# Patient Record
Sex: Male | Born: 1935 | Race: White | Hispanic: No | Smoking: Former smoker
Health system: Southern US, Community
[De-identification: ages and names within clinical notes are randomized; demographics above are authoritative.]

## PROBLEM LIST (undated history)

## (undated) DIAGNOSIS — K5790 Diverticulosis of intestine, part unspecified, without perforation or abscess without bleeding: Secondary | ICD-10-CM

## (undated) DIAGNOSIS — E039 Hypothyroidism, unspecified: Secondary | ICD-10-CM

## (undated) DIAGNOSIS — K635 Polyp of colon: Secondary | ICD-10-CM

## (undated) DIAGNOSIS — I10 Essential (primary) hypertension: Secondary | ICD-10-CM

## (undated) DIAGNOSIS — F0390 Unspecified dementia without behavioral disturbance: Secondary | ICD-10-CM

## (undated) DIAGNOSIS — E785 Hyperlipidemia, unspecified: Secondary | ICD-10-CM

## (undated) DIAGNOSIS — N400 Enlarged prostate without lower urinary tract symptoms: Secondary | ICD-10-CM

## (undated) HISTORY — DX: Hyperlipidemia, unspecified: E78.5

## (undated) HISTORY — PX: TONSILLECTOMY: SUR1361

## (undated) HISTORY — DX: Essential (primary) hypertension: I10

## (undated) HISTORY — PX: CATARACT EXTRACTION: SUR2

## (undated) HISTORY — PX: COLONOSCOPY: SHX174

## (undated) HISTORY — DX: Hypothyroidism, unspecified: E03.9

## (undated) HISTORY — DX: Diverticulosis of intestine, part unspecified, without perforation or abscess without bleeding: K57.90

## (undated) HISTORY — DX: Unspecified dementia, unspecified severity, without behavioral disturbance, psychotic disturbance, mood disturbance, and anxiety: F03.90

## (undated) HISTORY — DX: Benign prostatic hyperplasia without lower urinary tract symptoms: N40.0

## (undated) HISTORY — DX: Polyp of colon: K63.5

---

## 2000-02-04 ENCOUNTER — Ambulatory Visit (HOSPITAL_COMMUNITY): Admission: RE | Admit: 2000-02-04 | Discharge: 2000-02-04 | Payer: Self-pay | Admitting: Gastroenterology

## 2000-02-04 ENCOUNTER — Encounter (INDEPENDENT_AMBULATORY_CARE_PROVIDER_SITE_OTHER): Payer: Self-pay

## 2003-03-01 ENCOUNTER — Ambulatory Visit (HOSPITAL_COMMUNITY): Admission: RE | Admit: 2003-03-01 | Discharge: 2003-03-01 | Payer: Self-pay | Admitting: Gastroenterology

## 2006-03-06 ENCOUNTER — Ambulatory Visit: Payer: Self-pay | Admitting: Family Medicine

## 2006-07-03 ENCOUNTER — Ambulatory Visit: Payer: Self-pay | Admitting: Family Medicine

## 2006-07-03 LAB — CONVERTED CEMR LAB
ALT: 19 units/L (ref 0–40)
AST: 21 units/L (ref 0–37)
Chol/HDL Ratio, serum: 2.7
Cholesterol: 143 mg/dL (ref 0–200)
Glomerular Filtration Rate, Af Am: 93 mL/min/{1.73_m2}
Glucose, Bld: 101 mg/dL — ABNORMAL HIGH (ref 70–99)
HDL: 53.5 mg/dL (ref >39.0)

## 2006-08-18 ENCOUNTER — Ambulatory Visit: Payer: Self-pay | Admitting: Family Medicine

## 2006-08-21 ENCOUNTER — Ambulatory Visit (HOSPITAL_COMMUNITY): Admission: RE | Admit: 2006-08-21 | Discharge: 2006-08-21 | Payer: Self-pay | Admitting: Family Medicine

## 2006-08-21 ENCOUNTER — Ambulatory Visit: Payer: Self-pay | Admitting: Family Medicine

## 2006-08-22 ENCOUNTER — Encounter (INDEPENDENT_AMBULATORY_CARE_PROVIDER_SITE_OTHER): Payer: Self-pay | Admitting: *Deleted

## 2006-09-06 HISTORY — PX: PROSTATE SURGERY: SHX751

## 2006-09-09 ENCOUNTER — Encounter (INDEPENDENT_AMBULATORY_CARE_PROVIDER_SITE_OTHER): Payer: Self-pay | Admitting: Specialist

## 2006-09-09 ENCOUNTER — Encounter (INDEPENDENT_AMBULATORY_CARE_PROVIDER_SITE_OTHER): Payer: Self-pay | Admitting: *Deleted

## 2006-09-09 LAB — CONVERTED CEMR LAB: PSA: ABNORMAL ng/mL

## 2006-09-10 ENCOUNTER — Inpatient Hospital Stay (HOSPITAL_COMMUNITY): Admission: RE | Admit: 2006-09-10 | Discharge: 2006-09-12 | Payer: Self-pay | Admitting: Urology

## 2006-09-10 ENCOUNTER — Ambulatory Visit: Payer: Self-pay | Admitting: Pulmonary Disease

## 2006-10-30 ENCOUNTER — Ambulatory Visit: Payer: Self-pay | Admitting: Family Medicine

## 2006-10-30 LAB — CONVERTED CEMR LAB
ALT: 17 units/L (ref 0–40)
AST: 19 units/L (ref 0–37)
BUN: 21 mg/dL (ref 6–23)
CO2: 31 meq/L (ref 19–32)
Chloride: 101 meq/L (ref 96–112)
Cholesterol: 145 mg/dL (ref 0–200)
Creatinine, Ser: 1 mg/dL (ref 0.4–1.5)
GFR calc Af Amer: 93 mL/min
Glucose, Bld: 102 mg/dL — ABNORMAL HIGH (ref 70–99)
HDL: 55.2 mg/dL (ref >39.0)
Potassium: 4.1 meq/L (ref 3.5–5.1)
Sodium: 138 meq/L (ref 135–145)

## 2007-02-18 ENCOUNTER — Encounter (INDEPENDENT_AMBULATORY_CARE_PROVIDER_SITE_OTHER): Payer: Self-pay | Admitting: *Deleted

## 2007-02-26 ENCOUNTER — Ambulatory Visit: Payer: Self-pay | Admitting: Family Medicine

## 2007-02-26 DIAGNOSIS — I10 Essential (primary) hypertension: Secondary | ICD-10-CM

## 2007-02-26 DIAGNOSIS — E785 Hyperlipidemia, unspecified: Secondary | ICD-10-CM

## 2007-02-26 DIAGNOSIS — K644 Residual hemorrhoidal skin tags: Secondary | ICD-10-CM | POA: Insufficient documentation

## 2007-02-27 ENCOUNTER — Telehealth (INDEPENDENT_AMBULATORY_CARE_PROVIDER_SITE_OTHER): Payer: Self-pay | Admitting: *Deleted

## 2007-02-27 LAB — CONVERTED CEMR LAB
CO2: 33 meq/L — ABNORMAL HIGH (ref 19–32)
Calcium: 9.2 mg/dL (ref 8.4–10.5)
Creatinine, Ser: 1 mg/dL (ref 0.4–1.5)
GFR calc non Af Amer: 77 mL/min
Glucose, Bld: 73 mg/dL (ref 70–99)
Potassium: 4.3 meq/L (ref 3.5–5.1)
Sodium: 138 meq/L (ref 135–145)

## 2007-04-27 ENCOUNTER — Encounter (INDEPENDENT_AMBULATORY_CARE_PROVIDER_SITE_OTHER): Payer: Self-pay | Admitting: Family Medicine

## 2007-05-06 ENCOUNTER — Ambulatory Visit: Payer: Self-pay | Admitting: Family Medicine

## 2007-06-30 ENCOUNTER — Ambulatory Visit: Payer: Self-pay | Admitting: Family Medicine

## 2007-06-30 LAB — CONVERTED CEMR LAB
ALT: 20 units/L (ref 0–53)
Calcium: 9.3 mg/dL (ref 8.4–10.5)
Chloride: 99 meq/L (ref 96–112)
Creatinine, Ser: 1 mg/dL (ref 0.4–1.5)
Glucose, Bld: 99 mg/dL (ref 70–99)
LDL Cholesterol: 88 mg/dL (ref 0–99)
Potassium: 4.1 meq/L (ref 3.5–5.1)
Total CHOL/HDL Ratio: 2.8

## 2007-07-01 ENCOUNTER — Encounter (INDEPENDENT_AMBULATORY_CARE_PROVIDER_SITE_OTHER): Payer: Self-pay | Admitting: *Deleted

## 2007-09-14 ENCOUNTER — Ambulatory Visit: Payer: Self-pay | Admitting: Family Medicine

## 2007-10-26 ENCOUNTER — Encounter: Payer: Self-pay | Admitting: Family Medicine

## 2008-01-19 ENCOUNTER — Ambulatory Visit: Payer: Self-pay | Admitting: Family Medicine

## 2008-01-28 ENCOUNTER — Ambulatory Visit: Payer: Self-pay | Admitting: Family Medicine

## 2008-02-03 ENCOUNTER — Telehealth (INDEPENDENT_AMBULATORY_CARE_PROVIDER_SITE_OTHER): Payer: Self-pay | Admitting: *Deleted

## 2008-02-04 LAB — CONVERTED CEMR LAB
AST: 22 units/L (ref 0–37)
Albumin: 3.9 g/dL (ref 3.5–5.2)
Alkaline Phosphatase: 68 units/L (ref 39–117)
BUN: 23 mg/dL (ref 6–23)
GFR calc non Af Amer: 69 mL/min
Glucose, Bld: 98 mg/dL (ref 70–99)
LDL Cholesterol: 89 mg/dL (ref 0–99)
Potassium: 3.9 meq/L (ref 3.5–5.1)
Sodium: 136 meq/L (ref 135–145)
Triglycerides: 85 mg/dL (ref 0–149)

## 2008-03-11 ENCOUNTER — Ambulatory Visit: Payer: Self-pay | Admitting: Family Medicine

## 2008-04-14 ENCOUNTER — Encounter: Payer: Self-pay | Admitting: Family Medicine

## 2008-05-04 ENCOUNTER — Ambulatory Visit: Payer: Self-pay | Admitting: Family Medicine

## 2008-05-23 ENCOUNTER — Ambulatory Visit: Payer: Self-pay | Admitting: Family Medicine

## 2008-05-23 DIAGNOSIS — R972 Elevated prostate specific antigen [PSA]: Secondary | ICD-10-CM

## 2008-05-24 ENCOUNTER — Encounter (INDEPENDENT_AMBULATORY_CARE_PROVIDER_SITE_OTHER): Payer: Self-pay | Admitting: *Deleted

## 2008-05-24 LAB — CONVERTED CEMR LAB
ALT: 19 units/L (ref 0–53)
AST: 20 units/L (ref 0–37)
Alkaline Phosphatase: 57 units/L (ref 39–117)
BUN: 22 mg/dL (ref 6–23)
CO2: 30 meq/L (ref 19–32)
CRP, High Sensitivity: 2 (ref 0.00–5.00)
Calcium: 8.9 mg/dL (ref 8.4–10.5)
Creatinine, Ser: 1.1 mg/dL (ref 0.4–1.5)
GFR calc non Af Amer: 68 mL/min
Glucose, Bld: 93 mg/dL (ref 70–99)
PSA: 3.04 ng/mL (ref 0.10–4.00)
Potassium: 4.3 meq/L (ref 3.5–5.1)
Sodium: 138 meq/L (ref 135–145)
Total Bilirubin: 1.2 mg/dL (ref 0.3–1.2)
Triglycerides: 58 mg/dL (ref 0–149)
VLDL: 12 mg/dL (ref 0–40)

## 2008-07-13 ENCOUNTER — Telehealth: Payer: Self-pay | Admitting: Family Medicine

## 2008-07-18 HISTORY — PX: CATARACT EXTRACTION: SUR2

## 2008-10-17 ENCOUNTER — Ambulatory Visit: Payer: Self-pay | Admitting: Family Medicine

## 2008-10-17 DIAGNOSIS — J309 Allergic rhinitis, unspecified: Secondary | ICD-10-CM | POA: Insufficient documentation

## 2008-11-28 ENCOUNTER — Ambulatory Visit: Payer: Self-pay | Admitting: Family Medicine

## 2008-11-29 ENCOUNTER — Encounter (INDEPENDENT_AMBULATORY_CARE_PROVIDER_SITE_OTHER): Payer: Self-pay | Admitting: *Deleted

## 2008-11-29 LAB — CONVERTED CEMR LAB
Bilirubin, Direct: 0 mg/dL (ref 0.0–0.3)
Chloride: 103 meq/L (ref 96–112)
Cholesterol: 162 mg/dL (ref 0–200)
GFR calc non Af Amer: 68.34 mL/min (ref >60)
HDL: 51.2 mg/dL (ref >39.00)
LDL Cholesterol: 95 mg/dL (ref 0–99)
Potassium: 3.9 meq/L (ref 3.5–5.1)
Total Bilirubin: 1.3 mg/dL — ABNORMAL HIGH (ref 0.3–1.2)
Total Protein: 7 g/dL (ref 6.0–8.3)
Triglycerides: 80 mg/dL (ref 0.0–149.0)

## 2008-12-29 ENCOUNTER — Encounter: Payer: Self-pay | Admitting: Family Medicine

## 2008-12-29 LAB — HM COLONOSCOPY

## 2009-01-05 ENCOUNTER — Encounter: Payer: Self-pay | Admitting: Family Medicine

## 2009-04-13 ENCOUNTER — Encounter: Payer: Self-pay | Admitting: Family Medicine

## 2009-04-24 ENCOUNTER — Ambulatory Visit: Payer: Self-pay | Admitting: Family Medicine

## 2009-05-29 ENCOUNTER — Telehealth: Payer: Self-pay | Admitting: Family Medicine

## 2009-06-07 ENCOUNTER — Telehealth (INDEPENDENT_AMBULATORY_CARE_PROVIDER_SITE_OTHER): Payer: Self-pay | Admitting: *Deleted

## 2009-06-13 ENCOUNTER — Telehealth: Payer: Self-pay | Admitting: Family Medicine

## 2009-06-27 ENCOUNTER — Ambulatory Visit: Payer: Self-pay | Admitting: Family Medicine

## 2009-06-27 LAB — CONVERTED CEMR LAB
ALT: 21 U/L
AST: 24 U/L
Albumin: 3.8 g/dL
Alkaline Phosphatase: 63 U/L
BUN: 17 mg/dL
Bilirubin Urine: NEGATIVE
Bilirubin, Direct: 0.1 mg/dL
Blood in Urine, dipstick: NEGATIVE
CO2: 31 meq/L
Calcium: 9 mg/dL
Chloride: 99 meq/L
Cholesterol: 151 mg/dL
Creatinine, Ser: 1.1 mg/dL
GFR calc non Af Amer: 68.24 mL/min
Glucose, Bld: 98 mg/dL
Glucose, Urine, Semiquant: NEGATIVE
HDL: 55.2 mg/dL
Ketones, urine, test strip: NEGATIVE
LDL Cholesterol: 83 mg/dL
Nitrite: NEGATIVE
Potassium: 4 meq/L
Protein, U semiquant: NEGATIVE
Sodium: 139 meq/L
Specific Gravity, Urine: 1.015
Total Bilirubin: 1.1 mg/dL
Total CHOL/HDL Ratio: 3
Total Protein: 6.9 g/dL
Triglycerides: 63 mg/dL
Urobilinogen, UA: 0.2
VLDL: 12.6 mg/dL
WBC Urine, dipstick: NEGATIVE
pH: 6.5

## 2009-12-26 ENCOUNTER — Ambulatory Visit: Payer: Self-pay | Admitting: Family Medicine

## 2009-12-27 ENCOUNTER — Encounter: Payer: Self-pay | Admitting: Family Medicine

## 2009-12-27 LAB — CONVERTED CEMR LAB
Albumin: 4 g/dL (ref 3.5–5.2)
Basophils Absolute: 0 10*3/uL (ref 0.0–0.1)
Basophils Relative: 0.3 % (ref 0.0–3.0)
Bilirubin, Direct: 0.2 mg/dL (ref 0.0–0.3)
CO2: 31 meq/L (ref 19–32)
Calcium: 9.2 mg/dL (ref 8.4–10.5)
Chloride: 101 meq/L (ref 96–112)
Eosinophils Relative: 2.6 % (ref 0.0–5.0)
GFR calc non Af Amer: 75.21 mL/min (ref >60)
Hemoglobin: 16.2 g/dL (ref 13.0–17.0)
Lymphocytes Relative: 17.8 % (ref 12.0–46.0)
MCHC: 33.8 g/dL (ref 30.0–36.0)
MCV: 88.5 fL (ref 78.0–100.0)
Platelets: 185 10*3/uL (ref 150.0–400.0)
Sodium: 139 meq/L (ref 135–145)
Total Protein: 6.9 g/dL (ref 6.0–8.3)
Triglycerides: 64 mg/dL (ref 0.0–149.0)

## 2010-01-19 ENCOUNTER — Encounter: Payer: Self-pay | Admitting: Family Medicine

## 2010-05-22 ENCOUNTER — Encounter: Payer: Self-pay | Admitting: Family Medicine

## 2010-05-22 ENCOUNTER — Ambulatory Visit: Payer: Self-pay | Admitting: Family Medicine

## 2010-05-22 DIAGNOSIS — D239 Other benign neoplasm of skin, unspecified: Secondary | ICD-10-CM | POA: Insufficient documentation

## 2010-05-22 DIAGNOSIS — F329 Major depressive disorder, single episode, unspecified: Secondary | ICD-10-CM

## 2010-05-23 ENCOUNTER — Telehealth (INDEPENDENT_AMBULATORY_CARE_PROVIDER_SITE_OTHER): Payer: Self-pay | Admitting: *Deleted

## 2010-05-27 LAB — CONVERTED CEMR LAB
ALT: 19 units/L (ref 0–53)
AST: 21 units/L (ref 0–37)
Albumin: 4.2 g/dL (ref 3.5–5.2)
BUN: 20 mg/dL (ref 6–23)
Basophils Absolute: 0 10*3/uL (ref 0.0–0.1)
CO2: 28 meq/L (ref 19–32)
Eosinophils Absolute: 0.2 10*3/uL (ref 0.0–0.7)
Eosinophils Relative: 2.1 % (ref 0.0–5.0)
HCT: 47.8 % (ref 39.0–52.0)
HDL: 55.8 mg/dL (ref >39.00)
LDL Cholesterol: 112 mg/dL — ABNORMAL HIGH (ref 0–99)
Lymphocytes Relative: 20.1 % (ref 12.0–46.0)
MCV: 88.1 fL (ref 78.0–100.0)
Monocytes Absolute: 0.7 10*3/uL (ref 0.1–1.0)
Monocytes Relative: 7.7 % (ref 3.0–12.0)
Potassium: 4.2 meq/L (ref 3.5–5.1)
RBC: 5.43 M/uL (ref 4.22–5.81)
Total CHOL/HDL Ratio: 3
Triglycerides: 66 mg/dL (ref 0.0–149.0)
VLDL: 13.2 mg/dL (ref 0.0–40.0)

## 2010-05-29 ENCOUNTER — Encounter: Payer: Self-pay | Admitting: Family Medicine

## 2010-07-04 ENCOUNTER — Encounter: Payer: Self-pay | Admitting: Family Medicine

## 2010-07-10 ENCOUNTER — Ambulatory Visit
Admission: RE | Admit: 2010-07-10 | Discharge: 2010-07-10 | Payer: Self-pay | Source: Home / Self Care | Attending: Family Medicine | Admitting: Family Medicine

## 2010-07-23 ENCOUNTER — Encounter: Payer: Self-pay | Admitting: Family Medicine

## 2010-07-29 ENCOUNTER — Encounter: Payer: Self-pay | Admitting: Family Medicine

## 2010-08-07 NOTE — Miscellaneous (Signed)
Summary: BLUE MEDICARE  BLUE MEDICARE   Imported By: Freddy Jaksch 12/26/2009 10:09:33  _____________________________________________________________________  External Attachment:    Type:   Image     Comment:   External Document

## 2010-08-07 NOTE — Assessment & Plan Note (Signed)
Summary: yearly check/cbs   Vital Signs:  Patient profile:   77 year old male Height:      65.5 inches Weight:      209 pounds Temp:     98.0 degrees F oral Pulse rate:   76 / minute Resp:     18 per minute BP sitting:   150 / 86  (left arm)  Vitals Entered By: Jeremy Johann CMA (May 22, 2010 1:04 PM) CC: yearly, NPO since 7:30, flu shot  Does patient need assistance? Functional Status Self care, Cook/clean, Shopping, Social activities Ambulation Normal Comments No BP med today Pt is able to do all ADLS and can read and write.  Vision Screening:      Vision Comments: optho q1y 40db HL: Left  Right  Audiometry Comment: grossly normal    CC:  yearly, NPO since 7:30, and flu shot.  History of Present Illness: Pt here with his son for CPE.  No complaints.   Pt son is present.  Family feels that he is depressed.   He is withdrawing some.   optho--Dr Hazle [facility] urology--[facility]   Preventive Screening-Counseling & Management  Alcohol-Tobacco     Alcohol drinks/day: 1     Alcohol type: spirits     Smoking Status: quit > 6 months     Smoke Cessation Stage: quit     Packs/Day: 1.0     Year Started: 1942     Year Quit: 1964  Caffeine-Diet-Exercise     Caffeine use/day: 2.5     Does Patient Exercise: yes     Type of exercise: tennis, walk     Times/week: 2  Hep-HIV-STD-Contraception     Dental Visit-last 6 months yes     Dental Care Counseling: not indicated; dental care within six months  Safety-Violence-Falls     Firearms in the Home: no firearms in the home     Smoke Detectors: yes     Violence in the Home: no risk noted     Sexual Abuse: no     Fall Risk: no      Sexual History:  widow.    Current Medications (verified): 1)  Lipitor 10 Mg  Tabs (Atorvastatin Calcium) .Marland Kitchen.. 1 By Mouth Pm 2)  Hydrochlorothiazide 25 Mg  Tabs (Hydrochlorothiazide) .Marland Kitchen.. 1 Daily 3)  Adult Aspirin Ec Low Strength 81 Mg  Tbec (Aspirin) 4)  Zoloft 50 Mg Tabs (Sertraline  Hcl) .Marland Kitchen.. 1 By Mouth Once Daily  Allergies (verified): No Known Drug Allergies  Past History:  Past Medical History: Last updated: 05/23/2008 Hyperlipidemia Hypertension Current Problems:  HEMORRHOIDS, EXTERNAL (ICD-455.3) HYPERTENSION (ICD-401.9) HYPERLIPIDEMIA (ICD-272.4)  Past Surgical History: Last updated: 10/17/2008 Prostatectomy (3/08) Cataract extraction (07/18/2008)--- Left  Social History: Last updated: 12/26/2009  Widow/Widower  Risk Factors: Alcohol Use: 1 (05/22/2010) Caffeine Use: 2.5 (05/22/2010) Exercise: yes (05/22/2010)  Risk Factors: Smoking Status: quit > 6 months (05/22/2010) Packs/Day: 1.0 (05/22/2010)  Social History: Caffeine use/day:  2.5 Dental Care w/in 6 mos.:  yes Fall Risk:  no  Review of Systems      See HPI General:  Denies chills, fatigue, fever, loss of appetite, malaise, sleep disorder, sweats, weakness, and weight loss. Eyes:  Denies blurring, discharge, double vision, eye irritation, eye pain, halos, itching, light sensitivity, red eye, vision loss-1 eye, and vision loss-both eyes. ENT:  Denies decreased hearing, difficulty swallowing, ear discharge, earache, hoarseness, nasal congestion, nosebleeds, postnasal drainage, ringing in ears, sinus pressure, and sore throat. CV:  Denies bluish discoloration of lips or  nails, chest pain or discomfort, difficulty breathing at night, difficulty breathing while lying down, fainting, fatigue, leg cramps with exertion, lightheadness, near fainting, palpitations, shortness of breath with exertion, swelling of feet, swelling of hands, and weight gain. Resp:  Denies chest discomfort, chest pain with inspiration, cough, coughing up blood, excessive snoring, hypersomnolence, morning headaches, pleuritic, shortness of breath, sputum productive, and wheezing. GI:  Denies abdominal pain, bloody stools, change in bowel habits, constipation, dark tarry stools, diarrhea, excessive appetite, gas,  hemorrhoids, indigestion, loss of appetite, nausea, vomiting, vomiting blood, and yellowish skin color. GU:  Denies decreased libido, discharge, dysuria, erectile dysfunction, genital sores, hematuria, incontinence, nocturia, urinary frequency, and urinary hesitancy. MS:  Denies joint pain, joint redness, joint swelling, loss of strength, low back pain, mid back pain, muscle aches, muscle , cramps, muscle weakness, stiffness, and thoracic pain. Derm:  Denies changes in color of skin, changes in nail beds, dryness, excessive perspiration, flushing, hair loss, insect bite(s), itching, lesion(s), poor wound healing, and rash. Neuro:  Denies brief paralysis, difficulty with concentration, disturbances in coordination, falling down, headaches, inability to speak, memory loss, numbness, poor balance, seizures, sensation of room spinning, tingling, tremors, visual disturbances, and weakness. Psych:  Complains of depression; denies alternate hallucination ( auditory/visual), anxiety, easily angered, easily tearful, irritability, mental problems, panic attacks, sense of great danger, suicidal thoughts/plans, thoughts of violence, unusual visions or sounds, and thoughts /plans of harming others. Endo:  Denies cold intolerance, excessive hunger, excessive thirst, excessive urination, heat intolerance, polyuria, and weight change. Heme:  Denies abnormal bruising, bleeding, enlarge lymph nodes, fevers, pallor, and skin discoloration. Allergy:  Denies hives or rash, itching eyes, persistent infections, seasonal allergies, and sneezing.  Physical Exam  General:  Well-developed,well-nourished,in no acute distress; alert,appropriate and cooperative throughout examination Head:  Normocephalic and atraumatic without obvious abnormalities. No apparent alopecia or balding. Eyes:  vision grossly intact, pupils equal, pupils round, pupils reactive to light, and no injection.   Ears:  ? keratosis R ear  Nose:  External  nasal examination shows no deformity or inflammation. Nasal mucosa are pink and moist without lesions or exudates. Mouth:  Oral mucosa and oropharynx without lesions or exudates.  Teeth in good repair. Neck:  No deformities, masses, or tenderness noted. Chest Wall:  No deformities, masses, tenderness or gynecomastia noted. Lungs:  Normal respiratory effort, chest expands symmetrically. Lungs are clear to auscultation, no crackles or wheezes. Heart:  normal rate and no murmur.   Abdomen:  small ventral hernia soft.   Rectal:  urology Genitalia:  urology Prostate:  urology Msk:  normal ROM, no joint tenderness, no joint swelling, no joint warmth, no redness over joints, no joint deformities, no joint instability, and no crepitation.   Pulses:  R and L carotid,radial,femoral,dorsalis pedis and posterior tibial pulses are full and equal bilaterally Extremities:  No clubbing, cyanosis, edema, or deformity noted with normal full range of motion of all joints.   Neurologic:  No cranial nerve deficits noted. Station and gait are normal. Plantar reflexes are down-going bilaterally. DTRs are symmetrical throughout. Sensory, motor and coordinative functions appear intact. Skin:  Intact without suspicious lesions or rashes Cervical Nodes:  No lymphadenopathy noted Axillary Nodes:  No palpable lymphadenopathy Psych:  Cognition and judgment appear intact. Alert and cooperative with normal attention span and concentration. No apparent delusions, illusions, hallucinations   Impression & Recommendations:  Problem # 1:  PREVENTIVE HEALTH CARE (ICD-V70.0)  Orders: Venipuncture (78295) TLB-Lipid Panel (80061-LIPID) TLB-BMP (Basic Metabolic Panel-BMET) (80048-METABOL) TLB-CBC Platelet -  w/Differential (85025-CBCD) TLB-Hepatic/Liver Function Pnl (80076-HEPATIC) TLB-PSA (Prostate Specific Antigen) (84153-PSA) Specimen Handling (57846) Medicare -1st Annual Wellness Visit [phone]) EKG w/ Interpretation  (93000)  Reviewed preventive care protocols, scheduled due services, and updated immunizations.  Problem # 2:  MOLE (ICD-216.9)  Orders: Dermatology Referral (Derma)  Problem # 3:  HYPERTENSION (ICD-401.9)  His updated medication list for this problem includes:    Hydrochlorothiazide 25 Mg Tabs (Hydrochlorothiazide) .Marland Kitchen... 1 daily  Orders: Venipuncture (28413) TLB-Lipid Panel (80061-LIPID) TLB-BMP (Basic Metabolic Panel-BMET) (80048-METABOL) TLB-CBC Platelet - w/Differential (85025-CBCD) TLB-Hepatic/Liver Function Pnl (80076-HEPATIC) TLB-PSA (Prostate Specific Antigen) (84153-PSA) Specimen Handling (24401)  BP today: 150/86 Prior BP: 124/80 (12/26/2009)  Labs Reviewed: K+: 4.5 (12/26/2009) Creat: : 1.0 (12/26/2009)   Chol: 157 (12/26/2009)   HDL: 55.50 (12/26/2009)   LDL: 89 (12/26/2009)   TG: 64.0 (12/26/2009)  Problem # 4:  HYPERLIPIDEMIA (ICD-272.4)  His updated medication list for this problem includes:    Lipitor 10 Mg Tabs (Atorvastatin calcium) .Marland Kitchen... 1 by mouth pm  Orders: Venipuncture (02725) TLB-Lipid Panel (80061-LIPID) TLB-BMP (Basic Metabolic Panel-BMET) (80048-METABOL) TLB-CBC Platelet - w/Differential (85025-CBCD) TLB-Hepatic/Liver Function Pnl (80076-HEPATIC) TLB-PSA (Prostate Specific Antigen) (84153-PSA) Specimen Handling (36644)  Labs Reviewed: SGOT: 20 (12/26/2009)   SGPT: 21 (12/26/2009)   HDL:55.50 (12/26/2009), 55.20 (06/27/2009)  LDL:89 (12/26/2009), 83 (06/27/2009)  Chol:157 (12/26/2009), 151 (06/27/2009)  Trig:64.0 (12/26/2009), 63.0 (06/27/2009)  Problem # 5:  PSA, INCREASED (ICD-790.93) per urology Orders: Venipuncture (03474) TLB-Lipid Panel (80061-LIPID) TLB-BMP (Basic Metabolic Panel-BMET) (80048-METABOL) TLB-CBC Platelet - w/Differential (85025-CBCD) TLB-Hepatic/Liver Function Pnl (80076-HEPATIC) TLB-PSA (Prostate Specific Antigen) (84153-PSA) Specimen Handling (25956)  Problem # 6:  DEPRESSIVE DISORDER (ICD-311)  His updated  medication list for this problem includes:    Zoloft 50 Mg Tabs (Sertraline hcl) .Marland Kitchen... 1 by mouth once daily  Complete Medication List: 1)  Lipitor 10 Mg Tabs (Atorvastatin calcium) .Marland Kitchen.. 1 by mouth pm 2)  Hydrochlorothiazide 25 Mg Tabs (Hydrochlorothiazide) .Marland Kitchen.. 1 daily 3)  Adult Aspirin Ec Low Strength 81 Mg Tbec (Aspirin) 4)  Zoloft 50 Mg Tabs (Sertraline hcl) .Marland Kitchen.. 1 by mouth once daily  Other Orders: Flu Vaccine 86yrs + MEDICARE PATIENTS (L8756) Administration Flu vaccine - MCR (E3329)  Patient Instructions: 1)  take 1/2 zoloft daily for 8 days then go up to 1 tab daily 2)  f/u here 4-6 weeks  Prescriptions: ZOLOFT 50 MG TABS (SERTRALINE HCL) 1 by mouth once daily  #30 x 2   Entered and Authorized by:   Loreen Freud DO   Signed by:   Loreen Freud DO on 05/22/2010   Method used:   Electronically to        [facility] [address] (retail)       [address]       [place], [place]  [address]       Ph: [phone] or [phone]       Fax: [phone]   RxID:   [id number]    Orders Added: 1)  Flu Vaccine 48yrs + MEDICARE PATIENTS [Q2039] 2)  Administration Flu vaccine - MCR [G0008] 3)  Venipuncture [36415] 4)  TLB-Lipid Panel [80061-LIPID] 5)  TLB-BMP (Basic Metabolic Panel-BMET) [80048-METABOL] 6)  TLB-CBC Platelet - w/Differential [85025-CBCD] 7)  TLB-Hepatic/Liver Function Pnl [80076-HEPATIC] 8)  TLB-PSA (Prostate Specific Antigen) [37106-YIR] 9)  Dermatology Referral [Derma] 10)  Specimen Handling [99000] 11)  Medicare -1st Annual Wellness Visit [G0438] 12)  EKG w/ Interpretation [93000] 13)  Est. Patient Level III [48546]  Flu Vaccine Consent Questions     Do you have a history of severe allergic reactions to this vaccine? no    Any prior history of allergic reactions to egg and/or gelatin? no    Do you have a sensitivity to the preservative Thimersol? no    Do you have a past history of Guillan-Barre Syndrome? no    Do you currently  have an acute febrile illness? no    Have you ever had a severe reaction to latex? no    Vaccine information given and explained to patient? yes    Are you currently pregnant? no    Lot Number:[id number]   Exp Date:01/05/2011   Manufacturer: [employer]    Site Given  Left Deltoid IMded: 1)  Flu Vaccine 33yrs + MEDICARE PATIENTS [Q2039] 2)  Administration Flu vaccine - MCR [G0008]   .medflu  Pneumovax Result Date:  04/24/1999 Pneumovax Result:  given Pneumovax Next Due:  Not Indicated Last Colonoscopy:  Diverticulosis (03/01/2003 9:21:54 AM) Colonoscopy Result Date:  12/29/2008 Colonoscopy Result:  polyps Colonoscopy Next Due:  5 yr  Appended Document: yearly check/cbs BMI- 34.24

## 2010-08-07 NOTE — Letter (Signed)
Summary: Letter to Patient Regarding Path Results/[facility]   Letter to Patient Regarding Path Results/[facility]   Imported By: Lanelle Bal 06/01/2010 10:37:16  _____________________________________________________________________  External Attachment:    Type:   Image     Comment:   External Document

## 2010-08-07 NOTE — Letter (Signed)
Summary: [facility]  [facility]   Imported By: Lanelle Bal 06/01/2010 10:38:08  _____________________________________________________________________  External Attachment:    Type:   Image     Comment:   External Document

## 2010-08-07 NOTE — Progress Notes (Signed)
Summary: Pt does not want to go to Dermatologist  Phone Note Call from Patient   Caller: Patient Call For: Loreen Freud DO Details for Reason: Pt does not want Dermatology Referral Summary of Call: Mssg from patient stating he did not want to see the dermatologist, says he did not want to do anything about the blotches on his face. C/B # [id number].... Almeta Monas CMA Duncan Dull)  May 23, 2010 5:04 PM   Initial call taken by: Almeta Monas CMA Duncan Dull),  May 23, 2010 5:05 PM  Follow-up for Phone Call        I was concerned about the one on his ear and felt a derm should look at it. Follow-up by: Loreen Freud DO,  May 24, 2010 8:59 AM  Additional Follow-up for Phone Call Additional follow up Details #1::        spoke w/ patient says that he has appt information and he is going to see dermatology.....Marland KitchenMarland KitchenDoristine Devoid CMA  May 24, 2010 5:00 PM

## 2010-08-07 NOTE — Miscellaneous (Signed)
Summary: Zostavax/[facility]  [facility]   Imported By: Lanelle Bal 01/04/2010 12:59:48  _____________________________________________________________________  External Attachment:    Type:   Image     Comment:   External Document  Appended Document: [facility]    Clinical Lists Changes  Observations: Added new observation of ZOSTAVAX: Zostavax (12/27/2009 9:21)       Immunization History:  Zostavax History:    Zostavax # 1:  Zostavax (12/27/2009)

## 2010-08-07 NOTE — Letter (Signed)
Summary: [facility]  [facility]   Imported By: Lanelle Bal 01/25/2010 12:31:14  _____________________________________________________________________  External Attachment:    Type:   Image     Comment:   External Document

## 2010-08-07 NOTE — Assessment & Plan Note (Signed)
Summary: 6 MTH FU/NS/KDC   Vital Signs:  Patient profile:   77 year old male Height:      65 inches Weight:      207 pounds BMI:     34.57 Pulse rate:   64 / minute Pulse rhythm:   regular BP sitting:   124 / 80  (left arm) Cuff size:   large  Vitals Entered By: Army Fossa CMA (December 26, 2009 8:10 AM) CC: Pt here for 6 month follow up   History of Present Illness:  Hyperlipidemia follow-up      This is an 77 year old man who presents for Hyperlipidemia follow-up.  The patient denies muscle aches, GI upset, abdominal pain, flushing, itching, constipation, diarrhea, and fatigue.  The patient denies the following symptoms: chest pain/pressure, exercise intolerance, dypsnea, palpitations, syncope, and pedal edema.  Compliance with medications (by patient report) has been near 100%.  Dietary compliance has been good.  The patient reports exercising 3-4X per week.  Adjunctive measures currently used by the patient include ASA.    Hypertension follow-up      The patient also presents for Hypertension follow-up.  The patient denies lightheadedness, urinary frequency, headaches, edema, impotence, rash, and fatigue.  The patient denies the following associated symptoms: chest pain, chest pressure, exercise intolerance, dyspnea, palpitations, syncope, leg edema, and pedal edema.  Compliance with medications (by patient report) has been near 100%.  The patient reports that dietary compliance has been good.  The patient reports exercising 3-4X per week.  Adjunctive measures currently used by the patient include salt restriction.    Preventive Screening-Counseling & Management  Alcohol-Tobacco     Smoking Status: quit > 6 months     Smoke Cessation Stage: quit     Packs/Day: 1.0     Year Started: 1942     Year Quit: 1964  Caffeine-Diet-Exercise     Does Patient Exercise: yes     Type of exercise: tennis, walk     Times/week: 3      Sexual History:  widow.    Current Medications  (verified): 1)  Lipitor 10 Mg  Tabs (Atorvastatin Calcium) .Marland Kitchen.. 1 By Mouth Pm 2)  Hydrochlorothiazide 25 Mg  Tabs (Hydrochlorothiazide) .Marland Kitchen.. 1 Daily 3)  Adult Aspirin Ec Low Strength 81 Mg  Tbec (Aspirin) 4)  Zostavax 04540 Unt/0.15ml Solr (Zoster Vaccine Live) .Marland Kitchen.. 1 Ml Im X1  Allergies (verified): No Known Drug Allergies  Past History:  Past medical, surgical, family and social histories (including risk factors) reviewed for relevance to current acute and chronic problems.  Past Medical History: Reviewed history from 05/23/2008 and no changes required. Hyperlipidemia Hypertension Current Problems:  HEMORRHOIDS, EXTERNAL (ICD-455.3) HYPERTENSION (ICD-401.9) HYPERLIPIDEMIA (ICD-272.4)  Past Surgical History: Reviewed history from 10/17/2008 and no changes required. Prostatectomy (3/08) Cataract extraction (07/18/2008)--- Left  Family History: Reviewed history and no changes required.  Social History: Reviewed history from 05/23/2008 and no changes required.  Widow/Widower Does Patient Exercise:  yes Smoking Status:  quit > 6 months Packs/Day:  1.0 Sexual History:  widow  Review of Systems      See HPI  Physical Exam  General:  Well-developed,well-nourished,in no acute distress; alert,appropriate and cooperative throughout examination Lungs:  Normal respiratory effort, chest expands symmetrically. Lungs are clear to auscultation, no crackles or wheezes. Heart:  normal rate and no murmur.   Extremities:  No clubbing, cyanosis, edema, or deformity noted with normal full range of motion of all joints.   Psych:  Oriented X3  and normally interactive.     Impression & Recommendations:  Problem # 1:  HYPERTENSION (ICD-401.9)  His updated medication list for this problem includes:    Hydrochlorothiazide 25 Mg Tabs (Hydrochlorothiazide) .Marland Kitchen... 1 daily  Orders: Venipuncture (16109) TLB-Lipid Panel (80061-LIPID) TLB-BMP (Basic Metabolic Panel-BMET)  (80048-METABOL) TLB-CBC Platelet - w/Differential (85025-CBCD) TLB-Hepatic/Liver Function Pnl (80076-HEPATIC)  BP today: 124/80 Prior BP: 122/80 (06/27/2009)  Labs Reviewed: K+: 4.0 (06/27/2009) Creat: : 1.1 (06/27/2009)   Chol: 151 (06/27/2009)   HDL: 55.20 (06/27/2009)   LDL: 83 (06/27/2009)   TG: 63.0 (06/27/2009)  Problem # 2:  HYPERLIPIDEMIA (ICD-272.4)  His updated medication list for this problem includes:    Lipitor 10 Mg Tabs (Atorvastatin calcium) .Marland Kitchen... 1 by mouth pm  Orders: Venipuncture (60454) TLB-Lipid Panel (80061-LIPID) TLB-BMP (Basic Metabolic Panel-BMET) (80048-METABOL) TLB-CBC Platelet - w/Differential (85025-CBCD) TLB-Hepatic/Liver Function Pnl (80076-HEPATIC)  Labs Reviewed: SGOT: 24 (06/27/2009)   SGPT: 21 (06/27/2009)   HDL:55.20 (06/27/2009), 51.20 (11/28/2008)  LDL:83 (06/27/2009), 95 (11/28/2008)  Chol:151 (06/27/2009), 162 (11/28/2008)  Trig:63.0 (06/27/2009), 80.0 (11/28/2008)  Problem # 3:  PSA, INCREASED (ICD-790.93) per urology  Complete Medication List: 1)  Lipitor 10 Mg Tabs (Atorvastatin calcium) .Marland Kitchen.. 1 by mouth pm 2)  Hydrochlorothiazide 25 Mg Tabs (Hydrochlorothiazide) .Marland Kitchen.. 1 daily 3)  Adult Aspirin Ec Low Strength 81 Mg Tbec (Aspirin) 4)  Zostavax 09811 Unt/0.27ml Solr (Zoster vaccine live) .Marland Kitchen.. 1 ml im x1  Patient Instructions: 1)  Please schedule a follow-up appointment in 6 months for complete medicare physical Prescriptions: ZOSTAVAX 91478 UNT/0.65ML SOLR (ZOSTER VACCINE LIVE) 1 ml IM x1  #1 x 0   Entered and Authorized by:   Loreen Freud DO   Signed by:   Loreen Freud DO on 12/26/2009   Method used:   Print then Give to Patient   RxID:   [id number]

## 2010-08-09 NOTE — Letter (Signed)
Summary: [facility]  [facility]   Imported By: Lanelle Bal 08/03/2010 11:53:04  _____________________________________________________________________  External Attachment:    Type:   Image     Comment:   External Document

## 2010-08-09 NOTE — Consult Note (Signed)
Summary: Duard Larsen MD Dermatology  Duard Larsen MD Dermatology   Imported By: Lanelle Bal 07/17/2010 08:59:34  _____________________________________________________________________  External Attachment:    Type:   Image     Comment:   External Document

## 2010-08-09 NOTE — Letter (Signed)
Summary: Duard Larsen MD Dermatology  Duard Larsen MD Dermatology   Imported By: Lanelle Bal 07/17/2010 09:00:42  _____________________________________________________________________  External Attachment:    Type:   Image     Comment:   External Document

## 2010-08-09 NOTE — Assessment & Plan Note (Signed)
Summary: rto 6 weeks/cbs   Vital Signs:  Patient profile:   77 year old male Height:      65.5 inches Weight:      210 pounds Temp:     98.3 degrees F oral Pulse rate:   72 / minute BP sitting:   150 / 82  (left arm)  Vitals Entered By: Jeremy Johann CMA (July 10, 2010 1:03 PM) CC: f/u med   History of Present Illness: pt here to f/u zoloft.  Pt doesn't seem to notice a difference.  He is doing a little more now.  Pt still plays tennis 1x a week.  He has not been back to nursing home since his wife died.  They asked to see him but he doesn't think he can do that.    Current Medications (verified): 1)  Lipitor 10 Mg  Tabs (Atorvastatin Calcium) .Marland Kitchen.. 1 By Mouth Pm 2)  Hydrochlorothiazide 25 Mg  Tabs (Hydrochlorothiazide) .Marland Kitchen.. 1 Daily 3)  Adult Aspirin Ec Low Strength 81 Mg  Tbec (Aspirin) 4)  Zoloft 50 Mg Tabs (Sertraline Hcl) .Marland Kitchen.. 1 By Mouth Once Daily  Allergies (verified): No Known Drug Allergies  Past History:  Past Medical History: Last updated: 05/23/2008 Hyperlipidemia Hypertension Current Problems:  HEMORRHOIDS, EXTERNAL (ICD-455.3) HYPERTENSION (ICD-401.9) HYPERLIPIDEMIA (ICD-272.4)  Past Surgical History: Last updated: 10/17/2008 Prostatectomy (3/08) Cataract extraction (07/18/2008)--- Left  Social History: Last updated: 12/26/2009  Widow/Widower  Risk Factors: Alcohol Use: 1 (05/22/2010) Caffeine Use: 2.5 (05/22/2010) Exercise: yes (05/22/2010)  Risk Factors: Smoking Status: quit > 6 months (05/22/2010) Packs/Day: 1.0 (05/22/2010)  Family History: Reviewed history and no changes required.  Social History: Reviewed history from 12/26/2009 and no changes required.  Widow/Widower  Review of Systems      See HPI  Physical Exam  General:  Well-developed,well-nourished,in no acute distress; alert,appropriate and cooperative throughout examination Psych:  Oriented X3, normally interactive, good eye contact, not anxious appearing, and not  depressed appearing.     Impression & Recommendations:  Problem # 1:  DEPRESSIVE DISORDER (ICD-311) Assessment Unchanged  His updated medication list for this problem includes:    Zoloft 50 Mg Tabs (Sertraline hcl) .Marland Kitchen... 1 by mouth once daily con't med for now---reevaluate in another 6-8 weeks.    Complete Medication List: 1)  Lipitor 10 Mg Tabs (Atorvastatin calcium) .Marland Kitchen.. 1 by mouth pm 2)  Hydrochlorothiazide 25 Mg Tabs (Hydrochlorothiazide) .Marland Kitchen.. 1 daily 3)  Adult Aspirin Ec Low Strength 81 Mg Tbec (Aspirin) 4)  Zoloft 50 Mg Tabs (Sertraline hcl) .Marland Kitchen.. 1 by mouth once daily  Patient Instructions: 1)  Please schedule a follow-up appointment in 2 months --- or sooner as needed    Orders Added: 1)  Est. Patient Level III [81191]

## 2010-09-10 ENCOUNTER — Telehealth (INDEPENDENT_AMBULATORY_CARE_PROVIDER_SITE_OTHER): Payer: Self-pay | Admitting: *Deleted

## 2010-09-10 ENCOUNTER — Encounter: Payer: Self-pay | Admitting: Family Medicine

## 2010-09-10 ENCOUNTER — Ambulatory Visit (INDEPENDENT_AMBULATORY_CARE_PROVIDER_SITE_OTHER): Payer: Medicare Other | Admitting: Family Medicine

## 2010-09-10 DIAGNOSIS — F329 Major depressive disorder, single episode, unspecified: Secondary | ICD-10-CM

## 2010-09-18 NOTE — Assessment & Plan Note (Signed)
Summary: 2 MONTH FOLLOWUP///[facility]   Vital Signs:  Patient profile:   77 year old male Weight:      209.2 pounds Pulse rate:   72 / minute Pulse rhythm:   regular BP sitting:   122 / 66  (left arm) Cuff size:   large  Vitals Entered By: Almeta Monas CMA Duncan Dull) (September 10, 2010 10:28 AM) CC: 2 mo med f/u   History of Present Illness: Pt here for f/u zoloft.  Pt thinks he is doing better but really doesn't know if he notices a big difference.  Pt is still playing tennis but wishes he could more around the house.  He remains active with exercise bike, tennis and walking.      Current Medications (verified): 1)  Lipitor 10 Mg  Tabs (Atorvastatin Calcium) .Marland Kitchen.. 1 By Mouth Pm 2)  Hydrochlorothiazide 25 Mg  Tabs (Hydrochlorothiazide) .Marland Kitchen.. 1 Daily 3)  Adult Aspirin Ec Low Strength 81 Mg  Tbec (Aspirin) 4)  Zoloft 50 Mg Tabs (Sertraline Hcl) .Marland Kitchen.. 1 By Mouth Once Daily  Allergies (verified): No Known Drug Allergies  Past History:  Past medical, surgical, family and social histories (including risk factors) reviewed for relevance to current acute and chronic problems.  Past Medical History: Reviewed history from 05/23/2008 and no changes required. Hyperlipidemia Hypertension Current Problems:  HEMORRHOIDS, EXTERNAL (ICD-455.3) HYPERTENSION (ICD-401.9) HYPERLIPIDEMIA (ICD-272.4)  Past Surgical History: Reviewed history from 10/17/2008 and no changes required. Prostatectomy (3/08) Cataract extraction (07/18/2008)--- Left  Family History: Reviewed history and no changes required.  Social History: Reviewed history from 12/26/2009 and no changes required.  Widow/Widower  Review of Systems      See HPI  Physical Exam  General:  Well-developed,well-nourished,in no acute distress; alert,appropriate and cooperative throughout examination Psych:  Oriented X3, normally interactive, and good eye contact.     Impression & Recommendations:  Problem # 1:  DEPRESSIVE DISORDER  (ICD-311) pt was not taking med everyday----instructed pt to take med every day His updated medication list for this problem includes:    Zoloft 50 Mg Tabs (Sertraline hcl) .Marland Kitchen... 1 by mouth once daily  Complete Medication List: 1)  Lipitor 10 Mg Tabs (Atorvastatin calcium) .Marland Kitchen.. 1 by mouth pm 2)  Hydrochlorothiazide 25 Mg Tabs (Hydrochlorothiazide) .Marland Kitchen.. 1 daily 3)  Adult Aspirin Ec Low Strength 81 Mg Tbec (Aspirin) 4)  Zoloft 50 Mg Tabs (Sertraline hcl) .Marland Kitchen.. 1 by mouth once daily 5)  Finasteride 5 Mg Tabs (Finasteride) .Marland Kitchen.. 1 by mouth 4x a week  Patient Instructions: 1)  Please schedule a follow-up appointment in 1 month.    2)  Take zoloft every day.    Orders Added: 1)  Est. Patient Level III [08657]

## 2010-09-18 NOTE — Progress Notes (Signed)
Summary: refill  Phone Note Refill Request Message from:  Patient  Refills Requested: Medication #1:  ZOLOFT 50 MG TABS 1 by mouth once daily [facility]  Initial call taken by: Jeremy Johann CMA,  September 10, 2010 11:20 AM    Prescriptions: ZOLOFT 50 MG TABS (SERTRALINE HCL) 1 by mouth once daily  #90 x 0   Entered by:   Jeremy Johann CMA   Authorized by:   Loreen Freud DO   Signed by:   Jeremy Johann CMA on 09/10/2010   Method used:   Faxed to ...       [facility]* (retail)             ,          Ph: [phone]       Fax: [phone]   RxID:   [id number]

## 2010-09-19 ENCOUNTER — Encounter: Payer: Self-pay | Admitting: Family Medicine

## 2010-10-11 ENCOUNTER — Ambulatory Visit (INDEPENDENT_AMBULATORY_CARE_PROVIDER_SITE_OTHER): Payer: Medicare Other | Admitting: Family Medicine

## 2010-10-11 ENCOUNTER — Encounter: Payer: Self-pay | Admitting: Family Medicine

## 2010-10-11 VITALS — BP 122/74 | HR 60 | Temp 99.3°F | Wt 206.2 lb

## 2010-10-11 DIAGNOSIS — F329 Major depressive disorder, single episode, unspecified: Secondary | ICD-10-CM

## 2010-10-11 DIAGNOSIS — J069 Acute upper respiratory infection, unspecified: Secondary | ICD-10-CM

## 2010-10-11 MED ORDER — SERTRALINE HCL 50 MG PO TABS: 100.0000 mg | ORAL_TABLET | Freq: Every day | ORAL | Status: DC

## 2010-10-11 NOTE — Assessment & Plan Note (Signed)
Increase zoloft to 100 List of psych given to son

## 2010-10-11 NOTE — Assessment & Plan Note (Signed)
Resolved If reoccurs he can use saline spray and otc antihistamine---no decongestants

## 2010-10-11 NOTE — Progress Notes (Signed)
  Subjective:    Patient ID: Louis Patel, male    DOB: 1926-02-14, 77 y.o.   MRN: [id number]  HPI Pt here with son f/u depression.  His son does not think the medication is doing anything.  His memory seems to be going.  He remembers details about his finances and has good .  Pt has trouble focusing and doesn't talk as much as he used to in a group.  Pt son feels he is still depressed.     Pt also c/o cough and congestion. Better now -- congested last week.  Nothing is really bothering him now.    Review of Systems  Constitutional: Positive for activity change. Negative for fever, chills, diaphoresis, appetite change, fatigue and unexpected weight change.  HENT: Negative for congestion, rhinorrhea, sneezing and postnasal drip.   Respiratory: Positive for cough (cough now resolved).   Psychiatric/Behavioral: Positive for dysphoric mood and decreased concentration. Negative for hallucinations, behavioral problems and agitation.       + memory problems  All other systems reviewed and are negative.       Objective:   Physical Exam  Constitutional: He appears well-developed and well-nourished.  Musculoskeletal: Normal range of motion. He exhibits no edema and no tenderness.  Psychiatric:       Flat affect although improved from last visit Not suicidal    MMSE  30/30        Assessment & Plan:

## 2010-10-15 ENCOUNTER — Other Ambulatory Visit: Payer: Self-pay | Admitting: *Deleted

## 2010-10-15 MED ORDER — ATORVASTATIN CALCIUM 10 MG PO TABS: 10.0000 mg | ORAL_TABLET | Freq: Every day | ORAL | Status: DC

## 2010-11-12 ENCOUNTER — Encounter: Payer: Self-pay | Admitting: Family Medicine

## 2010-11-12 ENCOUNTER — Ambulatory Visit (INDEPENDENT_AMBULATORY_CARE_PROVIDER_SITE_OTHER): Payer: Medicare Other | Admitting: Family Medicine

## 2010-11-12 VITALS — BP 108/70 | HR 60 | Wt 203.8 lb

## 2010-11-12 DIAGNOSIS — F329 Major depressive disorder, single episode, unspecified: Secondary | ICD-10-CM

## 2010-11-12 MED ORDER — SERTRALINE HCL 100 MG PO TABS: 100.0000 mg | ORAL_TABLET | Freq: Every day | ORAL | Status: DC

## 2010-11-12 NOTE — Assessment & Plan Note (Signed)
Take zoloft 100mg  . This was d/w pt F/u 2 months or sooner prn

## 2010-11-12 NOTE — Progress Notes (Signed)
  Subjective:    Patient ID: Louis Patel, male    DOB: 1926/06/28, 77 y.o.   MRN: [id number]  HPI Pt here f/u depression.  His daughter [name] is here.  Pt has not been taking zoloft 100mg --- he has been taking 50 mg .  Still no change as discussed last visit.   Review of Systems As above    Objective:   Physical Exam  Constitutional: He is oriented to person, place, and time. He appears well-developed and well-nourished.  Neurological: He is alert and oriented to person, place, and time.  Psychiatric: His speech is normal and behavior is normal. Judgment and thought content normal. His mood appears not anxious. His affect is blunt. His affect is not angry, not labile and not inappropriate. Cognition and memory are impaired. He exhibits a depressed mood.          Assessment & Plan:

## 2010-11-14 ENCOUNTER — Other Ambulatory Visit: Payer: Self-pay | Admitting: *Deleted

## 2010-11-15 ENCOUNTER — Other Ambulatory Visit (INDEPENDENT_AMBULATORY_CARE_PROVIDER_SITE_OTHER): Payer: Medicare Other

## 2010-11-15 DIAGNOSIS — E785 Hyperlipidemia, unspecified: Secondary | ICD-10-CM

## 2010-11-15 DIAGNOSIS — I10 Essential (primary) hypertension: Secondary | ICD-10-CM

## 2010-11-15 LAB — BASIC METABOLIC PANEL
CO2: 32 mEq/L (ref 19–32)
Calcium: 9 mg/dL (ref 8.4–10.5)
Creatinine, Ser: 1.1 mg/dL (ref 0.4–1.5)
Glucose, Bld: 98 mg/dL (ref 70–99)
Potassium: 3.7 mEq/L (ref 3.5–5.1)
Sodium: 139 mEq/L (ref 135–145)

## 2010-11-15 LAB — LIPID PANEL
HDL: 53 mg/dL (ref >39.00)
Triglycerides: 53 mg/dL (ref 0.0–149.0)

## 2010-11-15 LAB — HEPATIC FUNCTION PANEL
AST: 21 U/L (ref 0–37)
Albumin: 3.8 g/dL (ref 3.5–5.2)
Bilirubin, Direct: 0.2 mg/dL (ref 0.0–0.3)
Total Bilirubin: 0.8 mg/dL (ref 0.3–1.2)
Total Protein: 6.8 g/dL (ref 6.0–8.3)

## 2010-11-15 NOTE — Progress Notes (Signed)
Addended by: Floydene Flock on: 11/15/2010 08:44 AM   Modules accepted: Orders

## 2010-11-23 NOTE — Op Note (Signed)
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  Progressive obstructive symptoms  despite medical therapy.  The patient eventually developed urinary  retention with a postvoid residual of about 1000 mL.  I had a lengthy  discussion with the patient and family members; outlining what, I  thought, were the risks, the benefits, and the alternatives associated  with surgery.  PSA pattern over the last several years 5.0, 3.7, 3.4,  4.3, 4.1, 5.9, 2.6, and 4.4.  Rectal exam shows a nontender non-nodular  prostate.   MEDICATIONS INCLUDE:  Aspirin, hydrochlorothiazide, Lipitor, Cardura and  Avodart.  Aspirin has been discontinued about 7 or 10 days ago.   POSTOP DIAGNOSIS:  As above.   PROCEDURE:  1. Cystoscopy.  2. Transurethral resection of the prostate.  3. Transurethral resection VaporTrode.   ANESTHESIA:  General.   DRAINS:  1. A 24-French Foley.  2. A 10-French Bono SP tube.   SPECIMENS:  TUR chips.   PREOP HEMOGLOBIN:  15.2   DESCRIPTION OF PROCEDURE:  The patient was prepped and draped in the  dorsolithotomy position after institution of adequate level of general  anesthesia.  A well lubricated 21-French panendoscope was gently  inserted at the urethral meatus, normal urethra and sphincter, prostatic  urethra was elongated with coapting lateral lobes, and hyperemic mucosa.  Bladder was densely trabeculated, but showed a large capacity.  Careful  inspection with the 12-and-70-degree lenses was carried out; and showed  no other obvious intravesical pathology.  With  patient in steep  Trendelenburg position, bladder was filled to capacity of about 1100 mL.   Bono SP tube was then passed about a fingerbreadth above the symphysis  pubis, seen to enter the bladder directly and then was sewn into  position with a 4-0 nylon.  Once Louis Patel was in good position.  Cystoscope  was removed.  Resectoscope sheath was inserted.  Resection was initiated  with a single furrow at the 6 o'clock position to allow free efflux of  chips.  Resection was somewhat difficult mainly due to the hyperemic  mucosa.  Resection was begun, again, at the 2 o'clock position on the  right lobe and carried down to the 3 o'clock position; begun, again, at  the 10 o'clock position on the left lobe, and carried down 6 o'clock  position.  A small amount of tissue was resected anteriorly and no  capsular perforations were noted.   Resection time, mainly due to the size of the gland, was about an hour  and 10 minutes.  Once all resectable tissue had been removed, care was  taken to irrigate all chips free from the bladder.  VaporTrode element  was inserted to  allow adequate hemostasis within the prostatic urethra;  and a shelf of tissue at the bladder neck was incised at the 5 and 8  o'clock position using the [employer].  Once all resectable tissue  had been removed; bladder was filled to capacity; resectoscope sheath  was withdrawn.  Louis Patel was inserted over a stylet with  immediate return of several hundred milliliters of clear irrigant.  Continuous bladder irrigation was set up in through the [place] tube and  out through the [place]; and the patient was returned to recovery.  As  he awakened from anesthesia.  The patient had brief episode of  hypotension and tachycardia.  For that reason, we will check an EKG,  H&H, and electrolytes in the recovery room.           ______________________________  Boston Service, M.D.     RH/MEDQ  D:  09/09/2006  T:  09/09/2006   Job:  [id number]   cc:   Leanne Chang, M.D.  Fax: [phone]

## 2010-11-23 NOTE — Op Note (Signed)
   NAME:  Louis Patel, WALEN NO.:  [id number]   MEDICAL RECORD NO.:  [id number]                   PATIENT TYPE:  AMB   LOCATION:  ENDO                                 FACILITY:  [facility]   PHYSICIAN:  John C. Madilyn Fireman, M.D.                 DATE OF BIRTH:  March 26, 1926   DATE OF PROCEDURE:  03/01/2003  DATE OF DISCHARGE:                                 OPERATIVE REPORT   PROCEDURE:  Colonoscopy.   INDICATION FOR PROCEDURE:  History of adenomatous colon polyps on  colonoscopy three years ago.   DESCRIPTION OF PROCEDURE:  The patient was placed in the left lateral  decubitus position and placed on the pulse monitor with continuous low-flow  oxygen delivered by nasal cannula.  He was sedated with 50 mcg IV fentanyl  and 4 mg IV Versed.  The Olympus video colonoscope was inserted into the  rectum and advanced to the cecum, confirmed by transillumination at  McBurney's point and visualization of the ileocecal valve and appendiceal  orifice.  The prep was excellent.  The cecum, ascending, and transverse  colon all appeared normal with no masses, polyps, diverticula, or other  mucosal abnormalities.  Within the descending and sigmoid colon, there were  seen several scattered diverticula and no other abnormalities.  The rectum  appeared normal, and retroflexed view of the anus revealed no obvious  internal hemorrhoids.  The colonoscope was then withdrawn, and the patient  returned to the recovery room in stable condition.  He tolerated the  procedure well, and there were no immediate complications.   IMPRESSION:  1. Diverticulosis.  2. Otherwise, normal study.   PLAN:  Repeat colonoscopy in five years.                                               John C. Madilyn Fireman, M.D.    JCH/MEDQ  D:  03/01/2003  T:  03/01/2003  Job:  [id number]   cc:   Thelma Barge P. Modesto Charon, M.D.  [address]  [place]  [place] [address]  Fax: [phone]

## 2010-11-23 NOTE — Procedures (Signed)
[facility]  Patient:    Louis Patel                         MRN: [id number] Proc. Date: 02/04/00 Adm. Date:  [id number] Attending:  Louie Bun CC:         Lilyan Punt Sydnee Levans, M.D.                           Procedure Report  PROCEDURE PERFORMED:  Colonoscopy.  ENDOSCOPIST:  Everardo All. Madilyn Fireman, M.D.  INDICATIONS FOR PROCEDURE:  Family history of colon cancer in a first degree relative in a 76 year old patient with no prior colon screening.  DESCRIPTION OF PROCEDURE:  The patient was placed in the left lateral decubitus position and placed on the pulse monitor and continuous low flow oxygen delivered by nasal cannula.  He was sedated with 40 mg IV Demerol and 4 mg IV Versed.  The Olympus video colonoscope was inserted into the rectum and advanced to the cecum, confirmed by transillumination of McBurneys point and visualization of the ileocecal valve and the appendiceal orifice.  The prep was good. The cecum appeared normal.  In the ascending colon was a 1 cm sessile polyp removed by snare.  A similar appearing polyp was seen in the transverse colon and also removed by snare.  Otherwise, no lesions were seen in the ascending or transverse colon.  There were scattered diverticula seen within the descending and sigmoid colon.  No other abnormalities noted.  The rectum appeared normal and retroflex view of the anus revealed no obvious internal hemorrhoids.  The colonoscopic was then withdrawn and the patient returned to the recovery room in stable condition.  The patient tolerated the procedure well and there were no immediate complications.  IMPRESSION: 1. Ascending and transverse colon polyps. 2. Diverticulosis.  PLAN:  Await histology for determination of interval for next colonoscopy. DD:  02/04/00 TD:  02/05/00 Job: [id number]

## 2010-11-23 NOTE — Discharge Summary (Signed)
NAME:  RUE, TINNEL NO.:  [id number]   MEDICAL RECORD NO.:  [id number]          PATIENT TYPE:  INP   LOCATION:  1401                         [facility]AC[facility]   PHYSICIAN:  Boston Service, M.D.DATE OF BIRTH:  05-27-1926   DATE OF ADMISSION:  09/09/2006  DATE OF DISCHARGE:  09/12/2006                               DISCHARGE SUMMARY   History of the present illness, medications, allergies, past medical  history, social history, physical exam and review of systems all  outlined in the admitting material.   HOSPITAL COURSE:  The patient was admitted September 09, 2006, for TURP.  Preoperative PSA 4.5.  postvoid residual 1200 mL.  The patient underwent  TUR September 09, 2006, without difficulty.  Procedure had started late in  the day at about 1:30 p.m.  There was some question about the patient  being dehydrated as well as hypotensive in the recovery room.  Gratefully appreciate pulmonary consult from Marcelyn Bruins, MD.  Those  findings were reviewed.  Given the patient's age he was monitored  overnight as fluid replacement was given.  Postoperative day #1  hemoglobin 10.2.  Cardiac enzymes, chest x-ray and hemoglobin all felt  to be within an acceptable range.  The patient was transferred to the  floor.  Due to his elevated postvoid residual a Bonanno tube had been  placed at the time of surgery.  Continuous bladder irrigation was  discontinued on postoperative day #2.  Voiding trial on postoperative  day #3, September 12, 2006, was unsuccessful.  Postvoid residuals ranged  between about 6 mL and 500 mL.  Plan was made to discharge the patient  home with indwelling Bonanno SP tube with plan for followup office visit  in about a week as we assess voided volumes and residuals.  Final  pathology report on tissue removed at the time of surgery returned BPH.  Discharge medications included Macrobid, Phenergan and Vicodin.   PLAN:  OV, PVR check in about a week.     ______________________________  Boston Service, M.D.     RH/MEDQ  D:  10/16/2006  T:  10/16/2006  Job:  [id number]   cc:   Leanne Chang, M.D.  Fax: [phone]

## 2010-12-13 ENCOUNTER — Other Ambulatory Visit: Payer: Self-pay | Admitting: *Deleted

## 2010-12-13 MED ORDER — SERTRALINE HCL 100 MG PO TABS: 100.0000 mg | ORAL_TABLET | Freq: Every day | ORAL | Status: DC

## 2010-12-27 ENCOUNTER — Other Ambulatory Visit: Payer: Self-pay | Admitting: *Deleted

## 2010-12-27 MED ORDER — SERTRALINE HCL 100 MG PO TABS: 100.0000 mg | ORAL_TABLET | Freq: Every day | ORAL | Status: DC

## 2010-12-27 NOTE — Telephone Encounter (Signed)
Pt aware Rx sent to pharmacy 

## 2010-12-31 ENCOUNTER — Other Ambulatory Visit: Payer: Self-pay | Admitting: *Deleted

## 2010-12-31 MED ORDER — SERTRALINE HCL 100 MG PO TABS: 100.0000 mg | ORAL_TABLET | Freq: Every day | ORAL | Status: DC

## 2011-01-02 ENCOUNTER — Other Ambulatory Visit: Payer: Self-pay | Admitting: Family Medicine

## 2011-01-21 ENCOUNTER — Ambulatory Visit (INDEPENDENT_AMBULATORY_CARE_PROVIDER_SITE_OTHER): Payer: Medicare Other | Admitting: Family Medicine

## 2011-01-21 ENCOUNTER — Encounter: Payer: Self-pay | Admitting: Family Medicine

## 2011-01-21 VITALS — BP 116/74 | HR 80 | Temp 98.7°F | Wt 203.6 lb

## 2011-01-21 DIAGNOSIS — F329 Major depressive disorder, single episode, unspecified: Secondary | ICD-10-CM

## 2011-01-21 DIAGNOSIS — R413 Other amnesia: Secondary | ICD-10-CM

## 2011-01-21 MED ORDER — DONEPEZIL HCL 5 MG PO TABS: 5.0000 mg | ORAL_TABLET | Freq: Every day | ORAL | Status: DC

## 2011-01-22 ENCOUNTER — Other Ambulatory Visit: Payer: Self-pay | Admitting: *Deleted

## 2011-01-22 DIAGNOSIS — R413 Other amnesia: Secondary | ICD-10-CM | POA: Insufficient documentation

## 2011-01-22 MED ORDER — SERTRALINE HCL 100 MG PO TABS: 100.0000 mg | ORAL_TABLET | Freq: Every day | ORAL | Status: DC

## 2011-01-22 NOTE — Assessment & Plan Note (Signed)
con't zoloft for now

## 2011-01-22 NOTE — Assessment & Plan Note (Signed)
Start aricept 5 mg Refer to neuro

## 2011-01-22 NOTE — Progress Notes (Signed)
  Subjective:    Patient ID: Louis Patel, male    DOB: [birth year], 77 y.o.   MRN: [id number]  HPI  Pt here with daughter to f/u depression and memory loss.  Daughter states she doesn't think the zoloft is helping at all. No other complaints.  Review of Systems as above   Objective:   Physical Exam  Constitutional: Louis Patel is oriented to person, place, and time. Louis Patel appears well-developed and well-nourished.  Neurological: Louis Patel is alert and oriented to person, place, and time.  Psychiatric: Louis Patel has a normal mood and affect. His speech is normal and behavior is normal. Thought content normal. Cognition and memory are impaired.       MMSE 28/30 Animal test---15 60sec          Assessment & Plan:

## 2011-01-24 ENCOUNTER — Telehealth: Payer: Self-pay | Admitting: *Deleted

## 2011-01-24 MED ORDER — SERTRALINE HCL 100 MG PO TABS: 100.0000 mg | ORAL_TABLET | Freq: Every day | ORAL | Status: DC

## 2011-01-24 NOTE — Telephone Encounter (Signed)
Pt daughter left VM that Pt has Rx with mail order but it will not arrive until 7-10 days. Pt will be out of med and needs Rx sent to local pharmacy

## 2011-01-24 NOTE — Telephone Encounter (Signed)
Pt daughter aware Rx faxed to local pharmacy.

## 2011-02-13 ENCOUNTER — Other Ambulatory Visit: Payer: Self-pay | Admitting: Family Medicine

## 2011-02-13 MED ORDER — ATORVASTATIN CALCIUM 10 MG PO TABS: 10.0000 mg | ORAL_TABLET | ORAL | Status: DC

## 2011-02-13 NOTE — Telephone Encounter (Signed)
Pt aware to continue med and recheck labs 6 month from last labs. Pt ok info and Rx sent in to pharmacy.

## 2011-02-27 ENCOUNTER — Other Ambulatory Visit: Payer: Self-pay

## 2011-02-27 ENCOUNTER — Other Ambulatory Visit: Payer: Self-pay | Admitting: Neurology

## 2011-02-27 DIAGNOSIS — R413 Other amnesia: Secondary | ICD-10-CM

## 2011-02-27 DIAGNOSIS — I10 Essential (primary) hypertension: Secondary | ICD-10-CM

## 2011-02-27 DIAGNOSIS — E782 Mixed hyperlipidemia: Secondary | ICD-10-CM

## 2011-02-27 MED ORDER — DONEPEZIL HCL 5 MG PO TABS: 5.0000 mg | ORAL_TABLET | Freq: Every day | ORAL | Status: DC

## 2011-03-07 ENCOUNTER — Ambulatory Visit
Admission: RE | Admit: 2011-03-07 | Discharge: 2011-03-07 | Disposition: A | Discharge status: Home / Self Care | Payer: Medicare Other | Source: Ambulatory Visit | Attending: Neurology | Admitting: Neurology

## 2011-03-07 DIAGNOSIS — R413 Other amnesia: Secondary | ICD-10-CM

## 2011-03-07 DIAGNOSIS — I10 Essential (primary) hypertension: Secondary | ICD-10-CM

## 2011-03-07 DIAGNOSIS — E782 Mixed hyperlipidemia: Secondary | ICD-10-CM

## 2011-03-14 ENCOUNTER — Encounter: Payer: Self-pay | Admitting: Family Medicine

## 2011-03-14 ENCOUNTER — Ambulatory Visit (INDEPENDENT_AMBULATORY_CARE_PROVIDER_SITE_OTHER): Payer: Medicare Other | Admitting: Family Medicine

## 2011-03-14 VITALS — BP 116/64 | HR 59 | Temp 98.8°F | Wt 208.2 lb

## 2011-03-14 DIAGNOSIS — T148XXA Other injury of unspecified body region, initial encounter: Secondary | ICD-10-CM

## 2011-03-14 DIAGNOSIS — L0291 Cutaneous abscess, unspecified: Secondary | ICD-10-CM

## 2011-03-14 DIAGNOSIS — L0390 Cellulitis, unspecified: Secondary | ICD-10-CM

## 2011-03-14 DIAGNOSIS — Z23 Encounter for immunization: Secondary | ICD-10-CM

## 2011-03-14 MED ORDER — CEPHALEXIN 500 MG PO CAPS: ORAL_CAPSULE | ORAL | Status: DC

## 2011-03-14 NOTE — Patient Instructions (Signed)
Wound Infection A wound infection has happened because germs (bacteria) have started growing in the wound. All wounds have some bacteria in them. An infection can occur when the skin has been damaged either by accident or surgically. A surgical example of this would be when your caregiver cuts your skin to remove a mole. If stitches need to be removed, and a wound infection develops, the stitches may have to be removed early. This means that the entire wound may break apart. This may look terrible to you but it is not dangerous. It will heal from the inside to the outside. Wound infection is more common and can be more complicated for diabetics and for those patients with weakened immune systems (such as for those receiving treatment for cancer). HOME CARE INSTRUCTIONS  Rinse your wound with warm, soapy water four times per day, or as directed by your caregiver.   When through washing, gently pat the wound dry with a soft fabric. As the wound is healing, millions of tiny new blood vessels are forming. Do not rub the wound. This may cause bleeding. Apply dressing to wound as directed.   Only take over-the-counter or prescription medicines for pain, discomfort, or fever as directed by your caregiver.   Do not soak wound, as in bathing or swimming, until it is healed or as directed by your caregiver. Avoid work out exercises that make you sweat heavily.   During healing the wound may itch. You may use over the counter antihistamines in a dosage as recommended on package. If this medication makes you sleepy then take it as a dose at bedtime.   If your caregiver has prescribed a medication that kills germs (antibiotics), take your medication as directed. Take it for the entire length of time it was prescribed, even if the wound appears to be doing well.  SEEK IMMEDIATE MEDICAL CARE IF:  There is increased swelling or increased pain near the wound.   There is increasing redness (inflammation) around  the wound.   There is an increase in the amount of pus coming from the wound.   More of the wound breaks open if only a small number of the stitches were removed.   You or your child has an oral temperature above 102 F (38.9 C), not controlled by medicine.   Your baby is older than 3 months with a rectal temperature of 102 F (38.9 C) or higher.   Your baby is 42 months old or younger with a rectal temperature of 100.4 F (38 C) or higher.  Return to your caregiver's office in 4-5 days or as directed to have your wound rechecked. Document Released: 03/23/2003 Document Re-Released: 04/21/2009 [facility] Patient Information 2011 [place], [place].

## 2011-03-14 NOTE — Progress Notes (Signed)
  Subjective:    Patient ID: Louis Patel, male    DOB: 03-12-1926, 77 y.o.   MRN: [id number]  HPI Pt here c/o wound R low leg x 3 weeks and it is not healing.  Pt scratched his leg on a chair at his son's house and skin came off.  He has been putting neosporin on it.  Review of Systems As directed    Objective:   Physical Exam  Constitutional: He appears well-developed and well-nourished.  Neurological: He is alert.  Skin:       RLow leg---+ area errythema where skin was pulled off                        Draining clear fluid                           Warm to touch  Psychiatric: He has a normal mood and affect.          Assessment & Plan:  Wound infection---- keflex for 10 days                                  rto if it is not healing in 4-5 days or sooner if it gets worse

## 2011-03-21 ENCOUNTER — Ambulatory Visit (INDEPENDENT_AMBULATORY_CARE_PROVIDER_SITE_OTHER): Payer: Medicare Other | Admitting: Family Medicine

## 2011-03-21 ENCOUNTER — Encounter: Payer: Self-pay | Admitting: Family Medicine

## 2011-03-21 VITALS — BP 110/64 | HR 65 | Temp 98.3°F | Wt 208.0 lb

## 2011-03-21 DIAGNOSIS — L0390 Cellulitis, unspecified: Secondary | ICD-10-CM

## 2011-03-21 NOTE — Patient Instructions (Signed)
Wound looks much better. Finish the antibiotic.  Call us if you feel you may need a few more days.

## 2011-03-21 NOTE — Progress Notes (Signed)
  Subjective:    Patient ID: Louis Patel, male   DOB: 08/24/1925, 77 y.o.   MRN: [id number]  HPI  Pt here for wound check.  No complaints  Review of Systems    as above Objective:   Physical Exam  Constitutional: He appears well-developed and well-nourished.  Neurological: He is alert.  Skin: Skin is warm and dry.       Wound healing well -- low leg           Assessment & Plan:  Wound R low leg--healing well       Finish abx

## 2011-03-22 ENCOUNTER — Encounter: Payer: Self-pay | Admitting: Family Medicine

## 2011-04-25 ENCOUNTER — Ambulatory Visit (INDEPENDENT_AMBULATORY_CARE_PROVIDER_SITE_OTHER): Payer: Medicare Other

## 2011-04-25 DIAGNOSIS — Z23 Encounter for immunization: Secondary | ICD-10-CM

## 2011-05-13 ENCOUNTER — Telehealth: Payer: Self-pay | Admitting: Family Medicine

## 2011-05-13 MED ORDER — SERTRALINE HCL 25 MG PO TABS: 25.0000 mg | ORAL_TABLET | Freq: Every day | ORAL | Status: DC

## 2011-05-13 NOTE — Telephone Encounter (Signed)
Pt last seen on 01-21-11 per notes Pt continue med. Please advise when Pt is to f/u or if med can be D/C

## 2011-05-13 NOTE — Telephone Encounter (Signed)
If he has not been taking it he can stop

## 2011-05-13 NOTE — Telephone Encounter (Deleted)
Discuss with patient will D/C med and call with any further concerns.

## 2011-05-13 NOTE — Telephone Encounter (Signed)
Per Dr Laury Axon Pt to decrease to 1/2 tab x then call in Rx for Zoloft 25 mg. Discuss with patient Rx sent to pharmacy.

## 2011-07-05 ENCOUNTER — Telehealth: Payer: Self-pay

## 2011-07-05 NOTE — Telephone Encounter (Addendum)
Spoke with Junious Dresser and she stated that she wanted to know if the patient should be taking Zoloft. I advised of the notes on 05/13/11.    Per Dr Laury Axon Pt to decrease to 1/2 tab x then call in Rx for Zoloft 25 mg.    She voiced understanding    KP

## 2011-07-15 ENCOUNTER — Encounter: Payer: Self-pay | Admitting: Family Medicine

## 2011-07-15 ENCOUNTER — Ambulatory Visit (INDEPENDENT_AMBULATORY_CARE_PROVIDER_SITE_OTHER): Payer: Medicare Other | Admitting: Family Medicine

## 2011-07-15 VITALS — BP 114/76 | HR 67 | Temp 97.7°F | Wt 217.0 lb

## 2011-07-15 DIAGNOSIS — L259 Unspecified contact dermatitis, unspecified cause: Secondary | ICD-10-CM

## 2011-07-15 DIAGNOSIS — L309 Dermatitis, unspecified: Secondary | ICD-10-CM

## 2011-07-15 DIAGNOSIS — E785 Hyperlipidemia, unspecified: Secondary | ICD-10-CM

## 2011-07-15 DIAGNOSIS — E039 Hypothyroidism, unspecified: Secondary | ICD-10-CM

## 2011-07-15 NOTE — Progress Notes (Signed)
  Subjective:    Patient ID: Louis Patel, male    DOB: [birth year], 78 y.o.   MRN: [id number]  HPI Pt is here with his son to f/u from neuro.  His TSH was elevated---see ov from neuro.   Pt has stoppped zoloft.  According to his son he is also not taking his Lipitor.   He has only been taking it everyother day but recently stopped all together---no reason.   Pt also c/o rash on both legs.  It has been there for years but recently turned red.  Pt states it is not itchy or painful.     Review of Systems As above    Objective:   Physical Exam  Constitutional: He is oriented to person, place, and time. He appears well-developed and well-nourished.  Neck: Normal range of motion. Neck supple. No thyromegaly present.  Cardiovascular: Normal rate, regular rhythm and normal heart sounds.   No murmur heard. Pulmonary/Chest: Effort normal and breath sounds normal.  Abdominal: Soft. Bowel sounds are normal. He exhibits no distension. There is no tenderness. There is no rebound and no guarding.  Musculoskeletal: Normal range of motion. He exhibits no edema and no tenderness.  Lymphadenopathy:    He has no cervical adenopathy.  Neurological: He is alert and oriented to person, place, and time.  Skin:       Dry scaly skin with errythema + white patches  Psychiatric: He has a normal mood and affect. His behavior is normal.          Assessment & Plan:  ? Hypothyroidism---- recheck labs ? Psoriasis vs eczema---- eucerin cream with hydrocortisone 50/50 for 2 weeks only--- to derm prn Hyperlipidemia----restart lipitor qod --- son will put meds in daily pill box

## 2011-07-15 NOTE — Patient Instructions (Signed)

## 2011-07-16 ENCOUNTER — Telehealth: Payer: Self-pay

## 2011-07-16 LAB — T3, FREE: T3, Free: 2.5 pg/mL (ref 2.3–4.2)

## 2011-07-16 LAB — LDL CHOLESTEROL, DIRECT: Direct LDL: 147.9 mg/dL

## 2011-07-16 LAB — LIPID PANEL
Cholesterol: 219 mg/dL — ABNORMAL HIGH (ref 0–200)
Total CHOL/HDL Ratio: 4
Triglycerides: 134 mg/dL (ref 0.0–149.0)

## 2011-07-16 LAB — HEPATIC FUNCTION PANEL
ALT: 24 U/L (ref 0–53)
AST: 25 U/L (ref 0–37)
Albumin: 3.9 g/dL (ref 3.5–5.2)

## 2011-07-16 LAB — TSH: TSH: 5.96 u[IU]/mL — ABNORMAL HIGH (ref 0.35–5.50)

## 2011-07-16 NOTE — Telephone Encounter (Signed)
Roseanne called and stated that she spoke with her dad and he was confused about his apt yesterday. I explained per the office notes with Dr.Lowne and she voiced understanding. Labs are pending and patient will need to use Eucerin daily with Hydrocortisone 50/50 and if not relief from rash in 2 week we will ref to Derm.    KP

## 2011-07-17 ENCOUNTER — Telehealth: Payer: Self-pay | Admitting: Family Medicine

## 2011-07-17 MED ORDER — HYDROCHLOROTHIAZIDE 25 MG PO TABS: 25.0000 mg | ORAL_TABLET | Freq: Every day | ORAL | Status: DC

## 2011-07-17 NOTE — Telephone Encounter (Signed)
Pt requesting hydrochlorothiazide 25mg  refill

## 2011-07-18 MED ORDER — LEVOTHYROXINE SODIUM 50 MCG PO TABS: 50.0000 ug | ORAL_TABLET | Freq: Every day | ORAL | Status: DC

## 2011-08-05 ENCOUNTER — Encounter: Payer: Self-pay | Admitting: Family Medicine

## 2011-08-05 ENCOUNTER — Ambulatory Visit (INDEPENDENT_AMBULATORY_CARE_PROVIDER_SITE_OTHER): Payer: Medicare Other | Admitting: Family Medicine

## 2011-08-05 VITALS — BP 120/72 | HR 71 | Temp 98.0°F | Wt 215.8 lb

## 2011-08-05 DIAGNOSIS — L0390 Cellulitis, unspecified: Secondary | ICD-10-CM

## 2011-08-05 DIAGNOSIS — L309 Dermatitis, unspecified: Secondary | ICD-10-CM

## 2011-08-05 DIAGNOSIS — L259 Unspecified contact dermatitis, unspecified cause: Secondary | ICD-10-CM

## 2011-08-05 MED ORDER — AMOXICILLIN-POT CLAVULANATE 875-125 MG PO TABS: 1.0000 | ORAL_TABLET | Freq: Two times a day (BID) | ORAL | Status: DC

## 2011-08-05 NOTE — Progress Notes (Signed)
  Subjective:    Patient ID: Louis Patel, male    DOB: January 21, 1926, 78 y.o.   MRN: [id number]  HPI Pt here c/o rash on both legs worse.  Cream did not help.  See last visit.  + itchy.   Review of Systems as above   Objective:   Physical Exam  Constitutional: He appears well-developed and well-nourished.  Skin:             Assessment & Plan:  cellultis with eczema vs impetigo----  augmentin for 10 days and refer to derm

## 2011-08-05 NOTE — Patient Instructions (Signed)

## 2011-09-09 ENCOUNTER — Telehealth: Payer: Self-pay

## 2011-09-09 NOTE — Telephone Encounter (Signed)
Patient called to clarify what medications he is supposed to be taking.  We went through them one by one and he wrote them down.

## 2011-09-24 ENCOUNTER — Telehealth: Payer: Self-pay | Admitting: *Deleted

## 2011-09-24 NOTE — Telephone Encounter (Signed)
Patient called back and stated he is on Levothyroxine & just picked it up from the Pharmacy. He says he is good & has no more questions at this time

## 2011-09-24 NOTE — Telephone Encounter (Signed)
Pt left VM that he would like a call back to clarify which med he is suppose to be taking. Left message to call office

## 2011-09-24 NOTE — Telephone Encounter (Signed)
noted 

## 2011-10-02 ENCOUNTER — Telehealth: Payer: Self-pay | Admitting: Family Medicine

## 2011-10-02 MED ORDER — HYDROCHLOROTHIAZIDE 25 MG PO TABS: 25.0000 mg | ORAL_TABLET | Freq: Every day | ORAL | Status: DC

## 2011-10-02 NOTE — Telephone Encounter (Signed)
Refill for [facility] fax [phone]  Hydrochlorazathide Tab  25MG  Take 1 PO QD Qty 90 Last written 1.9.13

## 2011-10-02 NOTE — Telephone Encounter (Signed)
Faxed.   KP 

## 2011-10-14 ENCOUNTER — Other Ambulatory Visit (INDEPENDENT_AMBULATORY_CARE_PROVIDER_SITE_OTHER): Payer: Medicare Other

## 2011-10-14 DIAGNOSIS — E039 Hypothyroidism, unspecified: Secondary | ICD-10-CM

## 2011-10-14 DIAGNOSIS — E785 Hyperlipidemia, unspecified: Secondary | ICD-10-CM

## 2011-10-14 LAB — BASIC METABOLIC PANEL
CO2: 28 mEq/L (ref 19–32)
Chloride: 102 mEq/L (ref 96–112)
Creatinine, Ser: 1.1 mg/dL (ref 0.4–1.5)
Potassium: 3.7 mEq/L (ref 3.5–5.1)
Sodium: 140 mEq/L (ref 135–145)

## 2011-10-14 LAB — HEPATIC FUNCTION PANEL
Albumin: 4.1 g/dL (ref 3.5–5.2)
Alkaline Phosphatase: 60 U/L (ref 39–117)

## 2011-10-14 LAB — LIPID PANEL
LDL Cholesterol: 93 mg/dL (ref 0–99)
Total CHOL/HDL Ratio: 3
Triglycerides: 91 mg/dL (ref 0.0–149.0)

## 2011-11-08 ENCOUNTER — Other Ambulatory Visit: Payer: Self-pay | Admitting: Family Medicine

## 2011-11-08 MED ORDER — LEVOTHYROXINE SODIUM 50 MCG PO TABS: 50.0000 ug | ORAL_TABLET | Freq: Every day | ORAL | Status: DC

## 2011-11-08 NOTE — Telephone Encounter (Signed)
Refill levothyroxine Sodium Tab MYL Qty 30 Take one tablet by mouth one time daily Last filled 3.18.13 Last OV 1.28.13

## 2012-01-16 ENCOUNTER — Other Ambulatory Visit: Payer: Self-pay | Admitting: *Deleted

## 2012-01-16 DIAGNOSIS — R413 Other amnesia: Secondary | ICD-10-CM

## 2012-01-16 MED ORDER — DONEPEZIL HCL 5 MG PO TABS: 5.0000 mg | ORAL_TABLET | Freq: Every day | ORAL | Status: DC

## 2012-01-16 NOTE — Telephone Encounter (Signed)
Last OV 08-05-11, last filled 02-25-11 #90 1

## 2012-01-16 NOTE — Telephone Encounter (Signed)
Refill x1   2 refills 

## 2012-02-21 ENCOUNTER — Other Ambulatory Visit: Payer: Self-pay | Admitting: Family Medicine

## 2012-02-21 MED ORDER — ATORVASTATIN CALCIUM 10 MG PO TABS: 10.0000 mg | ORAL_TABLET | ORAL | Status: DC

## 2012-02-21 NOTE — Telephone Encounter (Signed)
ATORVASTATIN 10MG  TABLET QTY: 90 DAYS TAKE 1 PO DAILY AS DIRECTED

## 2012-02-26 MED ORDER — ATORVASTATIN CALCIUM 10 MG PO TABS: 10.0000 mg | ORAL_TABLET | ORAL | Status: DC

## 2012-02-26 NOTE — Telephone Encounter (Signed)
recev a 2nd request can you send to [facility] instead of [facility] Thanks

## 2012-02-26 NOTE — Addendum Note (Signed)
Addended by: Arnette Norris on: 02/26/2012 04:04 PM   Modules accepted: Orders

## 2012-04-21 ENCOUNTER — Other Ambulatory Visit: Payer: Medicare Other

## 2012-04-27 ENCOUNTER — Telehealth: Payer: Self-pay | Admitting: Family Medicine

## 2012-04-27 NOTE — Telephone Encounter (Signed)
Caller: Alphonsa/Patient; Patient Name: Louis Patel; PCP: Lelon Perla.; Best Callback Phone Number: [phone]; Reason for call: Other Onset- 04/25/2012 Afebrile. Pt c/o of constipation. He is having bowel movements  but only getting out small amount. He did have a bowel movement today. Emergent  s/s of Constipation protocol r/o. Home care advice given.

## 2012-04-27 NOTE — Telephone Encounter (Signed)
Discussed with patient and he said he was able to get colace and has been able to go to the bathroom    KP

## 2012-04-27 NOTE — Telephone Encounter (Signed)
miralax daily as needed

## 2012-04-30 ENCOUNTER — Other Ambulatory Visit (INDEPENDENT_AMBULATORY_CARE_PROVIDER_SITE_OTHER): Payer: Medicare Other

## 2012-04-30 ENCOUNTER — Ambulatory Visit (INDEPENDENT_AMBULATORY_CARE_PROVIDER_SITE_OTHER): Payer: Medicare Other

## 2012-04-30 DIAGNOSIS — Z23 Encounter for immunization: Secondary | ICD-10-CM

## 2012-04-30 DIAGNOSIS — E785 Hyperlipidemia, unspecified: Secondary | ICD-10-CM

## 2012-04-30 DIAGNOSIS — I10 Essential (primary) hypertension: Secondary | ICD-10-CM

## 2012-04-30 LAB — HEPATIC FUNCTION PANEL
ALT: 23 U/L (ref 0–53)
AST: 20 U/L (ref 0–37)
Albumin: 3.8 g/dL (ref 3.5–5.2)
Alkaline Phosphatase: 65 U/L (ref 39–117)
Bilirubin, Direct: 0.2 mg/dL (ref 0.0–0.3)
Total Protein: 7.6 g/dL (ref 6.0–8.3)

## 2012-04-30 LAB — BASIC METABOLIC PANEL
BUN: 22 mg/dL (ref 6–23)
Calcium: 9.2 mg/dL (ref 8.4–10.5)
Creatinine, Ser: 1.1 mg/dL (ref 0.4–1.5)

## 2012-04-30 LAB — LIPID PANEL
Cholesterol: 188 mg/dL (ref 0–200)
LDL Cholesterol: 120 mg/dL — ABNORMAL HIGH (ref 0–99)
Triglycerides: 71 mg/dL (ref 0.0–149.0)

## 2012-06-18 ENCOUNTER — Other Ambulatory Visit: Payer: Self-pay | Admitting: Family Medicine

## 2012-06-19 ENCOUNTER — Other Ambulatory Visit: Payer: Self-pay | Admitting: Family Medicine

## 2012-06-19 MED ORDER — HYDROCHLOROTHIAZIDE 25 MG PO TABS: 25.0000 mg | ORAL_TABLET | Freq: Every day | ORAL | Status: DC

## 2012-06-19 NOTE — Telephone Encounter (Signed)
refill  Hydrochlorot 25 MG Take 1 tablet (25 mg total) by mouth daily. #90--last wrt 3.27.13 #90 x 1-- last ov 1.28.13 acute

## 2012-08-24 ENCOUNTER — Encounter: Payer: Self-pay | Admitting: Family Medicine

## 2012-08-24 ENCOUNTER — Ambulatory Visit (INDEPENDENT_AMBULATORY_CARE_PROVIDER_SITE_OTHER): Payer: Medicare Other | Admitting: Family Medicine

## 2012-08-24 VITALS — BP 140/80 | HR 90 | Temp 98.1°F | Ht 64.5 in | Wt 211.2 lb

## 2012-08-24 DIAGNOSIS — Z0000 Encounter for general adult medical examination without abnormal findings: Secondary | ICD-10-CM

## 2012-08-24 DIAGNOSIS — E785 Hyperlipidemia, unspecified: Secondary | ICD-10-CM

## 2012-08-24 DIAGNOSIS — E039 Hypothyroidism, unspecified: Secondary | ICD-10-CM

## 2012-08-24 DIAGNOSIS — I10 Essential (primary) hypertension: Secondary | ICD-10-CM

## 2012-08-24 DIAGNOSIS — R972 Elevated prostate specific antigen [PSA]: Secondary | ICD-10-CM

## 2012-08-24 NOTE — Patient Instructions (Signed)
Preventive Care for Adults, Male A healthy lifestyle and preventive care can promote health and wellness. Preventive health guidelines for men include the following key practices:  A routine yearly physical is a good way to check with your caregiver about your health and preventative screening. It is a chance to share any concerns and updates on your health, and to receive a thorough exam.  Visit your dentist for a routine exam and preventative care every 6 months. Brush your teeth twice a day and floss once a day. Good oral hygiene prevents tooth decay and gum disease.  The frequency of eye exams is based on your age, health, family medical history, use of contact lenses, and other factors. Follow your caregiver's recommendations for frequency of eye exams.  Eat a healthy diet. Foods like vegetables, fruits, whole grains, low-fat dairy products, and lean protein foods contain the nutrients you need without too many calories. Decrease your intake of foods high in solid fats, added sugars, and salt. Eat the right amount of calories for you.Get information about a proper diet from your caregiver, if necessary.  Regular physical exercise is one of the most important things you can do for your health. Most adults should get at least 150 minutes of moderate-intensity exercise (any activity that increases your heart rate and causes you to sweat) each week. In addition, most adults need muscle-strengthening exercises on 2 or more days a week.  Maintain a healthy weight. The body mass index (BMI) is a screening tool to identify possible weight problems. It provides an estimate of body fat based on height and weight. Your caregiver can help determine your BMI, and can help you achieve or maintain a healthy weight.For adults 20 years and older:  A BMI below 18.5 is considered underweight.  A BMI of 18.5 to 24.9 is normal.  A BMI of 25 to 29.9 is considered overweight.  A BMI of 30 and above is  considered obese.  Maintain normal blood lipids and cholesterol levels by exercising and minimizing your intake of saturated fat. Eat a balanced diet with plenty of fruit and vegetables. Blood tests for lipids and cholesterol should begin at age 20 and be repeated every 5 years. If your lipid or cholesterol levels are high, you are over 50, or you are a high risk for heart disease, you may need your cholesterol levels checked more frequently.Ongoing high lipid and cholesterol levels should be treated with medicines if diet and exercise are not effective.  If you smoke, find out from your caregiver how to quit. If you do not use tobacco, do not start.  If you choose to drink alcohol, do not exceed 2 drinks per day. One drink is considered to be 12 ounces (355 mL) of beer, 5 ounces (148 mL) of wine, or 1.5 ounces (44 mL) of liquor.  Avoid use of street drugs. Do not share needles with anyone. Ask for help if you need support or instructions about stopping the use of drugs.  High blood pressure causes heart disease and increases the risk of stroke. Your blood pressure should be checked at least every 1 to 2 years. Ongoing high blood pressure should be treated with medicines, if weight loss and exercise are not effective.  If you are 45 to 77 years old, ask your caregiver if you should take aspirin to prevent heart disease.  Diabetes screening involves taking a blood sample to check your fasting blood sugar level. This should be done once every 3 years,   after age 45, if you are within normal weight and without risk factors for diabetes. Testing should be considered at a younger age or be carried out more frequently if you are overweight and have at least 1 risk factor for diabetes.  Colorectal cancer can be detected and often prevented. Most routine colorectal cancer screening begins at the age of 50 and continues through age 75. However, your caregiver may recommend screening at an earlier age if you  have risk factors for colon cancer. On a yearly basis, your caregiver may provide home test kits to check for hidden blood in the stool. Use of a small camera at the end of a tube, to directly examine the colon (sigmoidoscopy or colonoscopy), can detect the earliest forms of colorectal cancer. Talk to your caregiver about this at age 50, when routine screening begins. Direct examination of the colon should be repeated every 5 to 10 years through age 75, unless early forms of pre-cancerous polyps or small growths are found.  Hepatitis C blood testing is recommended for all people born from 1945 through 1965 and any individual with known risks for hepatitis C.  Practice safe sex. Use condoms and avoid high-risk sexual practices to reduce the spread of sexually transmitted infections (STIs). STIs include gonorrhea, chlamydia, syphilis, trichomonas, herpes, HPV, and human immunodeficiency virus (HIV). Herpes, HIV, and HPV are viral illnesses that have no cure. They can result in disability, cancer, and death.  A one-time screening for abdominal aortic aneurysm (AAA) and surgical repair of large AAAs by sound wave imaging (ultrasonography) is recommended for ages 65 to 75 years who are current or former smokers.  Healthy men should no longer receive prostate-specific antigen (PSA) blood tests as part of routine cancer screening. Consult with your caregiver about prostate cancer screening.  Testicular cancer screening is not recommended for adult males who have no symptoms. Screening includes self-exam, caregiver exam, and other screening tests. Consult with your caregiver about any symptoms you have or any concerns you have about testicular cancer.  Use sunscreen with skin protection factor (SPF) of 30 or more. Apply sunscreen liberally and repeatedly throughout the day. You should seek shade when your shadow is shorter than you. Protect yourself by wearing long sleeves, pants, a wide-brimmed hat, and  sunglasses year round, whenever you are outdoors.  Once a month, do a whole body skin exam, using a mirror to look at the skin on your back. Notify your caregiver of new moles, moles that have irregular borders, moles that are larger than a pencil eraser, or moles that have changed in shape or color.  Stay current with required immunizations.  Influenza. You need a dose every fall (or winter). The composition of the flu vaccine changes each year, so being vaccinated once is not enough.  Pneumococcal polysaccharide. You need 1 to 2 doses if you smoke cigarettes or if you have certain chronic medical conditions. You need 1 dose at age 65 (or older) if you have never been vaccinated.  Tetanus, diphtheria, pertussis (Tdap, Td). Get 1 dose of Tdap vaccine if you are younger than age 65 years, are over 65 and have contact with an infant, are a healthcare worker, or simply want to be protected from whooping cough. After that, you need a Td booster dose every 10 years. Consult your caregiver if you have not had at least 3 tetanus and diphtheria-containing shots sometime in your life or have a deep or dirty wound.  HPV. This vaccine is recommended   for males 13 through 77 years of age. This vaccine may be given to men 22 through 77 years of age who have not completed the 3 dose series. It is recommended for men through age 26 who have sex with men or whose immune system is weakened because of HIV infection, other illness, or medications. The vaccine is given in 3 doses over 6 months.  Measles, mumps, rubella (MMR). You need at least 1 dose of MMR if you were born in 1957 or later. You may also need a 2nd dose.  Meningococcal. If you are age 19 to 21 years and a first-year college student living in a residence hall, or have one of several medical conditions, you need to get vaccinated against meningococcal disease. You may also need additional booster doses.  Zoster (shingles). If you are age 60 years or  older, you should get this vaccine.  Varicella (chickenpox). If you have never had chickenpox or you were vaccinated but received only 1 dose, talk to your caregiver to find out if you need this vaccine.  Hepatitis A. You need this vaccine if you have a specific risk factor for hepatitis A virus infection, or you simply wish to be protected from this disease. The vaccine is usually given as 2 doses, 6 to 18 months apart.  Hepatitis B. You need this vaccine if you have a specific risk factor for hepatitis B virus infection or you simply wish to be protected from this disease. The vaccine is given in 3 doses, usually over 6 months. Preventative Service / Frequency Ages 19 to 39  Blood pressure check.** / Every 1 to 2 years.  Lipid and cholesterol check.** / Every 5 years beginning at age 20.  Hepatitis C blood test.** / For any individual with known risks for hepatitis C.  Skin self-exam. / Monthly.  Influenza immunization.** / Every year.  Pneumococcal polysaccharide immunization.** / 1 to 2 doses if you smoke cigarettes or if you have certain chronic medical conditions.  Tetanus, diphtheria, pertussis (Tdap,Td) immunization. / A one-time dose of Tdap vaccine. After that, you need a Td booster dose every 10 years.  HPV immunization. / 3 doses over 6 months, if 26 and younger.  Measles, mumps, rubella (MMR) immunization. / You need at least 1 dose of MMR if you were born in 1957 or later. You may also need a 2nd dose.  Meningococcal immunization. / 1 dose if you are age 19 to 21 years and a first-year college student living in a residence hall, or have one of several medical conditions, you need to get vaccinated against meningococcal disease. You may also need additional booster doses.  Varicella immunization.** / Consult your caregiver.  Hepatitis A immunization.** / Consult your caregiver. 2 doses, 6 to 18 months apart.  Hepatitis B immunization.** / Consult your caregiver. 3 doses  usually over 6 months. Ages 40 to 64  Blood pressure check.** / Every 1 to 2 years.  Lipid and cholesterol check.** / Every 5 years beginning at age 20.  Fecal occult blood test (FOBT) of stool. / Every year beginning at age 50 and continuing until age 75. You may not have to do this test if you get colonoscopy every 10 years.  Flexible sigmoidoscopy** or colonoscopy.** / Every 5 years for a flexible sigmoidoscopy or every 10 years for a colonoscopy beginning at age 50 and continuing until age 75.  Hepatitis C blood test.** / For all people born from 1945 through 1965 and any   individual with known risks for hepatitis C.  Skin self-exam. / Monthly.  Influenza immunization.** / Every year.  Pneumococcal polysaccharide immunization.** / 1 to 2 doses if you smoke cigarettes or if you have certain chronic medical conditions.  Tetanus, diphtheria, pertussis (Tdap/Td) immunization.** / A one-time dose of Tdap vaccine. After that, you need a Td booster dose every 10 years.  Measles, mumps, rubella (MMR) immunization. / You need at least 1 dose of MMR if you were born in 1957 or later. You may also need a 2nd dose.  Varicella immunization.**/ Consult your caregiver.  Meningococcal immunization.** / Consult your caregiver.  Hepatitis A immunization.** / Consult your caregiver. 2 doses, 6 to 18 months apart.  Hepatitis B immunization.** / Consult your caregiver. 3 doses, usually over 6 months. Ages 65 and over  Blood pressure check.** / Every 1 to 2 years.  Lipid and cholesterol check.**/ Every 5 years beginning at age 20.  Fecal occult blood test (FOBT) of stool. / Every year beginning at age 50 and continuing until age 75. You may not have to do this test if you get colonoscopy every 10 years.  Flexible sigmoidoscopy** or colonoscopy.** / Every 5 years for a flexible sigmoidoscopy or every 10 years for a colonoscopy beginning at age 50 and continuing until age 75.  Hepatitis C blood  test.** / For all people born from 1945 through 1965 and any individual with known risks for hepatitis C.  Abdominal aortic aneurysm (AAA) screening.** / A one-time screening for ages 65 to 75 years who are current or former smokers.  Skin self-exam. / Monthly.  Influenza immunization.** / Every year.  Pneumococcal polysaccharide immunization.** / 1 dose at age 65 (or older) if you have never been vaccinated.  Tetanus, diphtheria, pertussis (Tdap, Td) immunization. / A one-time dose of Tdap vaccine if you are over 65 and have contact with an infant, are a healthcare worker, or simply want to be protected from whooping cough. After that, you need a Td booster dose every 10 years.  Varicella immunization. ** / Consult your caregiver.  Meningococcal immunization.** / Consult your caregiver.  Hepatitis A immunization. ** / Consult your caregiver. 2 doses, 6 to 18 months apart.  Hepatitis B immunization.** / Check with your caregiver. 3 doses, usually over 6 months. **Family history and personal history of risk and conditions may change your caregiver's recommendations. Document Released: 08/20/2001 Document Revised: 09/16/2011 Document Reviewed: 11/19/2010 [employer] Patient Information 2013 [employer].  

## 2012-08-24 NOTE — Assessment & Plan Note (Signed)
Stable con't meds 

## 2012-08-24 NOTE — Assessment & Plan Note (Signed)
Check labs 

## 2012-08-24 NOTE — Progress Notes (Signed)
Subjective:    Louis Patel is a 79 y.o. male who presents for Medicare Annual/Subsequent preventive examination.   Preventive Screening-Counseling & Management  Tobacco History  Smoking status  . Former Smoker -- 1.00 packs/day for 20 years  . Types: Cigarettes  . Quit date: 08/24/1962  Smokeless tobacco  . Never Used    Problems Prior to Visit 1.   Current Problems (verified) Patient Active Problem List  Diagnosis  . MOLE  . HYPERLIPIDEMIA  . DEPRESSIVE DISORDER  . HYPERTENSION  . HEMORRHOIDS, EXTERNAL  . ALLERGIC RHINITIS CAUSE UNSPECIFIED  . PSA, INCREASED  . URI (upper respiratory infection)  . Memory loss    Medications Prior to Visit Current Outpatient Prescriptions on File Prior to Visit  Medication Sig Dispense Refill  . aspirin 81 MG EC tablet Take 81 mg by mouth daily.        Marland Kitchen atorvastatin (LIPITOR) 10 MG tablet Take 1 tablet (10 mg total) by mouth as directed.  90 tablet  0  . donepezil (ARICEPT) 5 MG tablet Take 1 tablet (5 mg total) by mouth at bedtime.  90 tablet  1  . finasteride (PROSCAR) 5 MG tablet Take 5 mg by mouth as directed.       . hydrochlorothiazide (HYDRODIURIL) 25 MG tablet Take 1 tablet (25 mg total) by mouth daily.  90 tablet  0  . levothyroxine (SYNTHROID, LEVOTHROID) 50 MCG tablet TAKE 1 TABLET BY MOUTH EVERY DAY  30 tablet  2   No current facility-administered medications on file prior to visit.    Current Medications (verified) Current Outpatient Prescriptions  Medication Sig Dispense Refill  . aspirin 81 MG EC tablet Take 81 mg by mouth daily.        Marland Kitchen atorvastatin (LIPITOR) 10 MG tablet Take 1 tablet (10 mg total) by mouth as directed.  90 tablet  0  . donepezil (ARICEPT) 5 MG tablet Take 1 tablet (5 mg total) by mouth at bedtime.  90 tablet  1  . finasteride (PROSCAR) 5 MG tablet Take 5 mg by mouth as directed.       . hydrochlorothiazide (HYDRODIURIL) 25 MG tablet Take 1 tablet (25 mg total) by mouth daily.  90 tablet   0  . levothyroxine (SYNTHROID, LEVOTHROID) 50 MCG tablet TAKE 1 TABLET BY MOUTH EVERY DAY  30 tablet  2   No current facility-administered medications for this visit.     Allergies (verified) Review of patient's allergies indicates no known allergies.   PAST HISTORY  Family History Family History  Problem Relation Age of Onset  . Stroke Mother   . Hypertension Mother     Social History History  Substance Use Topics  . Smoking status: Former Smoker -- 1.00 packs/day for 20 years    Types: Cigarettes    Quit date: 08/24/1962  . Smokeless tobacco: Never Used  . Alcohol Use: No    Are there smokers in your home (other than you)?  No  Risk Factors Current exercise habits: tennis  Dietary issues discussed: na   Cardiac risk factors: advanced age (older than 71 for men, 90 for women), dyslipidemia, hypertension and male gender.  Depression Screen (Note: if answer to either of the following is "Yes", a more complete depression screening is indicated)   Q1: Over the past two weeks, have you felt down, depressed or hopeless? No  Q2: Over the past two weeks, have you felt little interest or pleasure in doing things? No  Have you  lost interest or pleasure in daily life? No  Do you often feel hopeless? No  Do you cry easily over simple problems? No  Activities of Daily Living In your present state of health, do you have any difficulty performing the following activities?:  Driving? No Managing money?  No Feeding yourself? No Getting from bed to chair? No Climbing a flight of stairs? No Preparing food and eating?: No Bathing or showering? No Getting dressed: No Getting to the toilet? No Using the toilet:No Moving around from place to place: No In the past year have you fallen or had a near fall?:No   Are you sexually active?  No  Do you have more than one partner?  No  Hearing Difficulties: No Do you often ask people to speak up or repeat themselves? No Do you  experience ringing or noises in your ears? No Do you have difficulty understanding soft or whispered voices? No   Do you feel that you have a problem with memory? No  Do you often misplace items? No  Do you feel safe at home?  Yes  Cognitive Testing  Alert? Yes  Normal Appearance?Yes  Oriented to person? Yes  Place? Yes   Time? Yes  Recall of three objects?  Yes  Can perform simple calculations? Yes  Displays appropriate judgment?Yes  Can read the correct time from a watch face?Yes   Advanced Directives have been discussed with the patient? Yes   List the Names of Other Physician/Practitioners you currently use: 1.  [facility]rology--  [facility]-- [facility] 2.  opth-- [facility] 3.  Dentist-- sees one-- cant remember name  Indicate any recent Medical Services you may have received from other than [facility] providers in the past year (date may be approximate).  Immunization History  Administered Date(s) Administered  . Influenza Split 04/25/2011, 04/30/2012  . Influenza Whole 05/14/2005, 05/06/2007, 05/04/2008, 04/24/2009, 05/22/2010  . Pneumococcal Polysaccharide 04/24/1999  . Tdap 03/14/2011  . Zoster 12/27/2009    Screening Tests Health Maintenance  Topic Date Due  . Influenza Vaccine  03/08/2013  . Colonoscopy  12/30/2018  . Tetanus/tdap  03/13/2021  . Pneumococcal Polysaccharide Vaccine Age 45 And Over  Completed  . Zostavax  Completed    All answers were reviewed with the patient and necessary referrals were made:  Loreen Freud, DO   08/24/2012   History reviewed:  He  has a past medical history of Hyperlipidemia and Hypertension. He  does not have any pertinent problems on file. He  has past surgical history that includes Eye surgery (07/18/08) and Prostate surgery (3/08). His family history includes Hypertension in his mother and Stroke in his mother. He  reports that he quit smoking about 50 years ago. His smoking use included Cigarettes. He has a 20 pack-year smoking history.  He has never used smokeless tobacco. He reports that he does not drink alcohol or use illicit drugs. He has a current medication list which includes the following prescription(s): aspirin, atorvastatin, donepezil, finasteride, hydrochlorothiazide, and levothyroxine. Current Outpatient Prescriptions on File Prior to Visit  Medication Sig Dispense Refill  . aspirin 81 MG EC tablet Take 81 mg by mouth daily.        Marland Kitchen atorvastatin (LIPITOR) 10 MG tablet Take 1 tablet (10 mg total) by mouth as directed.  90 tablet  0  . donepezil (ARICEPT) 5 MG tablet Take 1 tablet (5 mg total) by mouth at bedtime.  90 tablet  1  . finasteride (PROSCAR) 5 MG tablet Take 5 mg  by mouth as directed.       . hydrochlorothiazide (HYDRODIURIL) 25 MG tablet Take 1 tablet (25 mg total) by mouth daily.  90 tablet  0  . levothyroxine (SYNTHROID, LEVOTHROID) 50 MCG tablet TAKE 1 TABLET BY MOUTH EVERY DAY  30 tablet  2   No current facility-administered medications on file prior to visit.   He has No Known Allergies.  Review of Systems  Review of Systems  Constitutional: Negative for activity change, appetite change and fatigue.  HENT: Negative for hearing loss, congestion, tinnitus and ear discharge.   Eyes: Negative for visual disturbance (see optho q1y -- vision corrected to 20/20 with glasses).  Respiratory: Negative for cough, chest tightness and shortness of breath.   Cardiovascular: Negative for chest pain, palpitations and leg swelling.  Gastrointestinal: Negative for abdominal pain, diarrhea, constipation and abdominal distention.  Genitourinary: Negative for urgency, frequency, decreased urine volume and difficulty urinating.  Musculoskeletal: Negative for back pain, arthralgias and gait problem.  Skin: Negative for color change, pallor and rash.  Neurological: Negative for dizziness, light-headedness, numbness and headaches.  Hematological: Negative for adenopathy. Does not bruise/bleed easily.   Psychiatric/Behavioral: Negative for suicidal ideas, confusion, sleep disturbance, self-injury, dysphoric mood, decreased concentration and agitation.  Pt is able to read and write and can do all ADLs No risk for falling No abuse/ violence in home     Objective:     Vision by Snellen chart:opth ,Blood pressure 140/80, pulse 90, temperature 98.1 F (36.7 C), temperature source Oral, height 5' 4.5" (1.638 m), weight 211 lb 3.2 oz (95.8 kg), SpO2 97.00%. Body mass index is 35.71 kg/(m^2).  BP 140/80  Pulse 90  Temp(Src) 98.1 F (36.7 C) (Oral)  Ht 5' 4.5" (1.638 m)  Wt 211 lb 3.2 oz (95.8 kg)  BMI 35.71 kg/m2  SpO2 97% General appearance: alert, cooperative, appears stated age and no distress Head: Normocephalic, without obvious abnormality, atraumatic Eyes: conjunctivae/corneas clear. PERRL, EOM's intact. Fundi benign. Ears: normal TM's and external ear canals both ears Nose: Nares normal. Septum midline. Mucosa normal. No drainage or sinus tenderness. Throat: lips, mucosa, and tongue normal; teeth and gums normal Neck: no adenopathy, no carotid bruit, no JVD, supple, symmetrical, trachea midline and thyroid not enlarged, symmetric, no tenderness/mass/nodules Back: symmetric, no curvature. ROM normal. No CVA tenderness. Lungs: clear to auscultation bilaterally Chest wall: no tenderness Heart: regular rate and rhythm, S1, S2 normal, no murmur, click, rub or gallop Abdomen: soft, non-tender; bowel sounds normal; no masses,  no organomegaly Male genitalia: urology Rectal: urology Extremities: extremities normal, atraumatic, no cyanosis or edema Pulses: 2+ and symmetric Skin: Skin color, texture, turgor normal. No rashes or lesions Lymph nodes: Cervical, supraclavicular, and axillary nodes normal. Neurologic: Alert and oriented X 3, normal strength and tone. Normal symmetric reflexes. Normal coordination and gait Psych-pt denies depression, no anxiety     Assessment:      cpe      Plan:     During the course of the visit the patient was educated and counseled about appropriate screening and preventive services including:    Pneumococcal vaccine   Prostate cancer screening  Colorectal cancer screening  Diabetes screening  Advanced directives: has an advanced directive - a copy HAS NOT been provided. x Diet review for nutrition referral? Yes ____  Not Indicated _x___   Patient Instructions (the written plan) was given to the patient.  Medicare Attestation I have personally reviewed: The patient's medical and social history Their use of  alcohol, tobacco or illicit drugs Their current medications and supplements The patient's functional ability including ADLs,fall risks, home safety risks, cognitive, and hearing and visual impairment Diet and physical activities Evidence for depression or mood disorders  The patient's weight, height, BMI, and visual acuity have been recorded in the chart.  I have made referrals, counseling, and provided education to the patient based on review of the above and I have provided the patient with a written personalized care plan for preventive services.     Loreen Freud, DO   08/24/2012

## 2012-08-24 NOTE — Assessment & Plan Note (Signed)
Per u rology 

## 2012-08-25 ENCOUNTER — Other Ambulatory Visit (INDEPENDENT_AMBULATORY_CARE_PROVIDER_SITE_OTHER): Payer: Medicare Other

## 2012-08-25 DIAGNOSIS — E039 Hypothyroidism, unspecified: Secondary | ICD-10-CM

## 2012-08-25 DIAGNOSIS — I10 Essential (primary) hypertension: Secondary | ICD-10-CM

## 2012-08-25 DIAGNOSIS — E785 Hyperlipidemia, unspecified: Secondary | ICD-10-CM

## 2012-08-25 LAB — HEPATIC FUNCTION PANEL
ALT: 20 U/L (ref 0–53)
AST: 18 U/L (ref 0–37)
Total Bilirubin: 1 mg/dL (ref 0.3–1.2)
Total Protein: 7.2 g/dL (ref 6.0–8.3)

## 2012-08-25 LAB — TSH: TSH: 4.07 u[IU]/mL (ref 0.35–5.50)

## 2012-08-25 LAB — LIPID PANEL
Cholesterol: 148 mg/dL (ref 0–200)
HDL: 47.6 mg/dL (ref >39.00)
Triglycerides: 86 mg/dL (ref 0.0–149.0)

## 2012-08-25 LAB — BASIC METABOLIC PANEL
Calcium: 9 mg/dL (ref 8.4–10.5)
GFR: 74.72 mL/min (ref >60.00)
Glucose, Bld: 100 mg/dL — ABNORMAL HIGH (ref 70–99)
Sodium: 138 mEq/L (ref 135–145)

## 2012-08-25 LAB — CBC WITH DIFFERENTIAL/PLATELET
Basophils Absolute: 0 10*3/uL (ref 0.0–0.1)
Hemoglobin: 15.3 g/dL (ref 13.0–17.0)
Lymphocytes Relative: 17.5 % (ref 12.0–46.0)
Monocytes Relative: 8.5 % (ref 3.0–12.0)
Platelets: 195 10*3/uL (ref 150.0–400.0)
RDW: 14.3 % (ref 11.5–14.6)

## 2012-09-25 ENCOUNTER — Other Ambulatory Visit: Payer: Self-pay | Admitting: Family Medicine

## 2012-10-16 ENCOUNTER — Encounter: Payer: Self-pay | Admitting: Family Medicine

## 2012-10-16 ENCOUNTER — Ambulatory Visit (INDEPENDENT_AMBULATORY_CARE_PROVIDER_SITE_OTHER): Payer: Medicare Other | Admitting: Family Medicine

## 2012-10-16 VITALS — BP 110/68 | HR 96 | Temp 98.7°F | Wt 207.4 lb

## 2012-10-16 DIAGNOSIS — H6123 Impacted cerumen, bilateral: Secondary | ICD-10-CM

## 2012-10-16 DIAGNOSIS — H6120 Impacted cerumen, unspecified ear: Secondary | ICD-10-CM

## 2012-10-16 NOTE — Patient Instructions (Addendum)
Cerumen Impaction  A cerumen impaction is when the wax in your ear forms a plug. This plug usually causes reduced hearing. Sometimes it also causes an earache or dizziness. Removing a cerumen impaction can be difficult and painful. The wax sticks to the ear canal. The canal is sensitive and bleeds easily. If you try to remove a heavy wax buildup with a cotton tipped swab, you may push it in further.  Irrigation with water, suction, and small ear curettes may be used to clear out the wax. If the impaction is fixed to the skin in the ear canal, ear drops may be needed for a few days to loosen the wax. People who build up a lot of wax frequently can use ear wax removal products available in your local drugstore.  SEEK MEDICAL CARE IF:    You develop an earache, increased hearing loss, or marked dizziness.  Document Released: 08/01/2004 Document Revised: 09/16/2011 Document Reviewed: 09/21/2009  [employer] Patient Information 2013 [employer]

## 2012-10-16 NOTE — Progress Notes (Signed)
  Subjective:    Patient ID: Louis Patel, male    DOB: 12/28/1925, 79 y.o.   MRN: [id number]  HPI Pt here c/o dec hearing b/l ears x several weeks.  No other complaints.    Review of Systems    as above  Objective:   Physical Exam  BP 110/68  Pulse 96  Temp(Src) 98.7 F (37.1 C) (Oral)  Wt 207 lb 6.4 oz (94.076 kg)  BMI 35.06 kg/m2  SpO2 96% General appearance: alert, cooperative, appears stated age and no distress Ears: + b/l cerumen impaction ---irrigated sucessfully ---TMI b/l      Assessment & Plan:  Cerumen impaction----  Resolved                                        Pt told not to use q tips and may use debrox prn                                         rto prn

## 2012-11-25 ENCOUNTER — Other Ambulatory Visit: Payer: Self-pay | Admitting: General Practice

## 2012-11-25 MED ORDER — HYDROCHLOROTHIAZIDE 25 MG PO TABS: 25.0000 mg | ORAL_TABLET | Freq: Every day | ORAL | Status: DC

## 2012-12-02 ENCOUNTER — Telehealth: Payer: Self-pay | Admitting: General Practice

## 2012-12-02 MED ORDER — HYDROCHLOROTHIAZIDE 25 MG PO TABS: 25.0000 mg | ORAL_TABLET | Freq: Every day | ORAL | Status: DC

## 2012-12-02 NOTE — Telephone Encounter (Signed)
Med filled to [facility] instead as requested.

## 2012-12-08 ENCOUNTER — Telehealth: Payer: Self-pay | Admitting: Family Medicine

## 2012-12-08 NOTE — Telephone Encounter (Signed)
miralax per directions on bottle

## 2012-12-08 NOTE — Telephone Encounter (Signed)
Patient states that he is constipated and is wanting a recommendation over the phone about what kind of laxative he should take.

## 2012-12-08 NOTE — Telephone Encounter (Signed)
Spoke with patient and he voiced understanding. He agreed to call if constipation is not resolved     KP

## 2012-12-08 NOTE — Telephone Encounter (Signed)
Please advise      KP 

## 2013-01-28 ENCOUNTER — Other Ambulatory Visit: Payer: Self-pay | Admitting: Family Medicine

## 2013-02-20 ENCOUNTER — Other Ambulatory Visit: Payer: Self-pay | Admitting: Family Medicine

## 2013-02-22 ENCOUNTER — Encounter: Payer: Self-pay | Admitting: Family Medicine

## 2013-02-22 ENCOUNTER — Ambulatory Visit (INDEPENDENT_AMBULATORY_CARE_PROVIDER_SITE_OTHER): Payer: Medicare Other | Admitting: Family Medicine

## 2013-02-22 VITALS — BP 120/74 | HR 82 | Temp 98.6°F | Wt 203.2 lb

## 2013-02-22 DIAGNOSIS — F0280 Dementia in other diseases classified elsewhere without behavioral disturbance: Secondary | ICD-10-CM

## 2013-02-22 DIAGNOSIS — R413 Other amnesia: Secondary | ICD-10-CM

## 2013-02-22 DIAGNOSIS — I10 Essential (primary) hypertension: Secondary | ICD-10-CM

## 2013-02-22 DIAGNOSIS — E669 Obesity, unspecified: Secondary | ICD-10-CM | POA: Insufficient documentation

## 2013-02-22 DIAGNOSIS — E785 Hyperlipidemia, unspecified: Secondary | ICD-10-CM

## 2013-02-22 MED ORDER — DONEPEZIL HCL 10 MG PO TABS: 10.0000 mg | ORAL_TABLET | Freq: Every evening | ORAL | Status: DC | PRN

## 2013-02-22 NOTE — Progress Notes (Signed)
  Subjective:    Patient here for follow-up of elevated blood pressure.  He is exercising and is adherent to a low-salt diet.  Blood pressure is well controlled at home. Cardiac symptoms: none. Patient denies: chest pain, chest pressure/discomfort, claudication, dyspnea, exertional chest pressure/discomfort, fatigue, irregular heart beat, lower extremity edema, near-syncope, orthopnea, palpitations, paroxysmal nocturnal dyspnea, syncope and tachypnea. Cardiovascular risk factors: advanced age (older than 51 for men, 34 for women), dyslipidemia, hypertension, male gender and obesity (BMI >= 30 kg/m2). Use of agents associated with hypertension: none. History of target organ damage: none.  Pt daughter is with him today. She states he has had a few episodes of getting lost while driving.  They have had to go get him.  He has been able to call.  He is also here for cholesterol check.  Pt is having no trouble with meds.   The following portions of the patient's history were reviewed and updated as appropriate: allergies, current medications, past family history, past medical history, past social history, past surgical history and problem list.  Review of Systems Pertinent items are noted in HPI.     Objective:    BP 120/74  Pulse 82  Temp(Src) 98.6 F (37 C) (Oral)  Wt 203 lb 3.2 oz (92.171 kg)  BMI 34.35 kg/m2  SpO2 97% General appearance: alert, cooperative, appears stated age and no distress Neck: no adenopathy, no carotid bruit, no JVD, supple, symmetrical, trachea midline and thyroid not enlarged, symmetric, no tenderness/mass/nodules Lungs: clear to auscultation bilaterally Heart: S1, S2 normal Extremities: extremities normal, atraumatic, no cyanosis or edema    Assessment:    Hypertension, normal blood pressure . Evidence of target organ damage: none.    Plan:    Medication: no change. Dietary sodium restriction. Regular aerobic exercise. Check blood pressures 2-3 times weekly  and record. Follow up: 6 months and as needed.

## 2013-02-22 NOTE — Assessment & Plan Note (Signed)
Inc aricept to 10 mg daily

## 2013-02-22 NOTE — Assessment & Plan Note (Signed)
Check labs con't meds 

## 2013-02-22 NOTE — Patient Instructions (Signed)

## 2013-02-23 ENCOUNTER — Other Ambulatory Visit (INDEPENDENT_AMBULATORY_CARE_PROVIDER_SITE_OTHER): Payer: Medicare Other

## 2013-02-23 DIAGNOSIS — E785 Hyperlipidemia, unspecified: Secondary | ICD-10-CM

## 2013-02-23 DIAGNOSIS — I10 Essential (primary) hypertension: Secondary | ICD-10-CM

## 2013-02-23 LAB — HEPATIC FUNCTION PANEL
ALT: 15 U/L (ref 0–53)
AST: 13 U/L (ref 0–37)
Alkaline Phosphatase: 59 U/L (ref 39–117)
Bilirubin, Direct: 0.1 mg/dL (ref 0.0–0.3)
Total Bilirubin: 0.9 mg/dL (ref 0.3–1.2)

## 2013-02-23 LAB — BASIC METABOLIC PANEL
BUN: 22 mg/dL (ref 6–23)
Calcium: 8.7 mg/dL (ref 8.4–10.5)
Creatinine, Ser: 1 mg/dL (ref 0.4–1.5)
GFR: 76.38 mL/min (ref >60.00)
Glucose, Bld: 93 mg/dL (ref 70–99)

## 2013-02-23 LAB — LIPID PANEL: VLDL: 20.2 mg/dL (ref 0.0–40.0)

## 2013-05-13 ENCOUNTER — Other Ambulatory Visit: Payer: Self-pay

## 2013-08-26 ENCOUNTER — Ambulatory Visit: Payer: Medicare Other | Admitting: Family Medicine

## 2013-09-13 ENCOUNTER — Encounter: Payer: Self-pay | Admitting: Family Medicine

## 2013-09-13 ENCOUNTER — Ambulatory Visit (INDEPENDENT_AMBULATORY_CARE_PROVIDER_SITE_OTHER): Payer: Medicare Other | Admitting: Family Medicine

## 2013-09-13 VITALS — BP 130/82 | HR 57 | Temp 98.3°F | Wt 207.0 lb

## 2013-09-13 DIAGNOSIS — E785 Hyperlipidemia, unspecified: Secondary | ICD-10-CM

## 2013-09-13 DIAGNOSIS — I10 Essential (primary) hypertension: Secondary | ICD-10-CM

## 2013-09-13 LAB — CBC WITH DIFFERENTIAL/PLATELET
BASOS PCT: 0.3 % (ref 0.0–3.0)
Basophils Absolute: 0 10*3/uL (ref 0.0–0.1)
EOS ABS: 0.2 10*3/uL (ref 0.0–0.7)
EOS PCT: 2.6 % (ref 0.0–5.0)
HCT: 47.9 % (ref 39.0–52.0)
HEMOGLOBIN: 15.4 g/dL (ref 13.0–17.0)
LYMPHS PCT: 16.4 % (ref 12.0–46.0)
Lymphs Abs: 1.3 10*3/uL (ref 0.7–4.0)
MCHC: 32.1 g/dL (ref 30.0–36.0)
MCV: 89.1 fl (ref 78.0–100.0)
Monocytes Absolute: 0.6 10*3/uL (ref 0.1–1.0)
Monocytes Relative: 8 % (ref 3.0–12.0)
NEUTROS ABS: 5.7 10*3/uL (ref 1.4–7.7)
NEUTROS PCT: 72.7 % (ref 43.0–77.0)
Platelets: 201 10*3/uL (ref 150.0–400.0)
RBC: 5.37 Mil/uL (ref 4.22–5.81)
RDW: 14.4 % (ref 11.5–14.6)
WBC: 7.8 10*3/uL (ref 4.5–10.5)

## 2013-09-13 LAB — LIPID PANEL
CHOLESTEROL: 197 mg/dL (ref 0–200)
HDL: 48.7 mg/dL (ref >39.00)
LDL Cholesterol: 126 mg/dL — ABNORMAL HIGH (ref 0–99)
Total CHOL/HDL Ratio: 4
Triglycerides: 114 mg/dL (ref 0.0–149.0)
VLDL: 22.8 mg/dL (ref 0.0–40.0)

## 2013-09-13 LAB — HEPATIC FUNCTION PANEL
ALBUMIN: 4 g/dL (ref 3.5–5.2)
ALT: 18 U/L (ref 0–53)
AST: 18 U/L (ref 0–37)
Alkaline Phosphatase: 59 U/L (ref 39–117)
Bilirubin, Direct: 0.1 mg/dL (ref 0.0–0.3)
TOTAL PROTEIN: 7 g/dL (ref 6.0–8.3)
Total Bilirubin: 0.7 mg/dL (ref 0.3–1.2)

## 2013-09-13 LAB — BASIC METABOLIC PANEL
BUN: 24 mg/dL — ABNORMAL HIGH (ref 6–23)
CALCIUM: 9 mg/dL (ref 8.4–10.5)
CHLORIDE: 103 meq/L (ref 96–112)
CO2: 28 meq/L (ref 19–32)
Creatinine, Ser: 1 mg/dL (ref 0.4–1.5)
GFR: 79.03 mL/min (ref >60.00)
Glucose, Bld: 83 mg/dL (ref 70–99)
Potassium: 4.2 mEq/L (ref 3.5–5.1)
SODIUM: 138 meq/L (ref 135–145)

## 2013-09-13 NOTE — Patient Instructions (Signed)

## 2013-09-13 NOTE — Progress Notes (Signed)
Pre visit review using our clinic review tool, if applicable. No additional management support is needed unless otherwise documented below in the visit note. 

## 2013-09-13 NOTE — Assessment & Plan Note (Signed)
Pt stopped taking meds a while ago Check labs today Will restart med if lipids still abnormal

## 2013-09-13 NOTE — Progress Notes (Signed)
  Subjective:    Patient here for follow-up of elevated blood pressure.  He is exercising and is adherent to a low-salt diet.  Blood pressure is well controlled at home. Cardiac symptoms: none. Patient denies: chest pain, chest pressure/discomfort, claudication, dyspnea, exertional chest pressure/discomfort, fatigue, irregular heart beat, lower extremity edema, near-syncope, orthopnea, palpitations, paroxysmal nocturnal dyspnea, syncope and tachypnea. Cardiovascular risk factors: advanced age (older than 76 for men, 12 for women), dyslipidemia, hypertension and male gender. Use of agents associated with hypertension: none. History of target organ damage: none.  The following portions of the patient's history were reviewed and updated as appropriate: allergies, current medications, past family history, past medical history, past social history, past surgical history and problem list.  Review of Systems Pertinent items are noted in HPI.     Objective:    BP 130/82  Pulse 57  Temp(Src) 98.3 F (36.8 C) (Oral)  Wt 207 lb (93.895 kg)  SpO2 97% General appearance: alert, cooperative, appears stated age and no distress Throat: lips, mucosa, and tongue normal; teeth and gums normal Neck: no adenopathy, no carotid bruit, no JVD, supple, symmetrical, trachea midline and thyroid not enlarged, symmetric, no tenderness/mass/nodules Lungs: clear to auscultation bilaterally Heart: S1, S2 normal Extremities: extremities normal, atraumatic, no cyanosis or edema    Assessment:    Hypertension, normal blood pressure . Evidence of target organ damage: none.    Plan:    Medication: no change. Dietary sodium restriction. Regular aerobic exercise. Follow up: 6 months and as needed.

## 2013-09-14 ENCOUNTER — Telehealth: Payer: Self-pay | Admitting: Family Medicine

## 2013-09-14 NOTE — Telephone Encounter (Signed)
Relevant patient education assigned to patient using Emmi. ° °

## 2013-09-16 ENCOUNTER — Telehealth: Payer: Self-pay

## 2013-09-16 MED ORDER — ATORVASTATIN CALCIUM 10 MG PO TABS: 10.0000 mg | ORAL_TABLET | Freq: Every day | ORAL | Status: DC

## 2013-09-16 NOTE — Telephone Encounter (Signed)
Pt really needs to restart the lipitor 10 mg #90 1 refill 1 a night  Recheck 3 months   Discussed with patient and he voiced understanding, he has agreed to re-start the Lipitor. Rx sent and copy mailed      KP

## 2014-02-20 ENCOUNTER — Other Ambulatory Visit: Payer: Self-pay | Admitting: Family Medicine

## 2014-03-16 ENCOUNTER — Ambulatory Visit: Payer: Medicare Other | Admitting: Family Medicine

## 2014-03-16 ENCOUNTER — Other Ambulatory Visit: Payer: Self-pay | Admitting: Family Medicine

## 2014-03-17 ENCOUNTER — Ambulatory Visit: Payer: Medicare Other | Admitting: Family Medicine

## 2014-04-07 ENCOUNTER — Other Ambulatory Visit: Payer: Self-pay | Admitting: Family Medicine

## 2014-05-10 ENCOUNTER — Ambulatory Visit (INDEPENDENT_AMBULATORY_CARE_PROVIDER_SITE_OTHER): Payer: Medicare Other | Admitting: Family Medicine

## 2014-05-10 ENCOUNTER — Encounter: Payer: Self-pay | Admitting: Family Medicine

## 2014-05-10 VITALS — BP 136/87 | HR 88 | Temp 97.8°F | Wt 203.2 lb

## 2014-05-10 DIAGNOSIS — I10 Essential (primary) hypertension: Secondary | ICD-10-CM

## 2014-05-10 DIAGNOSIS — Z23 Encounter for immunization: Secondary | ICD-10-CM

## 2014-05-10 DIAGNOSIS — E785 Hyperlipidemia, unspecified: Secondary | ICD-10-CM

## 2014-05-10 DIAGNOSIS — E038 Other specified hypothyroidism: Secondary | ICD-10-CM

## 2014-05-10 LAB — BASIC METABOLIC PANEL
BUN: 24 mg/dL — AB (ref 6–23)
CHLORIDE: 100 meq/L (ref 96–112)
CO2: 29 meq/L (ref 19–32)
CREATININE: 1.1 mg/dL (ref 0.4–1.5)
Calcium: 9.3 mg/dL (ref 8.4–10.5)
GFR: 71.16 mL/min (ref >60.00)
Glucose, Bld: 97 mg/dL (ref 70–99)
Potassium: 4.9 mEq/L (ref 3.5–5.1)
Sodium: 138 mEq/L (ref 135–145)

## 2014-05-10 LAB — HEPATIC FUNCTION PANEL
ALT: 22 U/L (ref 0–53)
AST: 21 U/L (ref 0–37)
Albumin: 3.6 g/dL (ref 3.5–5.2)
Alkaline Phosphatase: 74 U/L (ref 39–117)
Bilirubin, Direct: 0.2 mg/dL (ref 0.0–0.3)
Total Bilirubin: 1 mg/dL (ref 0.2–1.2)
Total Protein: 7.3 g/dL (ref 6.0–8.3)

## 2014-05-10 LAB — LIPID PANEL
CHOLESTEROL: 176 mg/dL (ref 0–200)
HDL: 53.3 mg/dL (ref >39.00)
LDL Cholesterol: 102 mg/dL — ABNORMAL HIGH (ref 0–99)
NonHDL: 122.7
TRIGLYCERIDES: 102 mg/dL (ref 0.0–149.0)
Total CHOL/HDL Ratio: 3
VLDL: 20.4 mg/dL (ref 0.0–40.0)

## 2014-05-10 LAB — TSH: TSH: 2.92 u[IU]/mL (ref 0.35–4.50)

## 2014-05-10 MED ORDER — ATORVASTATIN CALCIUM 10 MG PO TABS: ORAL_TABLET | ORAL | Status: DC

## 2014-05-10 NOTE — Patient Instructions (Signed)

## 2014-05-10 NOTE — Progress Notes (Signed)
Pt tolerated injection well.  No signs of a reaction.

## 2014-05-10 NOTE — Progress Notes (Signed)
Pre visit review using our clinic review tool, if applicable. No additional management support is needed unless otherwise documented below in the visit note. 

## 2014-05-10 NOTE — Progress Notes (Signed)
   Subjective:    Patient ID: Louis Patel, male    DOB: 08/17/1925, 80 y.o.   MRN: [id number]  HPI Pt here with her daughter to f/u and have labs.  No complaints.  Things with memory are stable.  Hey help him with his meds.  He does live alone but has 3 children that look in on him regularly.     Review of Systems As above    Objective:   Physical Exam  BP 136/87 mmHg  Pulse 88  Temp(Src) 97.8 F (36.6 C) (Oral)  Wt 203 lb 3.2 oz (92.171 kg)  SpO2 97% General appearance: alert, cooperative, appears stated age and no distress Lungs: clear to auscultation bilaterally Heart: S1, S2 normal Extremities: extremities normal, atraumatic, no cyanosis or edema      Assessment & Plan:  1. Need for prophylactic vaccination and inoculation against influenza  - Flu vaccine HIGH DOSE PF (Fluzone Tri High dose)  2. Hyperlipidemia Check labs, con't meds---  - Hepatic function panel - Lipid panel  3. Other specified hypothyroidism Stable, check labs - TSH  4. Essential hypertension Stable, con't meds - Basic metabolic panel

## 2014-08-23 ENCOUNTER — Other Ambulatory Visit: Payer: Self-pay | Admitting: Family Medicine

## 2014-10-10 ENCOUNTER — Emergency Department (HOSPITAL_BASED_OUTPATIENT_CLINIC_OR_DEPARTMENT_OTHER)
Admission: EM | Admit: 2014-10-10 | Discharge: 2014-10-11 | Disposition: A | Discharge status: Home / Self Care | Payer: Medicare Other | Attending: Emergency Medicine | Admitting: Emergency Medicine

## 2014-10-10 ENCOUNTER — Encounter (HOSPITAL_BASED_OUTPATIENT_CLINIC_OR_DEPARTMENT_OTHER): Payer: Self-pay

## 2014-10-10 ENCOUNTER — Telehealth: Payer: Self-pay | Admitting: Family Medicine

## 2014-10-10 ENCOUNTER — Emergency Department (HOSPITAL_BASED_OUTPATIENT_CLINIC_OR_DEPARTMENT_OTHER): Payer: Medicare Other

## 2014-10-10 DIAGNOSIS — E785 Hyperlipidemia, unspecified: Secondary | ICD-10-CM | POA: Diagnosis not present

## 2014-10-10 DIAGNOSIS — I10 Essential (primary) hypertension: Secondary | ICD-10-CM | POA: Insufficient documentation

## 2014-10-10 DIAGNOSIS — R339 Retention of urine, unspecified: Secondary | ICD-10-CM

## 2014-10-10 DIAGNOSIS — R197 Diarrhea, unspecified: Secondary | ICD-10-CM | POA: Diagnosis present

## 2014-10-10 DIAGNOSIS — Z79899 Other long term (current) drug therapy: Secondary | ICD-10-CM | POA: Diagnosis not present

## 2014-10-10 DIAGNOSIS — Z87891 Personal history of nicotine dependence: Secondary | ICD-10-CM | POA: Diagnosis not present

## 2014-10-10 DIAGNOSIS — K5641 Fecal impaction: Secondary | ICD-10-CM | POA: Diagnosis not present

## 2014-10-10 DIAGNOSIS — Z7982 Long term (current) use of aspirin: Secondary | ICD-10-CM | POA: Diagnosis not present

## 2014-10-10 LAB — CBC WITH DIFFERENTIAL/PLATELET
BASOS PCT: 0 % (ref 0–1)
Basophils Absolute: 0 10*3/uL (ref 0.0–0.1)
EOS ABS: 0.1 10*3/uL (ref 0.0–0.7)
Eosinophils Relative: 1 % (ref 0–5)
HCT: 45.5 % (ref 39.0–52.0)
HEMOGLOBIN: 15.5 g/dL (ref 13.0–17.0)
Lymphocytes Relative: 11 % — ABNORMAL LOW (ref 12–46)
Lymphs Abs: 1.3 10*3/uL (ref 0.7–4.0)
MCH: 29.1 pg (ref 26.0–34.0)
MCHC: 34.1 g/dL (ref 30.0–36.0)
MCV: 85.5 fL (ref 78.0–100.0)
MONO ABS: 0.9 10*3/uL (ref 0.1–1.0)
MONOS PCT: 8 % (ref 3–12)
Neutro Abs: 9.8 10*3/uL — ABNORMAL HIGH (ref 1.7–7.7)
Neutrophils Relative %: 80 % — ABNORMAL HIGH (ref 43–77)
Platelets: 194 10*3/uL (ref 150–400)
RBC: 5.32 MIL/uL (ref 4.22–5.81)
RDW: 14.2 % (ref 11.5–15.5)
WBC: 12 10*3/uL — ABNORMAL HIGH (ref 4.0–10.5)

## 2014-10-10 LAB — COMPREHENSIVE METABOLIC PANEL
ALT: 26 U/L (ref 0–53)
AST: 22 U/L (ref 0–37)
Albumin: 3.9 g/dL (ref 3.5–5.2)
Alkaline Phosphatase: 73 U/L (ref 39–117)
Anion gap: 7 (ref 5–15)
BUN: 27 mg/dL — ABNORMAL HIGH (ref 6–23)
CALCIUM: 8.9 mg/dL (ref 8.4–10.5)
CO2: 28 mmol/L (ref 19–32)
Chloride: 102 mmol/L (ref 96–112)
Creatinine, Ser: 0.96 mg/dL (ref 0.50–1.35)
GFR calc non Af Amer: 73 mL/min — ABNORMAL LOW (ref >90)
GFR, EST AFRICAN AMERICAN: 85 mL/min — AB (ref >90)
Glucose, Bld: 123 mg/dL — ABNORMAL HIGH (ref 70–99)
Potassium: 4 mmol/L (ref 3.5–5.1)
SODIUM: 137 mmol/L (ref 135–145)
Total Bilirubin: 0.5 mg/dL (ref 0.3–1.2)
Total Protein: 7 g/dL (ref 6.0–8.3)

## 2014-10-10 LAB — OCCULT BLOOD X 1 CARD TO LAB, STOOL: Fecal Occult Bld: POSITIVE — AB

## 2014-10-10 LAB — URINALYSIS, ROUTINE W REFLEX MICROSCOPIC
BILIRUBIN URINE: NEGATIVE
Glucose, UA: NEGATIVE mg/dL
Ketones, ur: NEGATIVE mg/dL
Nitrite: POSITIVE — AB
PH: 6 (ref 5.0–8.0)
Protein, ur: NEGATIVE mg/dL
SPECIFIC GRAVITY, URINE: 1.021 (ref 1.005–1.030)
UROBILINOGEN UA: 0.2 mg/dL (ref 0.0–1.0)

## 2014-10-10 LAB — URINE MICROSCOPIC-ADD ON

## 2014-10-10 MED ORDER — SODIUM CHLORIDE 0.9 % IV BOLUS (SEPSIS)
500.0000 mL | Freq: Once | INTRAVENOUS | Status: AC
  Administered 2014-10-10: 500 mL via INTRAVENOUS

## 2014-10-10 MED ORDER — IOHEXOL 300 MG/ML  SOLN
100.0000 mL | Freq: Once | INTRAMUSCULAR | Status: AC | PRN
  Administered 2014-10-10: 100 mL via INTRAVENOUS

## 2014-10-10 MED ORDER — IOHEXOL 300 MG/ML  SOLN
25.0000 mL | Freq: Once | INTRAMUSCULAR | Status: AC | PRN
  Administered 2014-10-10: 25 mL via ORAL

## 2014-10-10 NOTE — Telephone Encounter (Signed)
[facility] - Client [facility] Patient Name: Louis Patel DOB: February 25, 1926 Initial Comment Caller states father is having blood in his stool. Nurse Assessment Nurse: Genoveva Ill, RN, Lattie Haw Date/Time (Eastern Time): 10/10/2014 4:18:57 PM Confirm and document reason for call. If symptomatic, describe symptoms. ---caller states father has had 6 bowel movements with blood today and isn't drinking much; disoriented more than usual Has the patient traveled out of the country within the last 30 days? ---No Does the patient require triage? ---Yes Related visit to physician within the last 2 weeks? ---No Does the PT have any chronic conditions? (i.e. diabetes, asthma, etc.) ---Yes List chronic conditions. ---high cholesterol, thyroid issue, dementia Guidelines Guideline Title Affirmed Question Affirmed Notes Rectal Bleeding Difficult to awaken or acting confused (e.g., disoriented, slurred speech) Final Disposition User Call EMS 911 Now [place], RN, [employer]

## 2014-10-10 NOTE — ED Notes (Signed)
MD at bedside. 

## 2014-10-10 NOTE — ED Provider Notes (Signed)
CSN: [id number]     Arrival date & time 10/10/14  1712 History  This chart was scribed for Louis Johns, MD by Rayfield Citizen, ED Scribe. This patient was seen in room MH09/MH09 and the patient's care was started at 8:06 PM.    Chief Complaint  Patient presents with  . Diarrhea   Patient is a 81 y.o. male presenting with diarrhea. The history is provided by the patient and a relative. No language interpreter was used.  Diarrhea Associated symptoms: no abdominal pain, no arthralgias, no chills, no diaphoresis, no fever, no headaches and no vomiting      HPI Comments: Louis Patel is a 81 y.o. male with past medical history of HTN, HLD, per family, "the beginnings of dementia" who presents to the Emergency Department complaining of diarrhea (brown with some blood, >10x) beginning today around 15:00. Patient's son also reports blood on toilet paper after wiping. Family explains prior episodes occurring several days PTA. He denies vomiting, abdominal pain, cough or cold symptoms, fevers.  Past Medical History  Diagnosis Date  . Hyperlipidemia   . Hypertension    Past Surgical History  Procedure Laterality Date  . Eye surgery  07/18/08    left cataract extraction  . Prostate surgery  3/08    Prostatectomy   Family History  Problem Relation Age of Onset  . Stroke Mother   . Hypertension Mother    History  Substance Use Topics  . Smoking status: Former Smoker -- 1.00 packs/day for 20 years    Types: Cigarettes    Quit date: 08/24/1962  . Smokeless tobacco: Never Used  . Alcohol Use: No    Review of Systems  Constitutional: Negative for fever, chills, diaphoresis and fatigue.  HENT: Negative for congestion, rhinorrhea and sneezing.   Eyes: Negative.   Respiratory: Negative for cough, chest tightness and shortness of breath.   Cardiovascular: Negative for chest pain and leg swelling.  Gastrointestinal: Positive for diarrhea and blood in stool. Negative for nausea, vomiting and  abdominal pain.  Genitourinary: Negative for frequency, hematuria, flank pain and difficulty urinating.  Musculoskeletal: Negative for back pain and arthralgias.  Skin: Negative for rash.  Neurological: Negative for dizziness, speech difficulty, weakness, numbness and headaches.  All other systems reviewed and are negative.   Allergies  Review of patient's allergies indicates no known allergies.  Home Medications   Prior to Admission medications   Medication Sig Start Date End Date Taking? Authorizing Provider  aspirin 81 MG EC tablet Take 81 mg by mouth daily.      Historical Provider, MD  atorvastatin (LIPITOR) 10 MG tablet Take 1 tab by mouth daily 05/10/14   Rosalita Chessman, DO  donepezil (ARICEPT) 10 MG tablet TAKE 1 TABLET BY MOUTH AT BEDTIME AS NEEDED 04/07/14   Rosalita Chessman, DO  finasteride (PROSCAR) 5 MG tablet Take 5 mg by mouth as directed.     Historical Provider, MD  hydrochlorothiazide (HYDRODIURIL) 25 MG tablet TAKE 1 TABLET BY MOUTH EVERY DAY 02/21/14   Rosalita Chessman, DO  levothyroxine (SYNTHROID, LEVOTHROID) 50 MCG tablet TAKE 1 TABLET BY MOUTH EVERY DAY 08/24/14   Alferd Apa Lowne, DO   BP 153/70 mmHg  Pulse 63  Temp(Src) 98 F (36.7 C) (Oral)  Resp 18  Ht 5\' 6"  (1.676 m)  Wt 210 lb (95.255 kg)  BMI 33.91 kg/m2  SpO2 99% Physical Exam  Constitutional: He is oriented to person, place, and time. He appears well-developed and well-nourished.  HENT:  Head: Normocephalic and atraumatic.  Eyes: Pupils are equal, round, and reactive to light.  Neck: Normal range of motion. Neck supple.  Cardiovascular: Normal rate, regular rhythm and normal heart sounds.   Pulmonary/Chest: Effort normal and breath sounds normal. No respiratory distress. He has no wheezes. He has no rales. He exhibits no tenderness.  Abdominal: Soft. Bowel sounds are normal. There is no tenderness. There is no rebound and no guarding.  Genitourinary:  Brown stool mixed with blood   Musculoskeletal:  Normal range of motion. He exhibits no edema.  Lymphadenopathy:    He has no cervical adenopathy.  Neurological: He is alert and oriented to person, place, and time.  Skin: Skin is warm and dry. No rash noted.  Psychiatric: He has a normal mood and affect.    ED Course  Procedures   DIAGNOSTIC STUDIES: Oxygen Saturation is 100% on RA, normal by my interpretation.    COORDINATION OF CARE: 8:14 PM Discussed treatment plan with pt at bedside and pt agreed to plan.  Results for orders placed or performed during the hospital encounter of 10/10/14  Comprehensive metabolic panel  Result Value Ref Range   Sodium 137 135 - 145 mmol/L   Potassium 4.0 3.5 - 5.1 mmol/L   Chloride 102 96 - 112 mmol/L   CO2 28 19 - 32 mmol/L   Glucose, Bld 123 (H) 70 - 99 mg/dL   BUN 27 (H) 6 - 23 mg/dL   Creatinine, Ser 0.96 0.50 - 1.35 mg/dL   Calcium 8.9 8.4 - 10.5 mg/dL   Total Protein 7.0 6.0 - 8.3 g/dL   Albumin 3.9 3.5 - 5.2 g/dL   AST 22 0 - 37 U/L   ALT 26 0 - 53 U/L   Alkaline Phosphatase 73 39 - 117 U/L   Total Bilirubin 0.5 0.3 - 1.2 mg/dL   GFR calc non Af Amer 73 (L) >90 mL/min   GFR calc Af Amer 85 (L) >90 mL/min   Anion gap 7 5 - 15  CBC with Differential  Result Value Ref Range   WBC 12.0 (H) 4.0 - 10.5 K/uL   RBC 5.32 4.22 - 5.81 MIL/uL   Hemoglobin 15.5 13.0 - 17.0 g/dL   HCT 45.5 39.0 - 52.0 %   MCV 85.5 78.0 - 100.0 fL   MCH 29.1 26.0 - 34.0 pg   MCHC 34.1 30.0 - 36.0 g/dL   RDW 14.2 11.5 - 15.5 %   Platelets 194 150 - 400 K/uL   Neutrophils Relative % 80 (H) 43 - 77 %   Neutro Abs 9.8 (H) 1.7 - 7.7 K/uL   Lymphocytes Relative 11 (L) 12 - 46 %   Lymphs Abs 1.3 0.7 - 4.0 K/uL   Monocytes Relative 8 3 - 12 %   Monocytes Absolute 0.9 0.1 - 1.0 K/uL   Eosinophils Relative 1 0 - 5 %   Eosinophils Absolute 0.1 0.0 - 0.7 K/uL   Basophils Relative 0 0 - 1 %   Basophils Absolute 0.0 0.0 - 0.1 K/uL  Occult blood card to lab, stool  Result Value Ref Range   Fecal Occult Bld  POSITIVE (A) NEGATIVE  Urinalysis, Routine w reflex microscopic  Result Value Ref Range   Color, Urine YELLOW YELLOW   APPearance CLOUDY (A) CLEAR   Specific Gravity, Urine 1.021 1.005 - 1.030   pH 6.0 5.0 - 8.0   Glucose, UA NEGATIVE NEGATIVE mg/dL   Hgb urine dipstick LARGE (A) NEGATIVE  Bilirubin Urine NEGATIVE NEGATIVE   Ketones, ur NEGATIVE NEGATIVE mg/dL   Protein, ur NEGATIVE NEGATIVE mg/dL   Urobilinogen, UA 0.2 0.0 - 1.0 mg/dL   Nitrite POSITIVE (A) NEGATIVE   Leukocytes, UA TRACE (A) NEGATIVE  Urine microscopic-add on  Result Value Ref Range   Squamous Epithelial / LPF RARE RARE   WBC, UA 3-6 <3 WBC/hpf   RBC / HPF 11-20 <3 RBC/hpf   Bacteria, UA MANY (A) RARE   Urine-Other AMORPHOUS URATES/PHOSPHATES    Ct Abdomen Pelvis W Contrast  10/10/2014   CLINICAL DATA:  Diarrhea with bloody stool.  EXAM: CT ABDOMEN AND PELVIS WITH CONTRAST  TECHNIQUE: Multidetector CT imaging of the abdomen and pelvis was performed using the standard protocol following bolus administration of intravenous contrast.  CONTRAST:  68mL OMNIPAQUE IOHEXOL 300 MG/ML SOLN, 172mL OMNIPAQUE IOHEXOL 300 MG/ML SOLN  COMPARISON:  None.  FINDINGS: Multilevel degenerative disc disease is noted in lower lumbar spine. Bilateral pars defects are seen at L5. Visualized lung bases appear normal.  No gallstones are noted. Fatty infiltration of the liver is noted. Simple cyst is seen in left hepatic lobe. The spleen and pancreas appear normal. Adrenal glands appear normal. No hydronephrosis or renal obstruction is noted. No renal or ureteral calculi are noted. There is no evidence of bowel obstruction. Atherosclerotic calcifications of abdominal aorta and iliac arteries are noted without aneurysm formation. The appendix appears normal. No abnormal fluid collection is noted. Large amount of stool is noted in the distal sigmoid colon and rectum consistent with impaction. Moderate urinary bladder distention is noted. 1 cm enhancing  nodule is noted inferiorly within the urinary bladder; this may simply represent extension of enlarged prostate gland, but possible neoplasm cannot be excluded. No significant adenopathy is noted. Mild bilateral fat containing inguinal hernias are noted.  IMPRESSION: Moderate urinary bladder distention is noted concerning for bladder outlet obstruction. Mild prostate hypertrophy is noted. There is noted a 1 cm enhancing nodule inferiorly within the urinary bladder which may simply represent extension of enlarged prostate gland, but small neoplasm or malignancy cannot be excluded. Cystoscopy is recommended for further evaluation.  Large amount of stool seen in distal sigmoid colon and rectum concerning for impaction.  Mild bilateral fat containing inguinal hernias.  Fatty infiltration of the liver.   Electronically Signed   By: Marijo Conception, M.D.   On: 10/10/2014 21:45    Labs Review Labs Reviewed  COMPREHENSIVE METABOLIC PANEL - Abnormal; Notable for the following:    Glucose, Bld 123 (*)    BUN 27 (*)    GFR calc non Af Amer 73 (*)    GFR calc Af Amer 85 (*)    All other components within normal limits  CBC WITH DIFFERENTIAL/PLATELET - Abnormal; Notable for the following:    WBC 12.0 (*)    Neutrophils Relative % 80 (*)    Neutro Abs 9.8 (*)    Lymphocytes Relative 11 (*)    All other components within normal limits  OCCULT BLOOD X 1 CARD TO LAB, STOOL - Abnormal; Notable for the following:    Fecal Occult Bld POSITIVE (*)    All other components within normal limits  URINALYSIS, ROUTINE W REFLEX MICROSCOPIC - Abnormal; Notable for the following:    APPearance CLOUDY (*)    Hgb urine dipstick LARGE (*)    Nitrite POSITIVE (*)    Leukocytes, UA TRACE (*)    All other components within normal limits  URINE MICROSCOPIC-ADD ON -  Abnormal; Notable for the following:    Bacteria, UA MANY (*)    All other components within normal limits  POC OCCULT BLOOD, ED    Imaging Review Ct Abdomen  Pelvis W Contrast  10/10/2014   CLINICAL DATA:  Diarrhea with bloody stool.  EXAM: CT ABDOMEN AND PELVIS WITH CONTRAST  TECHNIQUE: Multidetector CT imaging of the abdomen and pelvis was performed using the standard protocol following bolus administration of intravenous contrast.  CONTRAST:  39mL OMNIPAQUE IOHEXOL 300 MG/ML SOLN, 160mL OMNIPAQUE IOHEXOL 300 MG/ML SOLN  COMPARISON:  None.  FINDINGS: Multilevel degenerative disc disease is noted in lower lumbar spine. Bilateral pars defects are seen at L5. Visualized lung bases appear normal.  No gallstones are noted. Fatty infiltration of the liver is noted. Simple cyst is seen in left hepatic lobe. The spleen and pancreas appear normal. Adrenal glands appear normal. No hydronephrosis or renal obstruction is noted. No renal or ureteral calculi are noted. There is no evidence of bowel obstruction. Atherosclerotic calcifications of abdominal aorta and iliac arteries are noted without aneurysm formation. The appendix appears normal. No abnormal fluid collection is noted. Large amount of stool is noted in the distal sigmoid colon and rectum consistent with impaction. Moderate urinary bladder distention is noted. 1 cm enhancing nodule is noted inferiorly within the urinary bladder; this may simply represent extension of enlarged prostate gland, but possible neoplasm cannot be excluded. No significant adenopathy is noted. Mild bilateral fat containing inguinal hernias are noted.  IMPRESSION: Moderate urinary bladder distention is noted concerning for bladder outlet obstruction. Mild prostate hypertrophy is noted. There is noted a 1 cm enhancing nodule inferiorly within the urinary bladder which may simply represent extension of enlarged prostate gland, but small neoplasm or malignancy cannot be excluded. Cystoscopy is recommended for further evaluation.  Large amount of stool seen in distal sigmoid colon and rectum concerning for impaction.  Mild bilateral fat containing  inguinal hernias.  Fatty infiltration of the liver.   Electronically Signed   By: Marijo Conception, M.D.   On: 10/10/2014 21:45     EKG Interpretation None      MDM   Final diagnoses:  None   Pt presents with diarrhea starting today.  CT shows no colitis, but +impaction and +urinary retention.  A foley was placed and about 750cc of urine returned, appears infected, will start abx and leave in foley.  Pt to f/u with his urologist.  Also advised pt's family that the CT shows possible mass vs enlarged prostate in bladder which will need f/u by his urologist.  I attempted a rectal disimpaction, but the stool was too high.  Pt given soap suds enema with large stool.  Pt feeling much better.  No further rectal bleeding, was likely due to irritation from hemorrhoids.  Hct ok.  Advised pt/family to make appt with urology and his PMD for f/u.  Return here if symptoms worsen.  I personally performed the services described in this documentation, which was scribed in my presence.  The recorded information has been reviewed and considered.      Louis Johns, MD 10/11/14 [phone]

## 2014-10-10 NOTE — ED Notes (Signed)
Reports diarrhea x 8 since 1500. Reports blood on tissue paper with wiping. Also reports blood in stool. Denies abdominal pain

## 2014-10-10 NOTE — ED Notes (Signed)
Patient transported to CT 

## 2014-10-11 MED ORDER — CIPROFLOXACIN HCL 500 MG PO TABS
500.0000 mg | ORAL_TABLET | Freq: Once | ORAL | Status: AC
  Administered 2014-10-11: 500 mg via ORAL
  Filled 2014-10-11: qty 1

## 2014-10-11 MED ORDER — CIPROFLOXACIN HCL 500 MG PO TABS: 500.0000 mg | ORAL_TABLET | Freq: Two times a day (BID) | ORAL | Status: DC

## 2014-10-11 NOTE — ED Notes (Signed)
Leg bag placed on pt to go home with

## 2014-10-11 NOTE — Discharge Instructions (Signed)
You have a possible growth in your bladder that needs to be rechecked by your urologist.  Fecal Impaction A fecal impaction happens when there is a large, firm amount of stool (or feces) that cannot be passed. The impacted stool is usually in the rectum, which is the lowest part of the large bowel. The impacted stool can block the colon and cause significant problems. CAUSES  The longer stool stays in the rectum, the harder it gets. Anything that slows down your bowel movements can lead to fecal impaction, such as:  Constipation. This can be a long-standing (chronic) problem or can happen suddenly (acute).  Painful conditions of the rectum, such as hemorrhoids or anal fissures. The pain of these conditions can make you try to avoid having bowel movements.  Narcotic pain-relieving medicines, such as methadone, morphine, or codeine.  Not drinking enough fluids.  Inactivity and bed rest over long periods of time.  Diseases of the brain or nervous system that damage the nerves controlling the muscles of the intestines. SIGNS AND SYMPTOMS   Lack of normal bowel movements or changes in bowel patterns.  Sense of fullness in the rectum but unable to pass stool.  Pain or cramps in the abdominal area (often after meals).  Thin, watery discharge from the rectum. DIAGNOSIS  Your health care provider may suspect that you have a fecal impaction based on your symptoms and a physical exam. This will include an exam of your rectum. Sometimes X-rays or lab testing may be needed to confirm the diagnosis and to be sure there are no other problems.  TREATMENT   Initially an impaction can be removed manually. Using a gloved finger, your health care provider can remove hard stool from your rectum.  Medicine is sometimes needed. A suppository or enema can be given in the rectum to soften the stool, which can stimulate a bowel movement. Medicines can also be given by mouth (orally).  Though rare, surgery  may be needed if the colon has torn (perforated) due to blockage. HOME CARE INSTRUCTIONS   Develop regular bowel habits. This could include getting in the habit of having a bowel movement after your morning cup of coffee or after eating. Be sure to allow yourself enough time on the toilet.  Maintain a high-fiber diet.  Drink enough fluids to keep your urine clear or pale yellow as directed by your health care provider.  Exercise regularly.  If you begin to get constipated, increase the amount of fiber in your diet. Eat plenty of fruits, vegetables, whole wheat breads, bran, oatmeal, and similar products.  Take natural fiber laxatives or other laxatives only as directed by your health care provider. SEEK MEDICAL CARE IF:   You have ongoing rectal pain.  You require enemas or suppositories more than twice a week.  You have rectal bleeding.  You have continued problems, or you develop abdominal pain.  You have thin, pencil-like stools. SEEK IMMEDIATE MEDICAL CARE IF:  You have black or tarry stools. MAKE SURE YOU:   Understand these instructions.  Will watch your condition.  Will get help right away if you are not doing well or get worse. Document Released: 03/16/2004 Document Revised: 04/14/2013 Document Reviewed: 12/29/2012 [facility] Patient Information 2015 [place], [place]. This information is not intended to replace advice given to you by your health care provider. Make sure you discuss any questions you have with your health care provider.  Acute Urinary Retention Acute urinary retention is the temporary inability to urinate. This  is a common problem in older men. As men age their prostates become larger and block the flow of urine from the bladder. This is usually a problem that has come on gradually.  HOME CARE INSTRUCTIONS If you are sent home with a Foley catheter and a drainage system, you will need to discuss the best course of action with your health care provider.  While the catheter is in, maintain a good intake of fluids. Keep the drainage bag emptied and lower than your catheter. This is so that contaminated urine will not flow back into your bladder, which could lead to a urinary tract infection. There are two main types of drainage bags. One is a large bag that usually is used at night. It has a good capacity that will allow you to sleep through the night without having to empty it. The second type is called a leg bag. It has a smaller capacity, so it needs to be emptied more frequently. However, the main advantage is that it can be attached by a leg strap and can go underneath your clothing, allowing you the freedom to move about or leave your home. Only take over-the-counter or prescription medicines for pain, discomfort, or fever as directed by your health care provider.  SEEK MEDICAL CARE IF:  You develop a low-grade fever.  You experience spasms or leakage of urine with the spasms. SEEK IMMEDIATE MEDICAL CARE IF:   You develop chills or fever.  Your catheter stops draining urine.  Your catheter falls out.  You start to develop increased bleeding that does not respond to rest and increased fluid intake. MAKE SURE YOU:  Understand these instructions.  Will watch your condition.  Will get help right away if you are not doing well or get worse. Document Released: 09/30/2000 Document Revised: 06/29/2013 Document Reviewed: 12/03/2012 [facility] Patient Information 2015 [place], [place]. This information is not intended to replace advice given to you by your health care provider. Make sure you discuss any questions you have with your health care provider.

## 2014-10-11 NOTE — Telephone Encounter (Signed)
Patient was evaluated in ED.

## 2014-10-20 ENCOUNTER — Telehealth: Payer: Self-pay | Admitting: Family Medicine

## 2014-10-20 NOTE — Telephone Encounter (Signed)
Pre visit letter sent  °

## 2014-10-24 ENCOUNTER — Ambulatory Visit: Payer: Self-pay | Admitting: Family Medicine

## 2014-11-07 ENCOUNTER — Encounter: Payer: Self-pay | Admitting: *Deleted

## 2014-11-07 ENCOUNTER — Telehealth: Payer: Self-pay | Admitting: *Deleted

## 2014-11-07 NOTE — Telephone Encounter (Signed)
Unable to reach patient at time of Pre-Visit Call.  Left message for patient to return call when available.    

## 2014-11-07 NOTE — Telephone Encounter (Signed)
Pre-Visit Call completed with patient and chart updated.   Pre-Visit Info documented in Specialty Comments under [employer].    

## 2014-11-08 ENCOUNTER — Encounter: Payer: Self-pay | Admitting: Family Medicine

## 2014-11-08 ENCOUNTER — Ambulatory Visit (INDEPENDENT_AMBULATORY_CARE_PROVIDER_SITE_OTHER): Payer: Medicare Other | Admitting: Family Medicine

## 2014-11-08 VITALS — BP 114/76 | HR 68 | Temp 98.8°F | Ht 65.0 in | Wt 199.6 lb

## 2014-11-08 DIAGNOSIS — R195 Other fecal abnormalities: Secondary | ICD-10-CM

## 2014-11-08 DIAGNOSIS — F0280 Dementia in other diseases classified elsewhere without behavioral disturbance: Secondary | ICD-10-CM | POA: Diagnosis not present

## 2014-11-08 DIAGNOSIS — E785 Hyperlipidemia, unspecified: Secondary | ICD-10-CM

## 2014-11-08 DIAGNOSIS — Z0000 Encounter for general adult medical examination without abnormal findings: Secondary | ICD-10-CM | POA: Diagnosis not present

## 2014-11-08 DIAGNOSIS — N400 Enlarged prostate without lower urinary tract symptoms: Secondary | ICD-10-CM

## 2014-11-08 DIAGNOSIS — K5909 Other constipation: Secondary | ICD-10-CM

## 2014-11-08 DIAGNOSIS — K6289 Other specified diseases of anus and rectum: Secondary | ICD-10-CM

## 2014-11-08 DIAGNOSIS — I10 Essential (primary) hypertension: Secondary | ICD-10-CM | POA: Diagnosis not present

## 2014-11-08 DIAGNOSIS — Z7901 Long term (current) use of anticoagulants: Secondary | ICD-10-CM | POA: Diagnosis not present

## 2014-11-08 DIAGNOSIS — E039 Hypothyroidism, unspecified: Secondary | ICD-10-CM

## 2014-11-08 LAB — CBC WITH DIFFERENTIAL/PLATELET
BASOS ABS: 0 10*3/uL (ref 0.0–0.1)
Basophils Relative: 0.3 % (ref 0.0–3.0)
Eosinophils Absolute: 0 10*3/uL (ref 0.0–0.7)
Eosinophils Relative: 0.3 % (ref 0.0–5.0)
HCT: 47.2 % (ref 39.0–52.0)
Hemoglobin: 15.9 g/dL (ref 13.0–17.0)
LYMPHS PCT: 13 % (ref 12.0–46.0)
Lymphs Abs: 1.3 10*3/uL (ref 0.7–4.0)
MCHC: 33.7 g/dL (ref 30.0–36.0)
MCV: 85 fl (ref 78.0–100.0)
MONOS PCT: 6.9 % (ref 3.0–12.0)
Monocytes Absolute: 0.7 10*3/uL (ref 0.1–1.0)
Neutro Abs: 8.1 10*3/uL — ABNORMAL HIGH (ref 1.4–7.7)
Neutrophils Relative %: 79.5 % — ABNORMAL HIGH (ref 43.0–77.0)
PLATELETS: 229 10*3/uL (ref 150.0–400.0)
RBC: 5.55 Mil/uL (ref 4.22–5.81)
RDW: 14.9 % (ref 11.5–15.5)
WBC: 10.2 10*3/uL (ref 4.0–10.5)

## 2014-11-08 LAB — PSA: PSA: 6.92 ng/mL — ABNORMAL HIGH (ref 0.10–4.00)

## 2014-11-08 LAB — LIPID PANEL
CHOL/HDL RATIO: 3
Cholesterol: 139 mg/dL (ref 0–200)
HDL: 47.8 mg/dL (ref >39.00)
LDL Cholesterol: 77 mg/dL (ref 0–99)
NONHDL: 91.2
Triglycerides: 72 mg/dL (ref 0.0–149.0)
VLDL: 14.4 mg/dL (ref 0.0–40.0)

## 2014-11-08 LAB — HEPATIC FUNCTION PANEL
ALT: 26 U/L (ref 0–53)
AST: 19 U/L (ref 0–37)
Albumin: 4 g/dL (ref 3.5–5.2)
Alkaline Phosphatase: 87 U/L (ref 39–117)
BILIRUBIN DIRECT: 0.3 mg/dL (ref 0.0–0.3)
BILIRUBIN TOTAL: 1.2 mg/dL (ref 0.2–1.2)
Total Protein: 7.1 g/dL (ref 6.0–8.3)

## 2014-11-08 LAB — POCT URINALYSIS DIPSTICK

## 2014-11-08 LAB — BASIC METABOLIC PANEL
BUN: 24 mg/dL — AB (ref 6–23)
CALCIUM: 9.6 mg/dL (ref 8.4–10.5)
CO2: 30 mEq/L (ref 19–32)
Chloride: 100 mEq/L (ref 96–112)
Creatinine, Ser: 1.06 mg/dL (ref 0.40–1.50)
GFR: 70.3 mL/min (ref >60.00)
GLUCOSE: 115 mg/dL — AB (ref 70–99)
Potassium: 3.4 mEq/L — ABNORMAL LOW (ref 3.5–5.1)
Sodium: 138 mEq/L (ref 135–145)

## 2014-11-08 LAB — TSH: TSH: 3.33 u[IU]/mL (ref 0.35–4.50)

## 2014-11-08 MED ORDER — ZINC OXIDE 16 % EX OINT: TOPICAL_OINTMENT | CUTANEOUS | Status: DC

## 2014-11-08 NOTE — Progress Notes (Signed)
Subjective:    Louis Patel is a 81 y.o. male who presents for Medicare Annual/Subsequent preventive examination.   Preventive Screening-Counseling & Management  Tobacco History  Smoking status  . Former Smoker -- 1.00 packs/day for 20 years  . Types: Cigarettes  . Quit date: 08/24/1962  Smokeless tobacco  . Never Used    Problems Prior to Visit 1. Constipation and rectal bleed-- er ov reviewed-- pt also saw urology after er and cystoscopy will be done at next visit  Current Problems (verified) Patient Active Problem List   Diagnosis Date Noted  . Obesity (BMI 30-39.9) 02/22/2013  . Memory loss 01/22/2011  . MOLE 05/22/2010  . DEPRESSIVE DISORDER 05/22/2010  . ALLERGIC RHINITIS CAUSE UNSPECIFIED 10/17/2008  . PSA, INCREASED 05/23/2008  . HYPERLIPIDEMIA 02/26/2007  . HYPERTENSION 02/26/2007  . HEMORRHOIDS, EXTERNAL 02/26/2007    Medications Prior to Visit Current Outpatient Prescriptions on File Prior to Visit  Medication Sig Dispense Refill  . aspirin 81 MG EC tablet Take 81 mg by mouth daily.      Marland Kitchen atorvastatin (LIPITOR) 10 MG tablet Take 1 tab by mouth daily 90 tablet 1  . donepezil (ARICEPT) 10 MG tablet TAKE 1 TABLET BY MOUTH AT BEDTIME AS NEEDED 90 tablet 1  . finasteride (PROSCAR) 5 MG tablet Take 5 mg by mouth as directed.     . hydrochlorothiazide (HYDRODIURIL) 25 MG tablet TAKE 1 TABLET BY MOUTH EVERY DAY 90 tablet 1  . levothyroxine (SYNTHROID, LEVOTHROID) 50 MCG tablet TAKE 1 TABLET BY MOUTH EVERY DAY 90 tablet 3   No current facility-administered medications on file prior to visit.    Current Medications (verified) Current Outpatient Prescriptions  Medication Sig Dispense Refill  . aspirin 81 MG EC tablet Take 81 mg by mouth daily.      Marland Kitchen atorvastatin (LIPITOR) 10 MG tablet Take 1 tab by mouth daily 90 tablet 1  . COLLAGEN PO Take 1 tablet by mouth daily.    Marland Kitchen donepezil (ARICEPT) 10 MG tablet TAKE 1 TABLET BY MOUTH AT BEDTIME AS NEEDED 90 tablet 1   . finasteride (PROSCAR) 5 MG tablet Take 5 mg by mouth as directed.     . hydrochlorothiazide (HYDRODIURIL) 25 MG tablet TAKE 1 TABLET BY MOUTH EVERY DAY 90 tablet 1  . levothyroxine (SYNTHROID, LEVOTHROID) 50 MCG tablet TAKE 1 TABLET BY MOUTH EVERY DAY 90 tablet 3  . tamsulosin (FLOMAX) 0.4 MG CAPS capsule Take 1 capsule by mouth daily.  0   No current facility-administered medications for this visit.     Allergies (verified) Review of patient's allergies indicates no known allergies.   PAST HISTORY  Family History Family History  Problem Relation Age of Onset  . Stroke Mother   . Hypertension Mother     Social History History  Substance Use Topics  . Smoking status: Former Smoker -- 1.00 packs/day for 20 years    Types: Cigarettes    Quit date: 08/24/1962  . Smokeless tobacco: Never Used  . Alcohol Use: No    Are there smokers in your home (other than you)?  No  Risk Factors Current exercise habits: tennis   Dietary issues discussed: pt was not eating but now that he is in assisted living they feel it will get better.  Cardiac risk factors: advanced age (older than 40 for men, 24 for women), dyslipidemia, hypertension and male gender.  Depression Screen (Note: if answer to either of the following is "Yes", a more complete depression screening is  indicated)   Q1: Over the past two weeks, have you felt down, depressed or hopeless? Yes  Q2: Over the past two weeks, have you felt little interest or pleasure in doing things? Yes  Have you lost interest or pleasure in daily life? Yes  Do you often feel hopeless? Yes  Do you cry easily over simple problems? No  Activities of Daily Living In your present state of health, do you have any difficulty performing the following activities?:  Driving? No Managing money?  No Feeding yourself? Yes Getting from bed to chair? No Climbing a flight of stairs? No Preparing food and eating?: Yes Bathing or showering? No Getting  dressed: No Getting to the toilet? No Using the toilet:No Moving around from place to place: No In the past year have you fallen or had a near fall?:No   Are you sexually active?  No  Do you have more than one partner?  No  Hearing Difficulties: Yes--- they don't want anything done now Do you often ask people to speak up or repeat themselves? Yes Do you experience ringing or noises in your ears? No Do you have difficulty understanding soft or whispered voices? Yes   Do you feel that you have a problem with memory? Yes  Do you often misplace items? Yes  Do you feel safe at home?  Yes  Cognitive Testing  Alert? Yes  Normal Appearance?Yes  Oriented to person? Yes  Place? Yes   Time? Yes  Recall of three objects?  Yes  Can perform simple calculations? Yes  Displays appropriate judgment?Yes  Can read the correct time from a watch face?Yes   Advanced Directives have been discussed with the patient? Yes   List the Names of Other Physician/Practitioners you currently use: 1.  See snap shot  Indicate any recent Medical Services you may have received from other than [facility] providers in the past year (date may be approximate).  Immunization History  Administered Date(s) Administered  . Influenza Split 04/25/2011, 04/30/2012  . Influenza Whole 05/14/2005, 05/06/2007, 05/04/2008, 04/24/2009, 05/22/2010  . Influenza, High Dose Seasonal PF 05/10/2014  . Pneumococcal Conjugate-13 05/10/2014  . Pneumococcal Polysaccharide-23 04/24/1999  . Tdap 03/14/2011  . Zoster 12/27/2009    Screening Tests Health Maintenance  Topic Date Due  . INFLUENZA VACCINE  02/06/2015  . COLONOSCOPY  12/30/2018  . TETANUS/TDAP  03/13/2021  . ZOSTAVAX  Completed  . PNA vac Low Risk Adult  Completed    All answers were reviewed with the patient and necessary referrals were made:  Garnet Koyanagi, DO   11/08/2014   History reviewed:  He  has a past medical history of Hyperlipidemia and Hypertension. He   does not have any pertinent problems on file. He  has past surgical history that includes Eye surgery (07/18/08) and Prostate surgery (3/08). His family history includes Hypertension in his mother; Stroke in his mother. He  reports that he quit smoking about 52 years ago. His smoking use included Cigarettes. He has a 20 pack-year smoking history. He has never used smokeless tobacco. He reports that he does not drink alcohol or use illicit drugs. He has a current medication list which includes the following prescription(s): aspirin, atorvastatin, collagen, donepezil, finasteride, hydrochlorothiazide, levothyroxine, and tamsulosin. Current Outpatient Prescriptions on File Prior to Visit  Medication Sig Dispense Refill  . aspirin 81 MG EC tablet Take 81 mg by mouth daily.      Marland Kitchen atorvastatin (LIPITOR) 10 MG tablet Take 1 tab by mouth daily  90 tablet 1  . donepezil (ARICEPT) 10 MG tablet TAKE 1 TABLET BY MOUTH AT BEDTIME AS NEEDED 90 tablet 1  . finasteride (PROSCAR) 5 MG tablet Take 5 mg by mouth as directed.     . hydrochlorothiazide (HYDRODIURIL) 25 MG tablet TAKE 1 TABLET BY MOUTH EVERY DAY 90 tablet 1  . levothyroxine (SYNTHROID, LEVOTHROID) 50 MCG tablet TAKE 1 TABLET BY MOUTH EVERY DAY 90 tablet 3   No current facility-administered medications on file prior to visit.   He has No Known Allergies.  Review of Systems   Review of Systems  Constitutional: Negative for activity change, appetite change and fatigue.  HENT: Negative for hearing loss, congestion, tinnitus and ear discharge.   Eyes: Negative for visual disturbance (see optho q1y -- vision corrected to 20/20 with glasses).  Respiratory: Negative for cough, chest tightness and shortness of breath.   Cardiovascular: Negative for chest pain, palpitations and leg swelling.  Gastrointestinal: Negative for abdominal pain, diarrhea, constipation and abdominal distention.  Genitourinary: Negative for urgency, frequency, decreased urine  volume and difficulty urinating.  Musculoskeletal: Negative for back pain, arthralgias and gait problem.  Skin: Negative for color change, pallor and rash.  Neurological: Negative for dizziness, light-headedness, numbness and headaches.  Hematological: Negative for adenopathy. Does not bruise/bleed easily.  Psychiatric/Behavioral: Negative for suicidal ideas, confusion, sleep disturbance, self-injury, dysphoric mood, decreased concentration and agitation.  Pt is able to read and write and can do all ADLs No risk for falling No abuse/ violence in home    Objective:     Vision by Snellen chart: opth ,Blood pressure 114/76, pulse 68, temperature 98.8 F (37.1 C), temperature source Oral, height 5\' 5"  (1.651 m), weight 199 lb 9.6 oz (90.538 kg), SpO2 98 %. Body mass index is 33.22 kg/(m^2).  BP 114/76 mmHg  Pulse 68  Temp(Src) 98.8 F (37.1 C) (Oral)  Ht 5\' 5"  (1.651 m)  Wt 199 lb 9.6 oz (90.538 kg)  BMI 33.22 kg/m2  SpO2 98% General appearance: alert, cooperative, appears stated age and no distress Head: Normocephalic, without obvious abnormality, atraumatic Eyes: negative findings: lids and lashes normal, conjunctivae and sclerae normal and pupils equal, round, reactive to light and accomodation Ears: normal TM's and external ear canals both ears Nose: Nares normal. Septum midline. Mucosa normal. No drainage or sinus tenderness. Throat: lips, mucosa, and tongue normal; teeth and gums normal Neck: no adenopathy, supple, symmetrical, trachea midline and thyroid not enlarged, symmetric, no tenderness/mass/nodules Back: symmetric, no curvature. ROM normal. No CVA tenderness. Lungs: clear to auscultation bilaterally Chest wall: no tenderness Heart: S1, S2 normal Abdomen: soft, non-tender; bowel sounds normal; no masses,  no organomegaly Male genitalia: normal Rectal: heme + stool,  Ext rectum +errythma, painful Extremities: extremities normal, atraumatic, no cyanosis or  edema Pulses: 2+ and symmetric Skin: Skin color, texture, turgor normal. No rashes or lesions Lymph nodes: Cervical, supraclavicular, and axillary nodes normal. Neurologic: Alert and oriented X 3, normal strength and tone. Normal symmetric reflexes. Normal coordination and gait Psych-  No depression, no anxiety      Assessment:     cpe      Plan:     During the course of the visit the patient was educated and counseled about appropriate screening and preventive services including:    Pneumococcal vaccine   Influenza vaccine  Hepatitis B vaccine  Td vaccine  Prostate cancer screening  Diabetes screening  Glaucoma screening  Nutrition counseling   Advanced directives: has an advanced directive -  a copy HAS NOT been provided.  Diet review for nutrition referral? Yes __x__  Not Indicated ____pt is in assisted living now-- family believes things will get better   Patient Instructions (the written plan) was given to the patient.  Medicare Attestation I have personally reviewed: The patient's medical and social history Their use of alcohol, tobacco or illicit drugs Their current medications and supplements The patient's functional ability including ADLs,fall risks, home safety risks, cognitive, and hearing and visual impairment Diet and physical activities Evidence for depression or mood disorders  The patient's weight, height, BMI, and visual acuity have been recorded in the chart.  I have made referrals, counseling, and provided education to the patient based on review of the above and I have provided the patient with a written personalized care plan for preventive services.    1. 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 Hyperlipidemia Check labs, con't lipitor - Lipid panel - Hepatic function panel  3. Essential hypertension Stable, con't hct - POCT urinalysis  dipstick - Hepatic function panel - CBC with Differential/Platelet - Basic metabolic panel  4. Hypothyroidism, unspecified hypothyroidism type Check labs-- con't synthroid - TSH  5. BPH (benign prostatic hypertrophy) F/u urology - PSA  6. Heme positive stool Refer to GI - Ambulatory referral to Gastroenterology  7. Other constipation Miralax, fluids fiber,  Recheck tsh - Ambulatory referral to Gastroenterology  8. Routine history and physical examination of adult   . Garnet Koyanagi, DO   11/08/2014

## 2014-11-08 NOTE — Progress Notes (Signed)
Pre visit review using our clinic review tool, if applicable. No additional management support is needed unless otherwise documented below in the visit note. 

## 2014-11-08 NOTE — Patient Instructions (Signed)
Preventive Care for Adults A healthy lifestyle and preventive care can promote health and wellness. Preventive health guidelines for men include the following key practices:  A routine yearly physical is a good way to check with your health care provider about your health and preventative screening. It is a chance to share any concerns and updates on your health and to receive a thorough exam.  Visit your dentist for a routine exam and preventative care every 6 months. Brush your teeth twice a day and floss once a day. Good oral hygiene prevents tooth decay and gum disease.  The frequency of eye exams is based on your age, health, family medical history, use of contact lenses, and other factors. Follow your health care provider's recommendations for frequency of eye exams.  Eat a healthy diet. Foods such as vegetables, fruits, whole grains, low-fat dairy products, and lean protein foods contain the nutrients you need without too many calories. Decrease your intake of foods high in solid fats, added sugars, and salt. Eat the right amount of calories for you.Get information about a proper diet from your health care provider, if necessary.  Regular physical exercise is one of the most important things you can do for your health. Most adults should get at least 150 minutes of moderate-intensity exercise (any activity that increases your heart rate and causes you to sweat) each week. In addition, most adults need muscle-strengthening exercises on 2 or more days a week.  Maintain a healthy weight. The body mass index (BMI) is a screening tool to identify possible weight problems. It provides an estimate of body fat based on height and weight. Your health care provider can find your BMI and can help you achieve or maintain a healthy weight.For adults 20 years and older:  A BMI below 18.5 is considered underweight.  A BMI of 18.5 to 24.9 is normal.  A BMI of 25 to 29.9 is considered overweight.  A BMI  of 30 and above is considered obese.  Maintain normal blood lipids and cholesterol levels by exercising and minimizing your intake of saturated fat. Eat a balanced diet with plenty of fruit and vegetables. Blood tests for lipids and cholesterol should begin at age 50 and be repeated every 5 years. If your lipid or cholesterol levels are high, you are over 50, or you are at high risk for heart disease, you may need your cholesterol levels checked more frequently.Ongoing high lipid and cholesterol levels should be treated with medicines if diet and exercise are not working.  If you smoke, find out from your health care provider how to quit. If you do not use tobacco, do not start.  Lung cancer screening is recommended for adults aged 73-80 years who are at high risk for developing lung cancer because of a history of smoking. A yearly low-dose CT scan of the lungs is recommended for people who have at least a 30-pack-year history of smoking and are a current smoker or have quit within the past 15 years. A pack year of smoking is smoking an average of 1 pack of cigarettes a day for 1 year (for example: 1 pack a day for 30 years or 2 packs a day for 15 years). Yearly screening should continue until the smoker has stopped smoking for at least 15 years. Yearly screening should be stopped for people who develop a health problem that would prevent them from having lung cancer treatment.  If you choose to drink alcohol, do not have more than  2 drinks per day. One drink is considered to be 12 ounces (355 mL) of beer, 5 ounces (148 mL) of wine, or 1.5 ounces (44 mL) of liquor.  Avoid use of street drugs. Do not share needles with anyone. Ask for help if you need support or instructions about stopping the use of drugs.  High blood pressure causes heart disease and increases the risk of stroke. Your blood pressure should be checked at least every 1-2 years. Ongoing high blood pressure should be treated with  medicines, if weight loss and exercise are not effective.  If you are 45-79 years old, ask your health care provider if you should take aspirin to prevent heart disease.  Diabetes screening involves taking a blood sample to check your fasting blood sugar level. This should be done once every 3 years, after age 45, if you are within normal weight and without risk factors for diabetes. Testing should be considered at a younger age or be carried out more frequently if you are overweight and have at least 1 risk factor for diabetes.  Colorectal cancer can be detected and often prevented. Most routine colorectal cancer screening begins at the age of 50 and continues through age 75. However, your health care provider may recommend screening at an earlier age if you have risk factors for colon cancer. On a yearly basis, your health care provider may provide home test kits to check for hidden blood in the stool. Use of a small camera at the end of a tube to directly examine the colon (sigmoidoscopy or colonoscopy) can detect the earliest forms of colorectal cancer. Talk to your health care provider about this at age 50, when routine screening begins. Direct exam of the colon should be repeated every 5-10 years through age 75, unless early forms of precancerous polyps or small growths are found.  People who are at an increased risk for hepatitis B should be screened for this virus. You are considered at high risk for hepatitis B if:  You were born in a country where hepatitis B occurs often. Talk with your health care provider about which countries are considered high risk.  Your parents were born in a high-risk country and you have not received a shot to protect against hepatitis B (hepatitis B vaccine).  You have HIV or AIDS.  You use needles to inject street drugs.  You live with, or have sex with, someone who has hepatitis B.  You are a man who has sex with other men (MSM).  You get hemodialysis  treatment.  You take certain medicines for conditions such as cancer, organ transplantation, and autoimmune conditions.  Hepatitis C blood testing is recommended for all people born from 1945 through 1965 and any individual with known risks for hepatitis C.  Practice safe sex. Use condoms and avoid high-risk sexual practices to reduce the spread of sexually transmitted infections (STIs). STIs include gonorrhea, chlamydia, syphilis, trichomonas, herpes, HPV, and human immunodeficiency virus (HIV). Herpes, HIV, and HPV are viral illnesses that have no cure. They can result in disability, cancer, and death.  If you are at risk of being infected with HIV, it is recommended that you take a prescription medicine daily to prevent HIV infection. This is called preexposure prophylaxis (PrEP). You are considered at risk if:  You are a man who has sex with other men (MSM) and have other risk factors.  You are a heterosexual man, are sexually active, and are at increased risk for HIV infection.    You take drugs by injection.  You are sexually active with a partner who has HIV.  Talk with your health care provider about whether you are at high risk of being infected with HIV. If you choose to begin PrEP, you should first be tested for HIV. You should then be tested every 3 months for as long as you are taking PrEP.  A one-time screening for abdominal aortic aneurysm (AAA) and surgical repair of large AAAs by ultrasound are recommended for men ages 32 to 67 years who are current or former smokers.  Healthy men should no longer receive prostate-specific antigen (PSA) blood tests as part of routine cancer screening. Talk with your health care provider about prostate cancer screening.  Testicular cancer screening is not recommended for adult males who have no symptoms. Screening includes self-exam, a health care provider exam, and other screening tests. Consult with your health care provider about any symptoms  you have or any concerns you have about testicular cancer.  Use sunscreen. Apply sunscreen liberally and repeatedly throughout the day. You should seek shade when your shadow is shorter than you. Protect yourself by wearing long sleeves, pants, a wide-brimmed hat, and sunglasses year round, whenever you are outdoors.  Once a month, do a whole-body skin exam, using a mirror to look at the skin on your back. Tell your health care provider about new moles, moles that have irregular borders, moles that are larger than a pencil eraser, or moles that have changed in shape or color.  Stay current with required vaccines (immunizations).  Influenza vaccine. All adults should be immunized every year.  Tetanus, diphtheria, and acellular pertussis (Td, Tdap) vaccine. An adult who has not previously received Tdap or who does not know his vaccine status should receive 1 dose of Tdap. This initial dose should be followed by tetanus and diphtheria toxoids (Td) booster doses every 10 years. Adults with an unknown or incomplete history of completing a 3-dose immunization series with Td-containing vaccines should begin or complete a primary immunization series including a Tdap dose. Adults should receive a Td booster every 10 years.  Varicella vaccine. An adult without evidence of immunity to varicella should receive 2 doses or a second dose if he has previously received 1 dose.  Human papillomavirus (HPV) vaccine. Males aged 68-21 years who have not received the vaccine previously should receive the 3-dose series. Males aged 22-26 years may be immunized. Immunization is recommended through the age of 6 years for any male who has sex with males and did not get any or all doses earlier. Immunization is recommended for any person with an immunocompromised condition through the age of 49 years if he did not get any or all doses earlier. During the 3-dose series, the second dose should be obtained 4-8 weeks after the first  dose. The third dose should be obtained 24 weeks after the first dose and 16 weeks after the second dose.  Zoster vaccine. One dose is recommended for adults aged 50 years or older unless certain conditions are present.  Measles, mumps, and rubella (MMR) vaccine. Adults born before [birth year] generally are considered immune to measles and mumps. Adults born in [birth year] or later should have 1 or more doses of MMR vaccine unless there is a contraindication to the vaccine or there is laboratory evidence of immunity to each of the three diseases. A routine second dose of MMR vaccine should be obtained at least 28 days after the first dose for students attending postsecondary  schools, health care workers, or international travelers. People who received inactivated measles vaccine or an unknown type of measles vaccine during 1963-1967 should receive 2 doses of MMR vaccine. People who received inactivated mumps vaccine or an unknown type of mumps vaccine before 1979 and are at high risk for mumps infection should consider immunization with 2 doses of MMR vaccine. Unvaccinated health care workers born before 1957 who lack laboratory evidence of measles, mumps, or rubella immunity or laboratory confirmation of disease should consider measles and mumps immunization with 2 doses of MMR vaccine or rubella immunization with 1 dose of MMR vaccine.  Pneumococcal 13-valent conjugate (PCV13) vaccine. When indicated, a person who is uncertain of his immunization history and has no record of immunization should receive the PCV13 vaccine. An adult aged 19 years or older who has certain medical conditions and has not been previously immunized should receive 1 dose of PCV13 vaccine. This PCV13 should be followed with a dose of pneumococcal polysaccharide (PPSV23) vaccine. The PPSV23 vaccine dose should be obtained at least 8 weeks after the dose of PCV13 vaccine. An adult aged 19 years or older who has certain medical conditions and  previously received 1 or more doses of PPSV23 vaccine should receive 1 dose of PCV13. The PCV13 vaccine dose should be obtained 1 or more years after the last PPSV23 vaccine dose.  Pneumococcal polysaccharide (PPSV23) vaccine. When PCV13 is also indicated, PCV13 should be obtained first. All adults aged 65 years and older should be immunized. An adult younger than age 65 years who has certain medical conditions should be immunized. Any person who resides in a nursing home or long-term care facility should be immunized. An adult smoker should be immunized. People with an immunocompromised condition and certain other conditions should receive both PCV13 and PPSV23 vaccines. People with human immunodeficiency virus (HIV) infection should be immunized as soon as possible after diagnosis. Immunization during chemotherapy or radiation therapy should be avoided. Routine use of PPSV23 vaccine is not recommended for American Indians, [place] Natives, or people younger than 65 years unless there are medical conditions that require PPSV23 vaccine. When indicated, people who have unknown immunization and have no record of immunization should receive PPSV23 vaccine. One-time revaccination 5 years after the first dose of PPSV23 is recommended for people aged 19-64 years who have chronic kidney failure, nephrotic syndrome, asplenia, or immunocompromised conditions. People who received 1-2 doses of PPSV23 before age 65 years should receive another dose of PPSV23 vaccine at age 65 years or later if at least 5 years have passed since the previous dose. Doses of PPSV23 are not needed for people immunized with PPSV23 at or after age 65 years.  Meningococcal vaccine. Adults with asplenia or persistent complement component deficiencies should receive 2 doses of quadrivalent meningococcal conjugate (MenACWY-D) vaccine. The doses should be obtained at least 2 months apart. Microbiologists working with certain meningococcal bacteria,  military recruits, people at risk during an outbreak, and people who travel to or live in countries with a high rate of meningitis should be immunized. A first-year college student up through age 21 years who is living in a residence hall should receive a dose if he did not receive a dose on or after his 16th birthday. Adults who have certain high-risk conditions should receive one or more doses of vaccine.  Hepatitis A vaccine. Adults who wish to be protected from this disease, have certain high-risk conditions, work with hepatitis A-infected animals, work in hepatitis A research labs, or   travel to or work in countries with a high rate of hepatitis A should be immunized. Adults who were previously unvaccinated and who anticipate close contact with an international adoptee during the first 60 days after arrival in the [place] from a country with a high rate of hepatitis A should be immunized.  Hepatitis B vaccine. Adults should be immunized if they wish to be protected from this disease, have certain high-risk conditions, may be exposed to blood or other infectious body fluids, are household contacts or sex partners of hepatitis B positive people, are clients or workers in certain care facilities, or travel to or work in countries with a high rate of hepatitis B.  Haemophilus influenzae type b (Hib) vaccine. A previously unvaccinated person with asplenia or sickle cell disease or having a scheduled splenectomy should receive 1 dose of Hib vaccine. Regardless of previous immunization, a recipient of a hematopoietic stem cell transplant should receive a 3-dose series 6-12 months after his successful transplant. Hib vaccine is not recommended for adults with HIV infection. Preventive Service / Frequency Ages 52 to 17  Blood pressure check.** / Every 1 to 2 years.  Lipid and cholesterol check.** / Every 5 years beginning at age 69.  Hepatitis C blood test.** / For any individual with known risks for  hepatitis C.  Skin self-exam. / Monthly.  Influenza vaccine. / Every year.  Tetanus, diphtheria, and acellular pertussis (Tdap, Td) vaccine.** / Consult your health care provider. 1 dose of Td every 10 years.  Varicella vaccine.** / Consult your health care provider.  HPV vaccine. / 3 doses over 6 months, if 72 or younger.  Measles, mumps, rubella (MMR) vaccine.** / You need at least 1 dose of MMR if you were born in 1957 or later. You may also need a second dose.  Pneumococcal 13-valent conjugate (PCV13) vaccine.** / Consult your health care provider.  Pneumococcal polysaccharide (PPSV23) vaccine.** / 1 to 2 doses if you smoke cigarettes or if you have certain conditions.  Meningococcal vaccine.** / 1 dose if you are age 35 to 60 years and a Market researcher living in a residence hall, or have one of several medical conditions. You may also need additional booster doses.  Hepatitis A vaccine.** / Consult your health care provider.  Hepatitis B vaccine.** / Consult your health care provider.  Haemophilus influenzae type b (Hib) vaccine.** / Consult your health care provider. Ages 35 to 8  Blood pressure check.** / Every 1 to 2 years.  Lipid and cholesterol check.** / Every 5 years beginning at age 57.  Lung cancer screening. / Every year if you are aged 44-80 years and have a 30-pack-year history of smoking and currently smoke or have quit within the past 15 years. Yearly screening is stopped once you have quit smoking for at least 15 years or develop a health problem that would prevent you from having lung cancer treatment.  Fecal occult blood test (FOBT) of stool. / Every year beginning at age 55 and continuing until age 73. You may not have to do this test if you get a colonoscopy every 10 years.  Flexible sigmoidoscopy** or colonoscopy.** / Every 5 years for a flexible sigmoidoscopy or every 10 years for a colonoscopy beginning at age 28 and continuing until age  1.  Hepatitis C blood test.** / For all people born from [birth year] through 1965 and any individual with known risks for hepatitis C.  Skin self-exam. / Monthly.  Influenza vaccine. / Every  year.  Tetanus, diphtheria, and acellular pertussis (Tdap/Td) vaccine.** / Consult your health care provider. 1 dose of Td every 10 years.  Varicella vaccine.** / Consult your health care provider.  Zoster vaccine.** / 1 dose for adults aged 53 years or older.  Measles, mumps, rubella (MMR) vaccine.** / You need at least 1 dose of MMR if you were born in 1957 or later. You may also need a second dose.  Pneumococcal 13-valent conjugate (PCV13) vaccine.** / Consult your health care provider.  Pneumococcal polysaccharide (PPSV23) vaccine.** / 1 to 2 doses if you smoke cigarettes or if you have certain conditions.  Meningococcal vaccine.** / Consult your health care provider.  Hepatitis A vaccine.** / Consult your health care provider.  Hepatitis B vaccine.** / Consult your health care provider.  Haemophilus influenzae type b (Hib) vaccine.** / Consult your health care provider. Ages 77 and over  Blood pressure check.** / Every 1 to 2 years.  Lipid and cholesterol check.**/ Every 5 years beginning at age 85.  Lung cancer screening. / Every year if you are aged 55-80 years and have a 30-pack-year history of smoking and currently smoke or have quit within the past 15 years. Yearly screening is stopped once you have quit smoking for at least 15 years or develop a health problem that would prevent you from having lung cancer treatment.  Fecal occult blood test (FOBT) of stool. / Every year beginning at age 33 and continuing until age 11. You may not have to do this test if you get a colonoscopy every 10 years.  Flexible sigmoidoscopy** or colonoscopy.** / Every 5 years for a flexible sigmoidoscopy or every 10 years for a colonoscopy beginning at age 28 and continuing until age 73.  Hepatitis C blood  test.** / For all people born from [birth year] through 1965 and any individual with known risks for hepatitis C.  Abdominal aortic aneurysm (AAA) screening.** / A one-time screening for ages 50 to 27 years who are current or former smokers.  Skin self-exam. / Monthly.  Influenza vaccine. / Every year.  Tetanus, diphtheria, and acellular pertussis (Tdap/Td) vaccine.** / 1 dose of Td every 10 years.  Varicella vaccine.** / Consult your health care provider.  Zoster vaccine.** / 1 dose for adults aged 34 years or older.  Pneumococcal 13-valent conjugate (PCV13) vaccine.** / Consult your health care provider.  Pneumococcal polysaccharide (PPSV23) vaccine.** / 1 dose for all adults aged 63 years and older.  Meningococcal vaccine.** / Consult your health care provider.  Hepatitis A vaccine.** / Consult your health care provider.  Hepatitis B vaccine.** / Consult your health care provider.  Haemophilus influenzae type b (Hib) vaccine.** / Consult your health care provider. **Family history and personal history of risk and conditions may change your health care provider's recommendations. Document Released: 08/20/2001 Document Revised: 06/29/2013 Document Reviewed: 11/19/2010 [facility] Patient Information 2015 [place], [place]. This information is not intended to replace advice given to you by your health care provider. Make sure you discuss any questions you have with your health care provider.

## 2014-11-10 ENCOUNTER — Telehealth: Payer: Self-pay | Admitting: Internal Medicine

## 2014-11-10 NOTE — Telephone Encounter (Signed)
Left message for patient/Roseanne  to call back

## 2014-11-10 NOTE — Telephone Encounter (Signed)
Patient is scheduled with the patient's daughter for 11/16/14 with Cecille Rubin Hvozdovic, PA

## 2014-11-11 ENCOUNTER — Other Ambulatory Visit: Payer: Self-pay | Admitting: Family Medicine

## 2014-11-16 ENCOUNTER — Ambulatory Visit (INDEPENDENT_AMBULATORY_CARE_PROVIDER_SITE_OTHER): Payer: Medicare Other | Admitting: Physician Assistant

## 2014-11-16 ENCOUNTER — Encounter: Payer: Self-pay | Admitting: Physician Assistant

## 2014-11-16 VITALS — BP 124/76 | HR 76 | Ht 63.5 in | Wt 198.2 lb

## 2014-11-16 DIAGNOSIS — Z860100 Personal history of colon polyps, unspecified: Secondary | ICD-10-CM

## 2014-11-16 DIAGNOSIS — K5900 Constipation, unspecified: Secondary | ICD-10-CM

## 2014-11-16 MED ORDER — GLYCERIN (LAXATIVE) 2 G RE SUPP
1.0000 | Freq: Once | RECTAL | Status: DC
Start: 2014-11-16 — End: 2015-06-19

## 2014-11-16 NOTE — Progress Notes (Addendum)
Patient ID: Louis Patel, male   DOB: [birth year], 81 y.o.   MRN: [id number]    HPI:  Louis Patel is a 81 y.o.   male referred by Rosalita Chessman, DO for evaluation of constipation and heme positive stools.    [name] resides at the [facility] assisted living facility in [place]. He is followed medically by Dr. Etter Sjogren at the [facility].  Was recently seen in the emergency room there for loose stools. He had gone several days without a bowel movement and was at the time having loose stools in the emergency room he was found to have a fecal impaction and heme positive stools. He reports that he often skips several days between bowel movements. On the day before he went to the emergency room and the actual day that he went to the emergency room he was having crampy lower abdominal pain with no fever or chills. He began to have some blood with his loose stools and his stool test in the ER was heme positive. He was disimpacted in the emergency room and this relieved his abdominal pain and he has not had any blood in his stool since. His last colonoscopy was done in June 2010 vital Dr. Teena Irani of [facility] gastroenterology. He had 3426 mm polyps in the transverse colon as well as diverticulosis in the sigmoid and the descending colon. Pathology reveals transverse colon polyps to be adenomatous and hyperplastic. He was advised to have surveillance in 3-5 years. His appetite has been good and his weight has been stable. He has no nausea or vomiting. He has been using Colace and with this he is having a bowel movement every other day. His daughter who accompanies him reports that he drinks very little fluid and they have been trying to get him to drink more fluids with his meals which seems to have been helping his bowel movements.   Past Medical History  Diagnosis Date  . Hyperlipidemia   . Hypertension   . Dementia   . Enlarged prostate   . Hypothyroidism   . Diverticulosis   . Colon  polyps     Past Surgical History  Procedure Laterality Date  . Cataract extraction Left 07/18/08    left cataract extraction  . Prostate surgery  3/08    Prostatectomy  . Cataract extraction Right   . Tonsillectomy    . Colonoscopy     Family History  Problem Relation Age of Onset  . Stroke Mother   . Hypertension Mother   . Esophageal cancer Brother   . Heart disease     History  Substance Use Topics  . Smoking status: Former Smoker -- 1.00 packs/day for 20 years    Types: Cigarettes    Quit date: 08/24/1962  . Smokeless tobacco: Never Used  . Alcohol Use: 0.0 oz/week    0 Standard drinks or equivalent per week     Comment: one per day   Current Outpatient Prescriptions  Medication Sig Dispense Refill  . aspirin 81 MG EC tablet Take 81 mg by mouth daily.      Marland Kitchen atorvastatin (LIPITOR) 10 MG tablet Take 1 tab by mouth daily 90 tablet 1  . donepezil (ARICEPT) 10 MG tablet TAKE 1 TABLET BY MOUTH AT BEDTIME AS NEEDED 90 tablet 1  . finasteride (PROSCAR) 5 MG tablet Take 5 mg by mouth as directed.     Marland Kitchen glycerin adult (GLYCERIN ADULT) 2 G SUPP Place 1 suppository rectally  once. 30 each 3  . hydrochlorothiazide (HYDRODIURIL) 25 MG tablet TAKE 1 TABLET BY MOUTH EVERY DAY 90 tablet 1  . levothyroxine (SYNTHROID, LEVOTHROID) 50 MCG tablet TAKE 1 TABLET BY MOUTH EVERY DAY 90 tablet 3  . tamsulosin (FLOMAX) 0.4 MG CAPS capsule Take 1 capsule by mouth daily.  0  . Zinc Oxide 16 % OINT Apply 2-3 x a day prn 113 g 2   No current facility-administered medications for this visit.   No Known Allergies   Review of Systems: Gen: Denies any fever, chills, sweats, anorexia, fatigue, weakness, malaise, weight loss, and sleep disorder CV: Denies chest pain, angina, palpitations, syncope, orthopnea, PND, peripheral edema, and claudication. Resp: Denies dyspnea at rest, dyspnea with exercise, cough, sputum, wheezing, coughing up blood, and pleurisy. GI: Denies vomiting blood, jaundice, and  fecal incontinence.   Denies dysphagia or odynophagia. GU : Denies urinary burning, blood in urine, urinary frequency, urinary hesitancy, nocturnal urination, and urinary incontinence. MS: Denies joint pain, limitation of movement, and swelling, stiffness, low back pain, extremity pain. Denies muscle weakness, cramps, atrophy.  Derm: Denies rash, itching, dry skin, hives, moles, warts, or unhealing ulcers.  Psych: Denies depression, anxiety, memory loss, suicidal ideation, hallucinations, paranoia, and confusion. Heme: Denies bruising, bleeding, and enlarged lymph nodes. Neuro:  Denies any headaches, dizziness, paresthesias. Endo:  Denies any problems with DM, thyroid, adrenal function  Studies:  Show images for CT Abdomen Pelvis W Contrast     Study Result     CLINICAL DATA: Diarrhea with bloody stool.  EXAM: CT ABDOMEN AND PELVIS WITH CONTRAST  TECHNIQUE: Multidetector CT imaging of the abdomen and pelvis was performed using the standard protocol following bolus administration of intravenous contrast.  CONTRAST: 37mL OMNIPAQUE IOHEXOL 300 MG/ML SOLN, 139mL OMNIPAQUE IOHEXOL 300 MG/ML SOLN  COMPARISON: None.  FINDINGS: Multilevel degenerative disc disease is noted in lower lumbar spine. Bilateral pars defects are seen at L5. Visualized lung bases appear normal.  No gallstones are noted. Fatty infiltration of the liver is noted. Simple cyst is seen in left hepatic lobe. The spleen and pancreas appear normal. Adrenal glands appear normal. No hydronephrosis or renal obstruction is noted. No renal or ureteral calculi are noted. There is no evidence of bowel obstruction. Atherosclerotic calcifications of abdominal aorta and iliac arteries are noted without aneurysm formation. The appendix appears normal. No abnormal fluid collection is noted. Large amount of stool is noted in the distal sigmoid colon and rectum consistent with impaction. Moderate urinary bladder  distention is noted. 1 cm enhancing nodule is noted inferiorly within the urinary bladder; this may simply represent extension of enlarged prostate gland, but possible neoplasm cannot be excluded. No significant adenopathy is noted. Mild bilateral fat containing inguinal hernias are noted.  IMPRESSION: Moderate urinary bladder distention is noted concerning for bladder outlet obstruction. Mild prostate hypertrophy is noted. There is noted a 1 cm enhancing nodule inferiorly within the urinary bladder which may simply represent extension of enlarged prostate gland, but small neoplasm or malignancy cannot be excluded. Cystoscopy is recommended for further evaluation.  Large amount of stool seen in distal sigmoid colon and rectum concerning for impaction.  Mild bilateral fat containing inguinal hernias.  Fatty infiltration of the liver.   Electronically Signed  By: Marijo Conception, M.D.  On: 10/10/2014 21:45      LAB RESULTS:  CBC on 11/08/2014 had white blood cell 10.2, hemoglobin 15.9, hematocrit 47.2, platelets 229,000. MCV 85.   Stool for occult blood on 10/10/2014 was  positive Prior Endoscopies:    See history of present illness  Physical Exam: BP 124/76 mmHg  Pulse 76  Ht 5' 3.5" (1.613 m)  Wt 198 lb 4 oz (89.926 kg)  BMI 34.56 kg/m2 Constitutional: Pleasant,well-developed male in no acute distress. HEENT: Normocephalic and atraumatic. Conjunctivae are normal. No scleral icterus. Neck supple.  No cervical adenopathy Cardiovascular: Normal rate, regular rhythm.  Pulmonary/chest: Effort normal and breath sounds normal. No wheezing, rales or rhonchi. Abdominal: Soft, nondistended, nontender. Bowel sounds active throughout. There are no masses palpable. No hepatomegaly. Rectal: stool in vault, brown, heme negative. Extremities: no edema Lymphadenopathy: No cervical adenopathy noted. Neurological: Alert and oriented to person place and time. Skin: Skin is warm  and dry. No rashes noted. Psychiatric: Normal mood and affect. Behavior is normal.  ASSESSMENT AND PLAN:  #1. Constipation. Patient has been instructed to increase water intake. He will use Mira lax 2 capfuls in water daily. He may titrate the dose up or down as needed he will also use a glycerin suppository each morning as needed.    #2. Personal history of adenomatous polyps. We have discussed surveillance colonoscopy with the patient and his daughter and they would like to proceed with the colonoscopy at this time as "1 last exam ".The risks, benefits, and alternatives to colonoscopy with possible biopsy and possible polypectomy were discussed with the patient and they consent to proceed.   The procedure will be scheduled with Dr. Hilarie Fredrickson. Further recommendations will be made pending the findings of the above.    Min Tunnell, Deloris Ping 11/16/2014, 2:46 PM  CC: Rosalita Chessman, DO   Addendum: Reviewed and agree with initial management. Jerene Bears, MD

## 2014-11-16 NOTE — Patient Instructions (Signed)
You have been scheduled for a colonoscopy. Please follow written instructions given to you at your visit today.  Please pick up your prep supplies at the pharmacy within the next 1-3 days. If you use inhalers (even only as needed), please bring them with you on the day of your procedure. Your physician has requested that you go to www.startemmi.com and enter the access code given to you at your visit today. This web site gives a general overview about your procedure. However, you should still follow specific instructions given to you by our office regarding your preparation for the procedure. Use miralax 2 capfuls in water daily. Increase fluids. Glycerin Suppositories 1 per rectum every morning.

## 2014-11-22 ENCOUNTER — Encounter: Payer: Self-pay | Admitting: Emergency Medicine

## 2014-11-22 ENCOUNTER — Encounter: Payer: Self-pay | Admitting: Internal Medicine

## 2014-12-09 ENCOUNTER — Encounter: Payer: Self-pay | Admitting: Internal Medicine

## 2014-12-14 ENCOUNTER — Other Ambulatory Visit: Payer: Self-pay | Admitting: Family Medicine

## 2014-12-16 ENCOUNTER — Telehealth: Payer: Self-pay | Admitting: Family Medicine

## 2014-12-16 MED ORDER — HYDROCHLOROTHIAZIDE 25 MG PO TABS: 25.0000 mg | ORAL_TABLET | Freq: Every day | ORAL | Status: DC

## 2014-12-16 NOTE — Telephone Encounter (Signed)
Pharmacy called in regarding this. Requesting refill.

## 2014-12-16 NOTE — Telephone Encounter (Signed)
Caller name: Rochele Raring  Relation to pt: daughter  Call back number: [phone] Pharmacy: [id number] - [place], [place] - [address] [phone] (Phone) [phone] (Fax)        Reason for call:  As per pt daughter pharmacy never received hydrochlorothiazide (HYDRODIURIL) 25 MG tablet

## 2014-12-29 ENCOUNTER — Other Ambulatory Visit: Payer: Self-pay | Admitting: Family Medicine

## 2015-01-03 ENCOUNTER — Encounter: Payer: Medicare Other | Admitting: Internal Medicine

## 2015-02-08 ENCOUNTER — Encounter: Payer: Medicare Other | Admitting: Internal Medicine

## 2015-04-03 ENCOUNTER — Other Ambulatory Visit: Payer: Self-pay | Admitting: Family Medicine

## 2015-05-14 ENCOUNTER — Other Ambulatory Visit: Payer: Self-pay | Admitting: Family Medicine

## 2015-05-18 ENCOUNTER — Ambulatory Visit: Payer: Medicare Other | Admitting: Family Medicine

## 2015-06-19 ENCOUNTER — Ambulatory Visit (INDEPENDENT_AMBULATORY_CARE_PROVIDER_SITE_OTHER): Payer: Medicare Other | Admitting: Family Medicine

## 2015-06-19 ENCOUNTER — Encounter: Payer: Self-pay | Admitting: Family Medicine

## 2015-06-19 VITALS — BP 124/82 | HR 73 | Temp 98.6°F | Ht 64.0 in | Wt 209.4 lb

## 2015-06-19 DIAGNOSIS — R82998 Other abnormal findings in urine: Secondary | ICD-10-CM

## 2015-06-19 DIAGNOSIS — R4189 Other symptoms and signs involving cognitive functions and awareness: Secondary | ICD-10-CM

## 2015-06-19 DIAGNOSIS — N390 Urinary tract infection, site not specified: Secondary | ICD-10-CM

## 2015-06-19 DIAGNOSIS — R1032 Left lower quadrant pain: Secondary | ICD-10-CM

## 2015-06-19 DIAGNOSIS — E785 Hyperlipidemia, unspecified: Secondary | ICD-10-CM | POA: Diagnosis not present

## 2015-06-19 LAB — POCT URINALYSIS DIPSTICK
BILIRUBIN UA: NEGATIVE
GLUCOSE UA: NEGATIVE
Ketones, UA: NEGATIVE
NITRITE UA: POSITIVE
Protein, UA: NEGATIVE
Spec Grav, UA: 1.025
Urobilinogen, UA: 2
pH, UA: 6

## 2015-06-19 MED ORDER — ATORVASTATIN CALCIUM 10 MG PO TABS: ORAL_TABLET | ORAL | Status: DC

## 2015-06-19 MED ORDER — LEVOFLOXACIN 500 MG PO TABS: 500.0000 mg | ORAL_TABLET | Freq: Every day | ORAL | Status: DC

## 2015-06-19 NOTE — Progress Notes (Signed)
Pre visit review using our clinic review tool, if applicable. No additional management support is needed unless otherwise documented below in the visit note. 

## 2015-06-19 NOTE — Progress Notes (Signed)
Patient ID: CARA PAYMENT, male    DOB: 1926-05-09  Age: 81 y.o. MRN: [id number]    Subjective:  Subjective   LANNY VONWALD presents for c/o uri symptoms and L hip pain --son and daughter are present.  He has  Been more confused the last few days as well.  Pt denies dysuria, frequency or urgency.  No back pain.  Pt is complainging of sore throat , sneezing, runny nose and cough.  Not productive.  Review of Systems  Constitutional: Positive for chills. Negative for fever.  HENT: Positive for congestion, postnasal drip, rhinorrhea, sneezing and sore throat. Negative for sinus pressure and trouble swallowing.   Respiratory: Positive for cough. Negative for chest tightness, shortness of breath and wheezing.   Cardiovascular: Negative for chest pain, palpitations and leg swelling.  Genitourinary: Negative for dysuria, urgency, frequency and difficulty urinating.  Musculoskeletal: Positive for myalgias.  Allergic/Immunologic: Negative for environmental allergies.  Psychiatric/Behavioral: Positive for confusion. Negative for behavioral problems.    History Past Medical History  Diagnosis Date  . Hyperlipidemia   . Hypertension   . Dementia   . Enlarged prostate   . Hypothyroidism   . Diverticulosis   . Colon polyps     He has past surgical history that includes Cataract extraction (Left, 07/18/08); Prostate surgery (3/08); Cataract extraction (Right); Tonsillectomy; and Colonoscopy.   His family history includes Esophageal cancer in his brother; Heart disease in an other family member; Hypertension in his mother; Stroke in his mother.He reports that he quit smoking about 52 years ago. His smoking use included Cigarettes. He has a 20 pack-year smoking history. He has never used smokeless tobacco. He reports that he drinks alcohol. He reports that he does not use illicit drugs.  Current Outpatient Prescriptions on File Prior to Visit  Medication Sig Dispense Refill  . aspirin 81 MG EC  tablet Take 81 mg by mouth daily.      Marland Kitchen donepezil (ARICEPT) 10 MG tablet TAKE 1 TABLET BY MOUTH AT BEDTIME AS NEEDED 90 tablet 1  . finasteride (PROSCAR) 5 MG tablet Take 5 mg by mouth as directed.     . hydrochlorothiazide (HYDRODIURIL) 25 MG tablet TAKE 1 TABLET BY MOUTH DAILY 90 tablet 0  . levothyroxine (SYNTHROID, LEVOTHROID) 50 MCG tablet TAKE 1 TABLET BY MOUTH EVERY DAY 90 tablet 3  . Zinc Oxide 16 % OINT Apply 2-3 x a day prn 113 g 2   No current facility-administered medications on file prior to visit.     Objective:  Objective Physical Exam  Constitutional: He is oriented to person, place, and time. He appears well-developed and well-nourished.  HENT:  Right Ear: External ear normal.  Left Ear: External ear normal.  + PND + errythema  Eyes: Conjunctivae are normal. Right eye exhibits no discharge. Left eye exhibits no discharge.  Neck: Normal range of motion. Neck supple.  Cardiovascular: Normal rate, regular rhythm and normal heart sounds.   No murmur heard. Pulmonary/Chest: Effort normal and breath sounds normal. No respiratory distress. He has no wheezes. He has no rales. He exhibits no tenderness.  Abdominal: Soft. There is no tenderness.  Musculoskeletal: He exhibits no edema or tenderness.  Lymphadenopathy:    He has no cervical adenopathy.  Neurological: He is alert and oriented to person, place, and time.  Psychiatric: He has a normal mood and affect. His behavior is normal.  Nursing note and vitals reviewed.  BP 124/82 mmHg  Pulse 73  Temp(Src) 98.6 F (37  C) (Oral)  Ht 5\' 4"  (1.626 m)  Wt 209 lb 6.4 oz (94.983 kg)  BMI 35.93 kg/m2  SpO2 95% Wt Readings from Last 3 Encounters:  06/19/15 209 lb 6.4 oz (94.983 kg)  11/16/14 198 lb 4 oz (89.926 kg)  11/08/14 199 lb 9.6 oz (90.538 kg)     Lab Results  Component Value Date   WBC 10.2 11/08/2014   HGB 15.9 11/08/2014   HCT 47.2 11/08/2014   PLT 229.0 11/08/2014   GLUCOSE 115* 11/08/2014   CHOL 139  11/08/2014   TRIG 72.0 11/08/2014   HDL 47.80 11/08/2014   LDLDIRECT 147.9 07/15/2011   LDLCALC 77 11/08/2014   ALT 26 11/08/2014   AST 19 11/08/2014   NA 138 11/08/2014   K 3.4* 11/08/2014   CL 100 11/08/2014   CREATININE 1.06 11/08/2014   BUN 24* 11/08/2014   CO2 30 11/08/2014   TSH 3.33 11/08/2014   PSA 6.92* 11/08/2014    Ct Abdomen Pelvis W Contrast  10/10/2014  CLINICAL DATA:  Diarrhea with bloody stool. EXAM: CT ABDOMEN AND PELVIS WITH CONTRAST TECHNIQUE: Multidetector CT imaging of the abdomen and pelvis was performed using the standard protocol following bolus administration of intravenous contrast. CONTRAST:  18mL OMNIPAQUE IOHEXOL 300 MG/ML SOLN, 126mL OMNIPAQUE IOHEXOL 300 MG/ML SOLN COMPARISON:  None. FINDINGS: Multilevel degenerative disc disease is noted in lower lumbar spine. Bilateral pars defects are seen at L5. Visualized lung bases appear normal. No gallstones are noted. Fatty infiltration of the liver is noted. Simple cyst is seen in left hepatic lobe. The spleen and pancreas appear normal. Adrenal glands appear normal. No hydronephrosis or renal obstruction is noted. No renal or ureteral calculi are noted. There is no evidence of bowel obstruction. Atherosclerotic calcifications of abdominal aorta and iliac arteries are noted without aneurysm formation. The appendix appears normal. No abnormal fluid collection is noted. Large amount of stool is noted in the distal sigmoid colon and rectum consistent with impaction. Moderate urinary bladder distention is noted. 1 cm enhancing nodule is noted inferiorly within the urinary bladder; this may simply represent extension of enlarged prostate gland, but possible neoplasm cannot be excluded. No significant adenopathy is noted. Mild bilateral fat containing inguinal hernias are noted. IMPRESSION: Moderate urinary bladder distention is noted concerning for bladder outlet obstruction. Mild prostate hypertrophy is noted. There is noted a 1  cm enhancing nodule inferiorly within the urinary bladder which may simply represent extension of enlarged prostate gland, but small neoplasm or malignancy cannot be excluded. Cystoscopy is recommended for further evaluation. Large amount of stool seen in distal sigmoid colon and rectum concerning for impaction. Mild bilateral fat containing inguinal hernias. Fatty infiltration of the liver. Electronically Signed   By: Marijo Conception, M.D.   On: 10/10/2014 21:45     Assessment & Plan:  Plan I have discontinued Mr. Satira Anis tamsulosin and glycerin adult. I have also changed his atorvastatin. Additionally, I am having him start on levofloxacin. Lastly, I am having him maintain his aspirin, finasteride, levothyroxine, Zinc Oxide, hydrochlorothiazide, donepezil, and polyethylene glycol.  Meds ordered this encounter  Medications  . polyethylene glycol (MIRALAX / GLYCOLAX) packet    Sig: Take 17 g by mouth daily.  Marland Kitchen levofloxacin (LEVAQUIN) 500 MG tablet    Sig: Take 1 tablet (500 mg total) by mouth daily.    Dispense:  7 tablet    Refill:  0  . atorvastatin (LIPITOR) 10 MG tablet    Sig: 1 po every other  day    Dispense:  45 tablet    Refill:  1    Problem List Items Addressed This Visit    UTI (urinary tract infection)    May be cause of confusion and hip pain abx per orders  rto per ofders      Relevant Medications   levofloxacin (LEVAQUIN) 500 MG tablet   Hyperlipidemia    Check labs Pt was taking lipitor every other day--- assisted living has been giving it to him daily This may also be why he is having myalgias Change back to every other day Check labs      Relevant Medications   atorvastatin (LIPITOR) 10 MG tablet    Other Visit Diagnoses    Groin pain, left    -  Primary    Relevant Orders    POCT Urinalysis Dipstick (Completed)    Leukocytes in urine        Relevant Orders    Urine Culture    Cognitive impairment        Relevant Orders    Urine Culture        Follow-up: Return if symptoms worsen or fail to improve.  Garnet Koyanagi, DO

## 2015-06-19 NOTE — Assessment & Plan Note (Signed)
Check labs Pt was taking lipitor every other day--- assisted living has been giving it to him daily This may also be why he is having myalgias Change back to every other day Check labs

## 2015-06-19 NOTE — Assessment & Plan Note (Signed)
May be cause of confusion and hip pain abx per orders  rto per ofders

## 2015-06-19 NOTE — Patient Instructions (Signed)

## 2015-06-22 LAB — URINE CULTURE: Colony Count: >=100000

## 2015-06-23 ENCOUNTER — Other Ambulatory Visit: Payer: Self-pay

## 2015-06-23 MED ORDER — SULFAMETHOXAZOLE-TRIMETHOPRIM 800-160 MG PO TABS: 1.0000 | ORAL_TABLET | Freq: Two times a day (BID) | ORAL | Status: DC

## 2015-07-03 ENCOUNTER — Other Ambulatory Visit: Payer: Self-pay | Admitting: Family Medicine

## 2015-07-13 ENCOUNTER — Encounter: Payer: Self-pay | Admitting: Family Medicine

## 2015-07-13 ENCOUNTER — Ambulatory Visit (INDEPENDENT_AMBULATORY_CARE_PROVIDER_SITE_OTHER): Payer: Medicare Other | Admitting: Family Medicine

## 2015-07-13 VITALS — BP 132/60 | HR 64 | Temp 97.5°F | Ht 64.0 in | Wt 211.6 lb

## 2015-07-13 DIAGNOSIS — N390 Urinary tract infection, site not specified: Secondary | ICD-10-CM | POA: Diagnosis not present

## 2015-07-13 DIAGNOSIS — Z23 Encounter for immunization: Secondary | ICD-10-CM | POA: Diagnosis not present

## 2015-07-13 DIAGNOSIS — R413 Other amnesia: Secondary | ICD-10-CM

## 2015-07-13 DIAGNOSIS — E039 Hypothyroidism, unspecified: Secondary | ICD-10-CM

## 2015-07-13 DIAGNOSIS — I10 Essential (primary) hypertension: Secondary | ICD-10-CM

## 2015-07-13 DIAGNOSIS — Z87440 Personal history of urinary (tract) infections: Secondary | ICD-10-CM

## 2015-07-13 DIAGNOSIS — E785 Hyperlipidemia, unspecified: Secondary | ICD-10-CM | POA: Diagnosis not present

## 2015-07-13 DIAGNOSIS — E669 Obesity, unspecified: Secondary | ICD-10-CM

## 2015-07-13 LAB — COMPREHENSIVE METABOLIC PANEL
ALT: 17 U/L (ref 0–53)
AST: 14 U/L (ref 0–37)
Albumin: 3.7 g/dL (ref 3.5–5.2)
Alkaline Phosphatase: 93 U/L (ref 39–117)
BUN: 22 mg/dL (ref 6–23)
CHLORIDE: 99 meq/L (ref 96–112)
CO2: 32 meq/L (ref 19–32)
CREATININE: 0.97 mg/dL (ref 0.40–1.50)
Calcium: 9.4 mg/dL (ref 8.4–10.5)
GFR: 77.76 mL/min (ref >60.00)
Glucose, Bld: 105 mg/dL — ABNORMAL HIGH (ref 70–99)
Potassium: 4 mEq/L (ref 3.5–5.1)
Sodium: 136 mEq/L (ref 135–145)
Total Bilirubin: 0.8 mg/dL (ref 0.2–1.2)
Total Protein: 6.8 g/dL (ref 6.0–8.3)

## 2015-07-13 LAB — THYROID PANEL WITH TSH
Free Thyroxine Index: 2 (ref 1.4–3.8)
T3 Uptake: 35 % (ref 22–35)
T4, Total: 5.6 ug/dL (ref 4.5–12.0)
TSH: 2.647 u[IU]/mL (ref 0.350–4.500)

## 2015-07-13 LAB — LIPID PANEL
CHOL/HDL RATIO: 3
Cholesterol: 146 mg/dL (ref 0–200)
HDL: 45.4 mg/dL (ref >39.00)
LDL CALC: 85 mg/dL (ref 0–99)
NonHDL: 101.09
Triglycerides: 80 mg/dL (ref 0.0–149.0)
VLDL: 16 mg/dL (ref 0.0–40.0)

## 2015-07-13 MED ORDER — LEVOTHYROXINE SODIUM 50 MCG PO TABS: 50.0000 ug | ORAL_TABLET | Freq: Every day | ORAL | Status: DC

## 2015-07-13 MED ORDER — DONEPEZIL HCL 10 MG PO TABS: 10.0000 mg | ORAL_TABLET | Freq: Every evening | ORAL | Status: DC | PRN

## 2015-07-13 MED ORDER — ATORVASTATIN CALCIUM 10 MG PO TABS: 10.0000 mg | ORAL_TABLET | ORAL | Status: DC

## 2015-07-13 MED ORDER — HYDROCHLOROTHIAZIDE 25 MG PO TABS: 25.0000 mg | ORAL_TABLET | Freq: Every day | ORAL | Status: DC

## 2015-07-13 MED ORDER — FINASTERIDE 5 MG PO TABS: 5.0000 mg | ORAL_TABLET | ORAL | Status: DC

## 2015-07-13 NOTE — Assessment & Plan Note (Signed)
Recheck today. 

## 2015-07-13 NOTE — Assessment & Plan Note (Signed)
stable °

## 2015-07-13 NOTE — Assessment & Plan Note (Signed)
Discussed getting involved in activities at home but pt states he is "not interested"

## 2015-07-13 NOTE — Progress Notes (Signed)
Patient ID: Louis Patel, male    DOB: 1926/04/10  Age: 82 y.o. MRN: [id number]    Subjective:  Subjective HPI [name] presents for f/u uti , uri.  He is doing better but memory has been getting worse.  Pt saw Neuro in 2013 and was supposed to f/u in 6 months but did not.  His daughter was unaware.  Pt is in a assisted living and is getting meds regularly.    Review of Systems  Constitutional: Negative for diaphoresis, appetite change, fatigue and unexpected weight change.  Eyes: Negative for pain, redness and visual disturbance.  Respiratory: Negative for cough, chest tightness, shortness of breath and wheezing.   Cardiovascular: Negative for chest pain, palpitations and leg swelling.  Endocrine: Negative for cold intolerance, heat intolerance, polydipsia, polyphagia and polyuria.  Genitourinary: Negative for dysuria, frequency and difficulty urinating.  Neurological: Negative for dizziness, light-headedness, numbness and headaches.  Psychiatric/Behavioral: Positive for confusion. Negative for behavioral problems.    History Past Medical History  Diagnosis Date  . Hyperlipidemia   . Hypertension   . Dementia   . Enlarged prostate   . Hypothyroidism   . Diverticulosis   . Colon polyps     He has past surgical history that includes Cataract extraction (Left, 07/18/08); Prostate surgery (3/08); Cataract extraction (Right); Tonsillectomy; and Colonoscopy.   His family history includes Esophageal cancer in his brother; Hypertension in his mother; Stroke in his mother.He reports that he quit smoking about 52 years ago. His smoking use included Cigarettes. He has a 20 pack-year smoking history. He has never used smokeless tobacco. He reports that he drinks alcohol. He reports that he does not use illicit drugs.  Current Outpatient Prescriptions on File Prior to Visit  Medication Sig Dispense Refill  . aspirin 81 MG EC tablet Take 81 mg by mouth daily.      . polyethylene  glycol (MIRALAX / GLYCOLAX) packet Take 17 g by mouth daily.     No current facility-administered medications on file prior to visit.     Objective:  Objective Physical Exam  Constitutional: He is oriented to person, place, and time. Vital signs are normal. He appears well-developed and well-nourished. He is sleeping.  HENT:  Head: Normocephalic and atraumatic.  Mouth/Throat: Oropharynx is clear and moist.  Eyes: EOM are normal. Pupils are equal, round, and reactive to light.  Neck: Normal range of motion. Neck supple. No thyromegaly present.  Cardiovascular: Normal rate and regular rhythm.   No murmur heard. Pulmonary/Chest: Effort normal and breath sounds normal. No respiratory distress. He has no wheezes. He has no rales. He exhibits no tenderness.  Musculoskeletal: He exhibits no edema or tenderness.  Neurological: He is alert and oriented to person, place, and time.  Skin: Skin is warm and dry.  Psychiatric: He has a normal mood and affect. His behavior is normal. Judgment and thought content normal.  Nursing note and vitals reviewed.  BP 132/60 mmHg  Pulse 64  Temp(Src) 97.5 F (36.4 C) (Oral)  Ht _0  (1.626 m)  Wt 211 lb 9.6 oz (95.981 kg)  BMI 36.30 kg/m2  SpO2 98% Wt Readings from Last 3 Encounters:  07/13/15 211 lb 9.6 oz (95.981 kg)  06/19/15 209 lb 6.4 oz (94.983 kg)  11/16/14 198 lb 4 oz (89.926 kg)     Lab Results  Component Value Date   WBC 10.2 11/08/2014   HGB 15.9 11/08/2014   HCT 47.2 11/08/2014   PLT 229.0 11/08/2014  GLUCOSE 105* 07/13/2015   CHOL 146 07/13/2015   TRIG 80.0 07/13/2015   HDL 45.40 07/13/2015   LDLDIRECT 147.9 07/15/2011   LDLCALC 85 07/13/2015   ALT 17 07/13/2015   AST 14 07/13/2015   NA 136 07/13/2015   K 4.0 07/13/2015   CL 99 07/13/2015   CREATININE 0.97 07/13/2015   BUN 22 07/13/2015   CO2 32 07/13/2015   TSH 2.647 07/13/2015   PSA 6.92* 11/08/2014    Ct Abdomen Pelvis W Contrast  10/10/2014  CLINICAL DATA:   Diarrhea with bloody stool. EXAM: CT ABDOMEN AND PELVIS WITH CONTRAST TECHNIQUE: Multidetector CT imaging of the abdomen and pelvis was performed using the standard protocol following bolus administration of intravenous contrast. CONTRAST:  50m OMNIPAQUE IOHEXOL 300 MG/ML SOLN, 1069mOMNIPAQUE IOHEXOL 300 MG/ML SOLN COMPARISON:  None. FINDINGS: Multilevel degenerative disc disease is noted in lower lumbar spine. Bilateral pars defects are seen at L5. Visualized lung bases appear normal. No gallstones are noted. Fatty infiltration of the liver is noted. Simple cyst is seen in left hepatic lobe. The spleen and pancreas appear normal. Adrenal glands appear normal. No hydronephrosis or renal obstruction is noted. No renal or ureteral calculi are noted. There is no evidence of bowel obstruction. Atherosclerotic calcifications of abdominal aorta and iliac arteries are noted without aneurysm formation. The appendix appears normal. No abnormal fluid collection is noted. Large amount of stool is noted in the distal sigmoid colon and rectum consistent with impaction. Moderate urinary bladder distention is noted. 1 cm enhancing nodule is noted inferiorly within the urinary bladder; this may simply represent extension of enlarged prostate gland, but possible neoplasm cannot be excluded. No significant adenopathy is noted. Mild bilateral fat containing inguinal hernias are noted. IMPRESSION: Moderate urinary bladder distention is noted concerning for bladder outlet obstruction. Mild prostate hypertrophy is noted. There is noted a 1 cm enhancing nodule inferiorly within the urinary bladder which may simply represent extension of enlarged prostate gland, but small neoplasm or malignancy cannot be excluded. Cystoscopy is recommended for further evaluation. Large amount of stool seen in distal sigmoid colon and rectum concerning for impaction. Mild bilateral fat containing inguinal hernias. Fatty infiltration of the liver.  Electronically Signed   By: JaMarijo ConceptionM.D.   On: 10/10/2014 21:45     Assessment & Plan:  Plan I have discontinued Mr. MaSatira Anisinc Oxide, levofloxacin, and sulfamethoxazole-trimethoprim. I have also changed his atorvastatin, donepezil, finasteride, hydrochlorothiazide, and levothyroxine. Additionally, I am having him maintain his aspirin and polyethylene glycol.  Meds ordered this encounter  Medications  . atorvastatin (LIPITOR) 10 MG tablet    Sig: Take 1 tablet (10 mg total) by mouth every other day. Repeat labs are due now    Dispense:  90 tablet    Refill:  0  . donepezil (ARICEPT) 10 MG tablet    Sig: Take 1 tablet (10 mg total) by mouth at bedtime as needed.    Dispense:  90 tablet    Refill:  1  . finasteride (PROSCAR) 5 MG tablet    Sig: Take 1 tablet (5 mg total) by mouth as directed.  . hydrochlorothiazide (HYDRODIURIL) 25 MG tablet    Sig: Take 1 tablet (25 mg total) by mouth daily.    Dispense:  90 tablet    Refill:  0  . levothyroxine (SYNTHROID, LEVOTHROID) 50 MCG tablet    Sig: Take 1 tablet (50 mcg total) by mouth daily.    Dispense:  90 tablet  Refill:  3    Problem List Items Addressed This Visit    UTI (urinary tract infection)    Recheck today      Obesity (BMI 30-39.9)    Discussed getting involved in activities at home but pt states he is "not interested"      Memory loss   Relevant Medications   donepezil (ARICEPT) 10 MG tablet   Other Relevant Orders   Ambulatory referral to Neurology   Hyperlipidemia   Relevant Medications   atorvastatin (LIPITOR) 10 MG tablet   hydrochlorothiazide (HYDRODIURIL) 25 MG tablet   Other Relevant Orders   Lipid panel (Completed)   Comp Met (CMET) (Completed)   Essential hypertension    stable      Relevant Medications   atorvastatin (LIPITOR) 10 MG tablet   finasteride (PROSCAR) 5 MG tablet   hydrochlorothiazide (HYDRODIURIL) 25 MG tablet    Other Visit Diagnoses    Need for immunization  against influenza    -  Primary    Relevant Orders    Flu vaccine HIGH DOSE PF (Fluzone High dose) (Completed)    Hypothyroidism, unspecified hypothyroidism type        Relevant Medications    levothyroxine (SYNTHROID, LEVOTHROID) 50 MCG tablet    Other Relevant Orders    Thyroid Panel With TSH (Completed)    History of UTI        Relevant Orders    POCT urinalysis dipstick (Completed)       Follow-up: Return in about 6 months (around 01/10/2016), or if symptoms worsen or fail to improve, for hypertension, hyperlipidemia.  Garnet Koyanagi, DO

## 2015-07-13 NOTE — Progress Notes (Signed)
Pre visit review using our clinic review tool, if applicable. No additional management support is needed unless otherwise documented below in the visit note. 

## 2015-07-13 NOTE — Patient Instructions (Signed)

## 2015-07-14 LAB — POCT URINALYSIS DIPSTICK
BILIRUBIN UA: NEGATIVE
Blood, UA: NEGATIVE
Glucose, UA: NEGATIVE
KETONES UA: NEGATIVE
LEUKOCYTES UA: NEGATIVE
Nitrite, UA: NEGATIVE
Protein, UA: NEGATIVE
Spec Grav, UA: 1.02
Urobilinogen, UA: 0.2
pH, UA: 6

## 2015-07-15 ENCOUNTER — Encounter: Payer: Self-pay | Admitting: Family Medicine

## 2015-08-21 ENCOUNTER — Encounter: Payer: Self-pay | Admitting: Family Medicine

## 2015-09-09 ENCOUNTER — Other Ambulatory Visit: Payer: Self-pay | Admitting: Family Medicine

## 2015-10-04 ENCOUNTER — Other Ambulatory Visit: Payer: Self-pay | Admitting: Family Medicine

## 2016-01-18 ENCOUNTER — Ambulatory Visit: Payer: Medicare Other | Admitting: Family Medicine

## 2016-01-18 DIAGNOSIS — Z0289 Encounter for other administrative examinations: Secondary | ICD-10-CM

## 2016-01-19 ENCOUNTER — Encounter: Payer: Self-pay | Admitting: Family Medicine

## 2016-01-25 ENCOUNTER — Ambulatory Visit (INDEPENDENT_AMBULATORY_CARE_PROVIDER_SITE_OTHER): Payer: Medicare Other | Admitting: Family Medicine

## 2016-01-25 ENCOUNTER — Other Ambulatory Visit: Payer: Self-pay | Admitting: Family Medicine

## 2016-01-25 ENCOUNTER — Encounter: Payer: Self-pay | Admitting: Family Medicine

## 2016-01-25 VITALS — BP 126/78 | HR 82 | Temp 97.8°F | Ht 66.0 in | Wt 210.2 lb

## 2016-01-25 DIAGNOSIS — I10 Essential (primary) hypertension: Secondary | ICD-10-CM | POA: Diagnosis not present

## 2016-01-25 DIAGNOSIS — R413 Other amnesia: Secondary | ICD-10-CM

## 2016-01-25 DIAGNOSIS — E785 Hyperlipidemia, unspecified: Secondary | ICD-10-CM

## 2016-01-25 DIAGNOSIS — E039 Hypothyroidism, unspecified: Secondary | ICD-10-CM

## 2016-01-25 DIAGNOSIS — E038 Other specified hypothyroidism: Secondary | ICD-10-CM | POA: Diagnosis not present

## 2016-01-25 LAB — LIPID PANEL
CHOLESTEROL: 206 mg/dL — AB (ref 0–200)
HDL: 46.7 mg/dL (ref >39.00)
LDL Cholesterol: 135 mg/dL — ABNORMAL HIGH (ref 0–99)
NonHDL: 159.5
Total CHOL/HDL Ratio: 4
Triglycerides: 121 mg/dL (ref 0.0–149.0)
VLDL: 24.2 mg/dL (ref 0.0–40.0)

## 2016-01-25 LAB — COMPREHENSIVE METABOLIC PANEL
ALBUMIN: 4 g/dL (ref 3.5–5.2)
ALK PHOS: 64 U/L (ref 39–117)
ALT: 24 U/L (ref 0–53)
AST: 18 U/L (ref 0–37)
BUN: 24 mg/dL — AB (ref 6–23)
CHLORIDE: 99 meq/L (ref 96–112)
CO2: 31 mEq/L (ref 19–32)
CREATININE: 1.09 mg/dL (ref 0.40–1.50)
Calcium: 9.5 mg/dL (ref 8.4–10.5)
GFR: 67.88 mL/min (ref >60.00)
Glucose, Bld: 108 mg/dL — ABNORMAL HIGH (ref 70–99)
Potassium: 3.7 mEq/L (ref 3.5–5.1)
SODIUM: 138 meq/L (ref 135–145)
TOTAL PROTEIN: 7.1 g/dL (ref 6.0–8.3)
Total Bilirubin: 0.9 mg/dL (ref 0.2–1.2)

## 2016-01-25 LAB — TSH: TSH: 2.63 u[IU]/mL (ref 0.35–4.50)

## 2016-01-25 MED ORDER — DONEPEZIL HCL 23 MG PO TABS
23.0000 mg | ORAL_TABLET | Freq: Every day | ORAL | Status: DC
Start: 2016-01-25 — End: 2016-02-01

## 2016-01-25 NOTE — Progress Notes (Signed)
Patient ID: Louis Patel, male    DOB: 05-May-1926  Age: 82 y.o. MRN: [id number]    Subjective:  Subjective HPI FAITH BETSILL presents for f/u cholesterol, thyroid and bp.  Pt daughter is c/o increase in memory loss andshuffling gait  He has not been to the neuro in 3-4 years.   No other complaints.    Review of Systems  Constitutional: Negative for diaphoresis, appetite change, fatigue and unexpected weight change.  Eyes: Negative for pain, redness and visual disturbance.  Respiratory: Negative for cough, chest tightness, shortness of breath and wheezing.   Cardiovascular: Negative for chest pain, palpitations and leg swelling.  Endocrine: Negative for cold intolerance, heat intolerance, polydipsia, polyphagia and polyuria.  Genitourinary: Negative for dysuria, frequency and difficulty urinating.  Neurological: Negative for dizziness, light-headedness, numbness and headaches.  Psychiatric/Behavioral: Positive for confusion. Negative for sleep disturbance.    History Past Medical History  Diagnosis Date  . Hyperlipidemia   . Hypertension   . Dementia   . Enlarged prostate   . Hypothyroidism   . Diverticulosis   . Colon polyps     He has past surgical history that includes Cataract extraction (Left, 07/18/08); Prostate surgery (3/08); Cataract extraction (Right); Tonsillectomy; and Colonoscopy.   His family history includes Esophageal cancer in his brother; Hypertension in his mother; Stroke in his mother.He reports that he quit smoking about 53 years ago. His smoking use included Cigarettes. He has a 20 pack-year smoking history. He has never used smokeless tobacco. He reports that he drinks alcohol. He reports that he does not use illicit drugs.  Current Outpatient Prescriptions on File Prior to Visit  Medication Sig Dispense Refill  . aspirin 81 MG EC tablet Take 81 mg by mouth daily.      Marland Kitchen atorvastatin (LIPITOR) 10 MG tablet Take 1 tablet (10 mg total) by mouth every other  day. Repeat labs are due now 90 tablet 0  . finasteride (PROSCAR) 5 MG tablet Take 1 tablet (5 mg total) by mouth as directed.    . hydrochlorothiazide (HYDRODIURIL) 25 MG tablet TAKE 1 TABLET BY MOUTH DAILY 90 tablet 1  . levothyroxine (SYNTHROID, LEVOTHROID) 50 MCG tablet TAKE 1 TABLET BY MOUTH EVERY DAY 90 tablet 3  . polyethylene glycol (MIRALAX / GLYCOLAX) packet Take 17 g by mouth every other day. Reported on 01/25/2016     No current facility-administered medications on file prior to visit.     Objective:  Objective Physical Exam  Constitutional: He is oriented to person, place, and time. Vital signs are normal. He appears well-developed and well-nourished. He is sleeping.  HENT:  Head: Normocephalic and atraumatic.  Mouth/Throat: Oropharynx is clear and moist.  Eyes: EOM are normal. Pupils are equal, round, and reactive to light.  Neck: Normal range of motion. Neck supple. No thyromegaly present.  Cardiovascular: Normal rate and regular rhythm.   No murmur heard. Pulmonary/Chest: Effort normal and breath sounds normal. No respiratory distress. He has no wheezes. He has no rales. He exhibits no tenderness.  Musculoskeletal: He exhibits no edema or tenderness.  Neurological: He is alert and oriented to person, place, and time.  Skin: Skin is warm and dry.  Psychiatric: He has a normal mood and affect. His behavior is normal. Judgment and thought content normal. Cognition and memory are impaired. He exhibits abnormal recent memory.  Nursing note and vitals reviewed.  BP 126/78 mmHg  Pulse 82  Temp(Src) 97.8 F (36.6 C) (Oral)  Ht 5\' 6"  (1.676  m)  Wt 210 lb 4 oz (95.369 kg)  BMI 33.95 kg/m2  SpO2 95% Wt Readings from Last 3 Encounters:  01/25/16 210 lb 4 oz (95.369 kg)  07/13/15 211 lb 9.6 oz (95.981 kg)  06/19/15 209 lb 6.4 oz (94.983 kg)     Lab Results  Component Value Date   WBC 10.2 11/08/2014   HGB 15.9 11/08/2014   HCT 47.2 11/08/2014   PLT 229.0 11/08/2014    GLUCOSE 108* 01/25/2016   CHOL 206* 01/25/2016   TRIG 121.0 01/25/2016   HDL 46.70 01/25/2016   LDLDIRECT 147.9 07/15/2011   LDLCALC 135* 01/25/2016   ALT 24 01/25/2016   AST 18 01/25/2016   NA 138 01/25/2016   K 3.7 01/25/2016   CL 99 01/25/2016   CREATININE 1.09 01/25/2016   BUN 24* 01/25/2016   CO2 31 01/25/2016   TSH 2.63 01/25/2016   PSA 6.92* 11/08/2014    Ct Abdomen Pelvis W Contrast  10/10/2014  CLINICAL DATA:  Diarrhea with bloody stool. EXAM: CT ABDOMEN AND PELVIS WITH CONTRAST TECHNIQUE: Multidetector CT imaging of the abdomen and pelvis was performed using the standard protocol following bolus administration of intravenous contrast. CONTRAST:  66mL OMNIPAQUE IOHEXOL 300 MG/ML SOLN, 121mL OMNIPAQUE IOHEXOL 300 MG/ML SOLN COMPARISON:  None. FINDINGS: Multilevel degenerative disc disease is noted in lower lumbar spine. Bilateral pars defects are seen at L5. Visualized lung bases appear normal. No gallstones are noted. Fatty infiltration of the liver is noted. Simple cyst is seen in left hepatic lobe. The spleen and pancreas appear normal. Adrenal glands appear normal. No hydronephrosis or renal obstruction is noted. No renal or ureteral calculi are noted. There is no evidence of bowel obstruction. Atherosclerotic calcifications of abdominal aorta and iliac arteries are noted without aneurysm formation. The appendix appears normal. No abnormal fluid collection is noted. Large amount of stool is noted in the distal sigmoid colon and rectum consistent with impaction. Moderate urinary bladder distention is noted. 1 cm enhancing nodule is noted inferiorly within the urinary bladder; this may simply represent extension of enlarged prostate gland, but possible neoplasm cannot be excluded. No significant adenopathy is noted. Mild bilateral fat containing inguinal hernias are noted. IMPRESSION: Moderate urinary bladder distention is noted concerning for bladder outlet obstruction. Mild prostate  hypertrophy is noted. There is noted a 1 cm enhancing nodule inferiorly within the urinary bladder which may simply represent extension of enlarged prostate gland, but small neoplasm or malignancy cannot be excluded. Cystoscopy is recommended for further evaluation. Large amount of stool seen in distal sigmoid colon and rectum concerning for impaction. Mild bilateral fat containing inguinal hernias. Fatty infiltration of the liver. Electronically Signed   By: Marijo Conception, M.D.   On: 10/10/2014 21:45     Assessment & Plan:  Plan I am having Mr. [name] start on donepezil. I am also having him maintain his aspirin, polyethylene glycol, atorvastatin, finasteride, hydrochlorothiazide, and levothyroxine.  Meds ordered this encounter  Medications  . donepezil (ARICEPT) 23 MG TABS tablet    Sig: Take 1 tablet (23 mg total) by mouth at bedtime.    Dispense:  30 tablet    Refill:  2    Problem List Items Addressed This Visit      Unprioritized   Essential hypertension    Stable con't proscar and hctz      Hyperlipidemia    Pt is off lipitor Check labs       Relevant Orders   Lipid panel (Completed)  Comprehensive metabolic panel (Completed)   Hypothyroidism    On synthroid      Relevant Orders   TSH (Completed)   Memory loss - Primary    Increase aricept to 22 mg daily Pt needs to f/u to neuro      Relevant Medications   donepezil (ARICEPT) 23 MG TABS tablet   Other Relevant Orders   TSH (Completed)      Follow-up: Return in about 6 months (around 07/27/2016) for hypertension, hyperlipidemia.  Ann Held, DO

## 2016-01-25 NOTE — Assessment & Plan Note (Signed)
Increase aricept to 22 mg daily Pt needs to f/u to neuro

## 2016-01-25 NOTE — Progress Notes (Signed)
Pre visit review using our clinic review tool, if applicable. No additional management support is needed unless otherwise documented below in the visit note. 

## 2016-01-25 NOTE — Assessment & Plan Note (Signed)
On synthroid

## 2016-01-25 NOTE — Assessment & Plan Note (Signed)
Pt is off lipitor Check labs

## 2016-01-25 NOTE — Patient Instructions (Signed)

## 2016-01-25 NOTE — Assessment & Plan Note (Signed)
Stable con't proscar and hctz

## 2016-01-31 ENCOUNTER — Telehealth: Payer: Self-pay

## 2016-01-31 NOTE — Telephone Encounter (Signed)
Please advise      KP 

## 2016-01-31 NOTE — Telephone Encounter (Signed)
Patients wife called and stated the pharmacy said the new dose of donepezil is $ 111 for 23 mg so she would like to get the 10 mg for 90 for 2 a day for $12. Will you change the script   Uses [facility] on [address]  [phone]

## 2016-02-01 MED ORDER — DONEPEZIL HCL 10 MG PO TABS: 20.0000 mg | ORAL_TABLET | Freq: Every day | ORAL | 1 refills | Status: DC

## 2016-02-01 NOTE — Telephone Encounter (Signed)
VM left making Louis Patel aware the medication has been sent to the pharmacy.     KP

## 2016-02-01 NOTE — Telephone Encounter (Signed)
Please make note-- his wife is deceased--- it was probably his daughter  Madaline Brilliant to change to 2 of 10 mg #180   2 po qd

## 2016-02-21 ENCOUNTER — Encounter: Payer: Self-pay | Admitting: Neurology

## 2016-02-21 ENCOUNTER — Other Ambulatory Visit (INDEPENDENT_AMBULATORY_CARE_PROVIDER_SITE_OTHER): Payer: Medicare Other

## 2016-02-21 ENCOUNTER — Ambulatory Visit (INDEPENDENT_AMBULATORY_CARE_PROVIDER_SITE_OTHER): Payer: Medicare Other | Admitting: Neurology

## 2016-02-21 VITALS — BP 130/72 | HR 94 | Temp 98.1°F | Ht 66.0 in | Wt 211.1 lb

## 2016-02-21 DIAGNOSIS — R413 Other amnesia: Secondary | ICD-10-CM

## 2016-02-21 DIAGNOSIS — F0390 Unspecified dementia without behavioral disturbance: Secondary | ICD-10-CM

## 2016-02-21 DIAGNOSIS — F03A0 Unspecified dementia, mild, without behavioral disturbance, psychotic disturbance, mood disturbance, and anxiety: Secondary | ICD-10-CM

## 2016-02-21 LAB — VITAMIN B12: Vitamin B-12: 391 pg/mL (ref 211–911)

## 2016-02-21 MED ORDER — ESCITALOPRAM OXALATE 10 MG PO TABS
10.0000 mg | ORAL_TABLET | Freq: Every day | ORAL | 6 refills | Status: DC
Start: 2016-02-21 — End: 2017-05-09

## 2016-02-21 NOTE — Progress Notes (Signed)
NEUROLOGY CONSULTATION NOTE  Louis Patel MRN: [id number] DOB: [birth year]  Referring provider: Dr. Garnet Koyanagi Primary care provider: Dr. Garnet Koyanagi  Reason for consult:  Memory loss  Dear Dr Etter Sjogren:  Thank you for your kind referral of Louis Patel for consultation of the above symptoms. Although his history is well known to you, please allow me to reiterate it for the purpose of our medical record. The patient was accompanied to the clinic by his daughter who also provides collateral information. Records and images were personally reviewed where available.  HISTORY OF PRESENT ILLNESS: This is an 82 year old right-handed man with a history of hypertension, hyperlipidemia, hypothyroidism, dementia, presenting for worsening memory. His daughter started noticing changes in 2012 and had seen neurologist Dr. Krista Blue in 2013. At that time, MMSE was 26/30. I personally reviewed MRI brain done 02/2011 which did not show any acute changes, there was mild diffuse atrophy and chronic microvascular disease. He was started on Aricept. His daughter reports that they moved him from his house to an independent living facility this year because he was not taking his medications. Since the move, she feels he has worsened, both short-term and long-term memory have been affected, he repeats himself, he does not like to take a shower or change in underwear, family has to make him shower twice a week. His daughter reports "he does not want to try." He used to be good in folding laundry from the dryer, but started forgetting, so now his daughter does laundry for him. He has a caregiver administering medications for him. Meals are provided by the facility, as well as cleaning staff. He stopped driving a few months ago and denied getting lost previously. His daughter has also noticed some gait changes, he appears to be shuffling in the past year. No falls. Aricept dose was increased to 2 tablets daily, no side effects  on medications.  He denies any headaches, dizziness, diplopia, dysarthria, dysphagia, neck/back pain, focal numbness/tingling/weakness, bowel/bladder dysfunction. No anosmia, tremors. He denies any significant head injuries or alcohol intake. His 2 brothers and sister had dementia.   Laboratory Data: Lab Results  Component Value Date   WBC 10.2 11/08/2014   HGB 15.9 11/08/2014   HCT 47.2 11/08/2014   MCV 85.0 11/08/2014   PLT 229.0 11/08/2014     Chemistry      Component Value Date/Time   NA 138 01/25/2016 0857   K 3.7 01/25/2016 0857   CL 99 01/25/2016 0857   CO2 31 01/25/2016 0857   BUN 24 (H) 01/25/2016 0857   CREATININE 1.09 01/25/2016 0857      Component Value Date/Time   CALCIUM 9.5 01/25/2016 0857   ALKPHOS 64 01/25/2016 0857   AST 18 01/25/2016 0857   ALT 24 01/25/2016 0857   BILITOT 0.9 01/25/2016 0857     Lab Results  Component Value Date   TSH 2.63 01/25/2016    PAST MEDICAL HISTORY: Past Medical History:  Diagnosis Date  . Colon polyps   . Dementia   . Diverticulosis   . Enlarged prostate   . Hyperlipidemia   . Hypertension   . Hypothyroidism     PAST SURGICAL HISTORY: Past Surgical History:  Procedure Laterality Date  . CATARACT EXTRACTION Left 07/18/08   left cataract extraction  . CATARACT EXTRACTION Right   . COLONOSCOPY    . PROSTATE SURGERY  3/08   Prostatectomy  . TONSILLECTOMY      MEDICATIONS: Current Outpatient Prescriptions on  File Prior to Visit  Medication Sig Dispense Refill  . aspirin 81 MG EC tablet Take 81 mg by mouth daily.      Marland Kitchen donepezil (ARICEPT) 10 MG tablet Take 2 tablets (20 mg total) by mouth daily. 180 tablet 1  . finasteride (PROSCAR) 5 MG tablet Take 1 tablet (5 mg total) by mouth as directed.    . hydrochlorothiazide (HYDRODIURIL) 25 MG tablet TAKE 1 TABLET BY MOUTH DAILY 90 tablet 1  . levothyroxine (SYNTHROID, LEVOTHROID) 50 MCG tablet TAKE 1 TABLET BY MOUTH EVERY DAY 90 tablet 3   No current  facility-administered medications on file prior to visit.     ALLERGIES: No Known Allergies  FAMILY HISTORY: Family History  Problem Relation Age of Onset  . Stroke Mother   . Hypertension Mother   . Esophageal cancer Brother   . Heart disease      SOCIAL HISTORY: Social History   Social History  . Marital status: Widowed    Spouse name: N/A  . Number of children: 3  . Years of education: N/A   Occupational History  . retired    Social History Main Topics  . Smoking status: Former Smoker    Packs/day: 1.00    Years: 20.00    Types: Cigarettes    Quit date: 08/24/1962  . Smokeless tobacco: Never Used  . Alcohol use 0.0 oz/week     Comment: one per day  . Drug use: No  . Sexual activity: Not Currently    Partners: Female   Other Topics Concern  . Not on file   Social History Narrative  . No narrative on file    REVIEW OF SYSTEMS: Constitutional: No fevers, chills, or sweats, no generalized fatigue, change in appetite Eyes: No visual changes, double vision, eye pain Ear, nose and throat: No hearing loss, ear pain, nasal congestion, sore throat Cardiovascular: No chest pain, palpitations Respiratory:  No shortness of breath at rest or with exertion, wheezes GastrointestinaI: No nausea, vomiting, diarrhea, abdominal pain, fecal incontinence Genitourinary:  No dysuria, urinary retention or frequency Musculoskeletal:  No neck pain, back pain Integumentary: No rash, pruritus, skin lesions Neurological: as above Psychiatric: No depression, insomnia, anxiety Endocrine: No palpitations, fatigue, diaphoresis, mood swings, change in appetite, change in weight, increased thirst Hematologic/Lymphatic:  No anemia, purpura, petechiae. Allergic/Immunologic: no itchy/runny eyes, nasal congestion, recent allergic reactions, rashes  PHYSICAL EXAM: Vitals:   02/21/16 1321  BP: 130/72  Pulse: 94  Temp: 98.1 F (36.7 C)   General: No acute distress Head:   Normocephalic/atraumatic Eyes: Fundoscopic exam shows bilateral sharp discs, no vessel changes, exudates, or hemorrhages Neck: supple, no paraspinal tenderness, full range of motion Back: No paraspinal tenderness Heart: regular rate and rhythm Lungs: Clear to auscultation bilaterally. Vascular: No carotid bruits. Skin/Extremities: No rash, no edema Neurological Exam: Mental status: alert and oriented to person, state, day of week. He said we are in [facility], did not know date/year, season is Spring (it is summer).  No dysarthria or aphasia, Fund of knowledge is appropriate.  Remote memory intact.  Attention and concentration are normal.    Able to name objects and repeat phrases. CDT 5/5 MMSE - Mini Mental State Exam 02/22/2016  Orientation to time 1  Orientation to Place 3  Registration 3  Attention/ Calculation 5  Recall 0  Language- name 2 objects 2  Language- repeat 1  Language- follow 3 step command 3  Language- read & follow direction 1  Write a sentence  1  Copy design 1  Total score 21   Cranial nerves: CN I: not tested CN II: pupils equal, round and reactive to light, visual fields intact, fundi unremarkable. CN III, IV, VI:  full range of motion, no nystagmus, no ptosis CN V: facial sensation intact CN VII: upper and lower face symmetric CN VIII: hearing intact to finger rub CN IX, X: gag intact, uvula midline CN XI: sternocleidomastoid and trapezius muscles intact CN XII: tongue midline Bulk & Tone: normal, no cogwheeling, no fasciculations. Motor: 5/5 throughout with no pronator drift. Sensation: intact to light touch, cold, pin, vibration and joint position sense.  No extinction to double simultaneous stimulation.  Romberg test negative Deep Tendon Reflexes: +1 throughout except for absent ankle jerks bilaterally, no ankle clonus Plantar responses: downgoing bilaterally Cerebellar: no incoordination on finger to nose testing Gait: slow and cautious with good arm  swing, mild difficulty with tandem walk but able Tremor: none  IMPRESSION: This is an 82 year old right-handed man with a history of hypertension, hyperlipidemia, hypothyroidism, and dementia, presenting with worsening memory and report of shuffling gait. His neurological exam is normal, no evidence of Parkinsonism on exam, MMSE 21/30 (26/30 in 2013), indicating mild dementia, likely Alzheimer type. He is on Aricept 20mg /day, continue current medication. Check B12 level. The worsening memory appears to have started since moving to the independent living facility. He does not want to participate in their activities. We discussed effects of mood on memory, and his daughter agrees that depression may be playing a role. They are agreeable to starting low dose Lexapro 10mg /day, side effects were discussed. We discussed the importance of control of vascular risk factors, physical exercise, and brain stimulation exercises for brain health. He will follow-up in 3 months and knows to call for any changes.   Thank you for allowing me to participate in the care of this patient. Please do not hesitate to call for any questions or concerns.   Ellouise Newer, M.D.  CC: Dr. Etter Sjogren

## 2016-02-21 NOTE — Patient Instructions (Addendum)
1. Bloodwork for B12 level 2. Continue Aricept 3. Start Lexapro 10mg  daily 4. Physical exercise and brain stimulation exercises are important for brain health 5. Follow-up in 3 months, call for any changes

## 2016-02-22 ENCOUNTER — Encounter: Payer: Self-pay | Admitting: Neurology

## 2016-02-23 ENCOUNTER — Encounter: Payer: Self-pay | Admitting: *Deleted

## 2016-03-12 ENCOUNTER — Other Ambulatory Visit: Payer: Self-pay | Admitting: Family Medicine

## 2016-03-29 ENCOUNTER — Telehealth: Payer: Self-pay | Admitting: Family Medicine

## 2016-03-29 NOTE — Telephone Encounter (Signed)
No charge. 

## 2016-03-29 NOTE — Telephone Encounter (Signed)
Daughter stated she lvm 01/17/16 cancelling patient appointment for  01/18/2016 and patient received a $50 no show fee. Daughter requesting 66 no show fee waived. Please advise

## 2016-04-09 ENCOUNTER — Ambulatory Visit (INDEPENDENT_AMBULATORY_CARE_PROVIDER_SITE_OTHER): Payer: Medicare Other | Admitting: Family Medicine

## 2016-04-09 ENCOUNTER — Encounter: Payer: Self-pay | Admitting: Family Medicine

## 2016-04-09 VITALS — BP 133/84 | HR 59 | Temp 99.0°F | Ht 66.0 in | Wt 206.2 lb

## 2016-04-09 DIAGNOSIS — R05 Cough: Secondary | ICD-10-CM | POA: Diagnosis not present

## 2016-04-09 DIAGNOSIS — Z23 Encounter for immunization: Secondary | ICD-10-CM

## 2016-04-09 DIAGNOSIS — R059 Cough, unspecified: Secondary | ICD-10-CM

## 2016-04-09 DIAGNOSIS — J301 Allergic rhinitis due to pollen: Secondary | ICD-10-CM | POA: Diagnosis not present

## 2016-04-09 MED ORDER — LORATADINE 10 MG PO TABS: 10.0000 mg | ORAL_TABLET | Freq: Every day | ORAL | 11 refills | Status: DC

## 2016-04-09 MED ORDER — FLUTICASONE PROPIONATE 50 MCG/ACT NA SUSP: 2.0000 | Freq: Every day | NASAL | 6 refills | Status: DC

## 2016-04-09 NOTE — Patient Instructions (Signed)

## 2016-04-09 NOTE — Progress Notes (Signed)
Pre visit review using our clinic review tool, if applicable. No additional management support is needed unless otherwise documented below in the visit note. 

## 2016-04-09 NOTE — Progress Notes (Signed)
Patient ID: Louis Patel, male    DOB: 12/06/1925  Age: 82 y.o. MRN: [id number]    Subjective:  Subjective  HPI Louis Patel presents for cough , congestion x few weeks -- he has also been moaning a lot.  Pt denies and sob, cp , fever , chills.     Review of Systems  Constitutional: Negative for chills and fever.  HENT: Positive for congestion. Negative for postnasal drip, rhinorrhea and sinus pressure.   Respiratory: Positive for cough, chest tightness, shortness of breath and wheezing.   Cardiovascular: Negative for chest pain, palpitations and leg swelling.  Allergic/Immunologic: Negative for environmental allergies.    History Past Medical History:  Diagnosis Date  . Colon polyps   . Dementia   . Diverticulosis   . Enlarged prostate   . Hyperlipidemia   . Hypertension   . Hypothyroidism     He has a past surgical history that includes Cataract extraction (Left, 07/18/08); ostate surgery (3/08); Cataract extraction (Right); Tonsillectomy; and Colonoscopy.   His family history includes Dementia in his brother, brother, and sister; Esophageal cancer in his brother; Hypertension in his mother; Stroke in his mother and sister.He reports that he quit smoking about 53 years ago. His smoking use included Cigarettes. He has a 20.00 pack-year smoking history. He has never used smokeless tobacco. He reports that he does not drink alcohol or use drugs.  Current Outpatient Prescriptions on File Prior to Visit  Medication Sig Dispense Refill  . aspirin 81 MG EC tablet Take 81 mg by mouth daily.      Marland Kitchen donepezil (ARICEPT) 10 MG tablet Take 2 tablets (20 mg total) by mouth daily. 180 tablet 1  . escitalopram (LEXAPRO) 10 MG tablet Take 1 tablet (10 mg total) by mouth daily. 30 tablet 6  . finasteride (PROSCAR) 5 MG tablet Take 1 tablet (5 mg total) by mouth as directed.    . hydrochlorothiazide (HYDRODIURIL) 25 MG tablet TAKE 1 TABLET BY MOUTH DAILY 90 tablet 0  . levothyroxine  (SYNTHROID, LEVOTHROID) 50 MCG tablet TAKE 1 TABLET BY MOUTH EVERY DAY 90 tablet 3   No current facility-administered medications on file prior to visit.      Objective:  Objective  Physical Exam  Constitutional: He is oriented to person, place, and time. He appears well-developed and well-nourished.  HENT:  Right Ear: External ear normal.  Left Ear: External ear normal.  + PND + errythema  Eyes: Conjunctivae are normal. Right eye exhibits no discharge. Left eye exhibits no discharge.  Cardiovascular: Normal rate, regular rhythm and normal heart sounds.   No murmur heard. Pulmonary/Chest: Effort normal and breath sounds normal. No respiratory distress. He has no wheezes. He has no rales. He exhibits no tenderness.  Musculoskeletal: He exhibits no edema.  Lymphadenopathy:    He has no cervical adenopathy.  Neurological: He is alert and oriented to person, place, and time.  Nursing note and vitals reviewed.  BP 133/84 (BP Location: Left Arm, Patient Position: Sitting, Cuff Size: Normal)   Pulse (!) 59   Temp 99 F (37.2 C) (Oral)   Ht 5\' 6"  (1.676 m)   Wt 206 lb 3.2 oz (93.5 kg)   SpO2 96%   BMI 33.28 kg/m  Wt Readings from Last 3 Encounters:  04/09/16 206 lb 3.2 oz (93.5 kg)  02/21/16 211 lb 2 oz (95.8 kg)  01/25/16 210 lb 4 oz (95.4 kg)     Lab Results  Component Value Date  WBC 10.2 11/08/2014   HGB 15.9 11/08/2014   HCT 47.2 11/08/2014   PLT 229.0 11/08/2014   GLUCOSE 108 (H) 01/25/2016   CHOL 206 (H) 01/25/2016   TRIG 121.0 01/25/2016   HDL 46.70 01/25/2016   LDLDIRECT 147.9 07/15/2011   LDLCALC 135 (H) 01/25/2016   ALT 24 01/25/2016   AST 18 01/25/2016   NA 138 01/25/2016   K 3.7 01/25/2016   CL 99 01/25/2016   CREATININE 1.09 01/25/2016   BUN 24 (H) 01/25/2016   CO2 31 01/25/2016   TSH 2.63 01/25/2016   PSA 6.92 (H) 11/08/2014    Ct Abdomen Pelvis W Contrast  Result Date: 10/10/2014 CLINICAL DATA:  Diarrhea with bloody stool. EXAM: CT ABDOMEN AND  PELVIS WITH CONTRAST TECHNIQUE: Multidetector CT imaging of the abdomen and pelvis was performed using the standard protocol following bolus administration of intravenous contrast. CONTRAST:  67mL OMNIPAQUE IOHEXOL 300 MG/ML SOLN, 113mL OMNIPAQUE IOHEXOL 300 MG/ML SOLN COMPARISON:  None. FINDINGS: Multilevel degenerative disc disease is noted in lower lumbar spine. Bilateral pars defects are seen at L5. Visualized lung bases appear normal. No gallstones are noted. Fatty infiltration of the liver is noted. Simple cyst is seen in left hepatic lobe. The spleen and pancreas appear normal. Adrenal glands appear normal. No hydronephrosis or renal obstruction is noted. No renal or ureteral calculi are noted. There is no evidence of bowel obstruction. Atherosclerotic calcifications of abdominal aorta and iliac arteries are noted without aneurysm formation. The appendix appears normal. No abnormal fluid collection is noted. Large amount of stool is noted in the distal sigmoid colon and rectum consistent with impaction. Moderate urinary bladder distention is noted. 1 cm enhancing nodule is noted inferiorly within the urinary bladder; this may simply represent extension of enlarged prostate gland, but possible neoplasm cannot be excluded. No significant adenopathy is noted. Mild bilateral fat containing inguinal hernias are noted. IMPRESSION: Moderate urinary bladder distention is noted concerning for bladder outlet obstruction. Mild prostate hypertrophy is noted. There is noted a 1 cm enhancing nodule inferiorly within the urinary bladder which may simply represent extension of enlarged prostate gland, but small neoplasm or malignancy cannot be excluded. Cystoscopy is recommended for further evaluation. Large amount of stool seen in distal sigmoid colon and rectum concerning for impaction. Mild bilateral fat containing inguinal hernias. Fatty infiltration of the liver. Electronically Signed   By: Marijo Conception, M.D.   On:  10/10/2014 21:45     Assessment & Plan:  Plan  I am having Mr. Louis Patel start on fluticasone and loratadine. I am also having him maintain his aspirin, finasteride, levothyroxine, donepezil, escitalopram, and hydrochlorothiazide.  Meds ordered this encounter  Medications  . fluticasone (FLONASE) 50 MCG/ACT nasal spray    Sig: Place 2 sprays into both nostrils daily.    Dispense:  16 g    Refill:  6  . loratadine (CLARITIN) 10 MG tablet    Sig: Take 1 tablet (10 mg total) by mouth daily.    Dispense:  30 tablet    Refill:  11    Problem List Items Addressed This Visit    None    Visit Diagnoses    Acute seasonal allergic rhinitis due to pollen    -  Primary   Relevant Medications   fluticasone (FLONASE) 50 MCG/ACT nasal spray   loratadine (CLARITIN) 10 MG tablet   Cough       Relevant Orders   DG Chest 2 View   Encounter for immunization  Relevant Orders   Flu vaccine HIGH DOSE PF (Completed)      Follow-up: No Follow-up on file.  Ann Held, DO

## 2016-04-11 ENCOUNTER — Telehealth: Payer: Self-pay | Admitting: Family Medicine

## 2016-04-11 NOTE — Telephone Encounter (Signed)
Ok to order 

## 2016-04-11 NOTE — Telephone Encounter (Signed)
[facility] called to let us know that patient is having increased shortness of breath, trouble walking, and difficulty taking care of his bathing needs. Patient's daughter Marlowe Kays) is requesting PT/OT/ST orders for the patient. Please advise.   Caller: Sarah with [facility]: [phone]

## 2016-04-11 NOTE — Telephone Encounter (Signed)
-----   Message from Katha Hamming sent at 04/11/2016  1:16 PM EDT ----- Regarding: chest I called [facility] to get his chest xray.  His daughter declined the chest xray.  Thanks, Hoyle Sauer

## 2016-04-11 NOTE — Telephone Encounter (Signed)
Please advise      KP 

## 2016-04-11 NOTE — Telephone Encounter (Signed)
Verbal left on Louis Patel's VM for PT/OT/ST.     KP

## 2016-04-12 ENCOUNTER — Telehealth: Payer: Self-pay | Admitting: Family Medicine

## 2016-04-12 NOTE — Telephone Encounter (Signed)
Caller name: Levonne Spiller Relationship to patient: [facility] Can be reached: [phone]   Reason for call: FYI: Saw patient today for evaluation. Will be seeing patient for 8 visits for strength, balancing, and gait training. Will work on getting patient a walker and a bedside commode.

## 2016-04-15 ENCOUNTER — Emergency Department (HOSPITAL_COMMUNITY): Payer: Medicare Other

## 2016-04-15 ENCOUNTER — Emergency Department (HOSPITAL_COMMUNITY)
Admission: EM | Admit: 2016-04-15 | Discharge: 2016-04-15 | Disposition: A | Discharge status: Home / Self Care | Payer: Medicare Other | Attending: Emergency Medicine | Admitting: Emergency Medicine

## 2016-04-15 ENCOUNTER — Encounter (HOSPITAL_COMMUNITY): Payer: Self-pay | Admitting: Emergency Medicine

## 2016-04-15 DIAGNOSIS — Z87891 Personal history of nicotine dependence: Secondary | ICD-10-CM | POA: Diagnosis not present

## 2016-04-15 DIAGNOSIS — M545 Low back pain, unspecified: Secondary | ICD-10-CM

## 2016-04-15 DIAGNOSIS — R531 Weakness: Secondary | ICD-10-CM

## 2016-04-15 DIAGNOSIS — M6250 Muscle wasting and atrophy, not elsewhere classified, unspecified site: Secondary | ICD-10-CM | POA: Diagnosis not present

## 2016-04-15 DIAGNOSIS — Z7951 Long term (current) use of inhaled steroids: Secondary | ICD-10-CM | POA: Insufficient documentation

## 2016-04-15 DIAGNOSIS — Z7982 Long term (current) use of aspirin: Secondary | ICD-10-CM | POA: Insufficient documentation

## 2016-04-15 DIAGNOSIS — E039 Hypothyroidism, unspecified: Secondary | ICD-10-CM | POA: Insufficient documentation

## 2016-04-15 DIAGNOSIS — F0390 Unspecified dementia without behavioral disturbance: Secondary | ICD-10-CM | POA: Diagnosis not present

## 2016-04-15 DIAGNOSIS — Z79899 Other long term (current) drug therapy: Secondary | ICD-10-CM | POA: Diagnosis not present

## 2016-04-15 DIAGNOSIS — R29898 Other symptoms and signs involving the musculoskeletal system: Secondary | ICD-10-CM

## 2016-04-15 DIAGNOSIS — I10 Essential (primary) hypertension: Secondary | ICD-10-CM | POA: Insufficient documentation

## 2016-04-15 LAB — URINALYSIS, ROUTINE W REFLEX MICROSCOPIC
Bilirubin Urine: NEGATIVE
Glucose, UA: NEGATIVE mg/dL
Hgb urine dipstick: NEGATIVE
Ketones, ur: NEGATIVE mg/dL
Leukocytes, UA: NEGATIVE
Nitrite: NEGATIVE
Protein, ur: NEGATIVE mg/dL
Specific Gravity, Urine: 1.021 (ref 1.005–1.030)
pH: 7 (ref 5.0–8.0)

## 2016-04-15 LAB — CBC
HCT: 42.9 % (ref 39.0–52.0)
Hemoglobin: 14.9 g/dL (ref 13.0–17.0)
MCH: 28.5 pg (ref 26.0–34.0)
MCHC: 34.7 g/dL (ref 30.0–36.0)
MCV: 82.2 fL (ref 78.0–100.0)
Platelets: 236 10*3/uL (ref 150–400)
RBC: 5.22 MIL/uL (ref 4.22–5.81)
RDW: 14.5 % (ref 11.5–15.5)
WBC: 6.8 10*3/uL (ref 4.0–10.5)

## 2016-04-15 LAB — BASIC METABOLIC PANEL
Anion gap: 6 (ref 5–15)
BUN: 23 mg/dL — ABNORMAL HIGH (ref 6–20)
CO2: 27 mmol/L (ref 22–32)
Calcium: 8.4 mg/dL — ABNORMAL LOW (ref 8.9–10.3)
Chloride: 100 mmol/L — ABNORMAL LOW (ref 101–111)
Creatinine, Ser: 0.91 mg/dL (ref 0.61–1.24)
GFR calc Af Amer: >60 mL/min (ref >60)
GFR calc non Af Amer: >60 mL/min (ref >60)
Glucose, Bld: 90 mg/dL (ref 65–99)
Potassium: 3.4 mmol/L — ABNORMAL LOW (ref 3.5–5.1)
Sodium: 133 mmol/L — ABNORMAL LOW (ref 135–145)

## 2016-04-15 MED ORDER — TRAMADOL HCL 50 MG PO TABS: 50.0000 mg | ORAL_TABLET | Freq: Four times a day (QID) | ORAL | 0 refills | Status: DC | PRN

## 2016-04-15 MED ORDER — SODIUM CHLORIDE 0.9 % IV BOLUS (SEPSIS): 1000.0000 mL | Freq: Once | INTRAVENOUS | Status: DC

## 2016-04-15 MED ORDER — HYDROMORPHONE HCL 1 MG/ML IJ SOLN
0.5000 mg | Freq: Once | INTRAMUSCULAR | Status: AC
Start: 2016-04-15 — End: 2016-04-15
  Administered 2016-04-15: 0.5 mg via INTRAVENOUS
  Filled 2016-04-15: qty 1

## 2016-04-15 NOTE — Progress Notes (Signed)
CSW spoke with patient regarding discharge plans. Patient is currently receiving services from [facility] and would not like to be moved from his independent living facility. Patients family members, Marlowe Kays and Austen Nakatani, requested more outpatient resources for their father. Patients family would like 24-hour sitter list at this time. CSW consulted with [facility] for resources within the home and 24-hour sitter list for family.   Kingsley Spittle, [place] Clinical Social Worker [phone]

## 2016-04-15 NOTE — ED Notes (Signed)
Incontinent care provider to Pt with barrier cream and brief.

## 2016-04-15 NOTE — ED Notes (Signed)
Pt given sandwich and water.

## 2016-04-15 NOTE — ED Notes (Signed)
Pt given milk to drink

## 2016-04-15 NOTE — ED Notes (Signed)
Social worker has been to speak with patient and family member.

## 2016-04-15 NOTE — ED Notes (Signed)
Patient transported to MRI 

## 2016-04-15 NOTE — ED Notes (Signed)
Patient transported to X-ray 

## 2016-04-15 NOTE — Progress Notes (Signed)
ED CM consulted by ED NT - sent msg to ED SW ED SW saw ED CM after speaking with pt and family.  States pt and family wanting [place] SW and a private duty nursing (PDN) list ED CM consulted Bethena Roys of [facility] to confirmed pt lives in independent facility in [place] on [address] Confirms it is a separate Nurse, learning disability office for his SN and PT services that was started on 04/12/16  Discussed pt/family wanting additional HHSW and PDN sitter- Edwina to assist with referral for additional [place] and request Cm to have them to call the listed [facility]  Entered HHSW in [facility] (no face to face needed since completed 04/12/16)  CM discussed the addition of HHSW with pt/family and provided a list of PDNs and discussed that [facility] does have PDN and [place] services available Discussed they have their choice of PDN from the PDN list to include [facility] PDN contact information  Pt/family appreciative of resources offered Teach back method used and family able to state that [facility] was added, pending a HHSW call and they have their choice of PDN sitter services to get pt started for the day

## 2016-04-15 NOTE — ED Triage Notes (Signed)
Per EMS, Pt from [facility] assisted [facility] in [place], per son, pt couldn't get out of bed this morning. Pt c/o generalized weakness x 1 week. Pt seen at PCP last Tuesday for the same thing. Three days ago pt had a fall with no injuries and was not seen. Pt's went from 58 to 104 orthostatically.

## 2016-04-15 NOTE — ED Provider Notes (Signed)
[facility] DEPT Provider Note   CSN: [id number] Arrival date & time: 04/15/16  1000  By signing my name below, I, Louis Patel, attest that this documentation has been prepared under the direction and in the presence of Virgel Manifold, MD. Electronically Signed: Reola Patel, ED Scribe. 04/15/16. 10:39 AM.  History   Chief Complaint Chief Complaint  Patient presents with  . Weakness   LEVEL 5 CAVEAT: HPI and ROS limited due to dementia  The history is provided by the patient, the EMS personnel and a relative. History limited by: dementia. No language interpreter was used.    HPI Comments: Louis Patel is a 82 y.o. male BIB EMS from [facility] independent [facility], with a PMHx of dementia and hypothyroidism, who presents to the Emergency Department complaining of diffuse bodily weakness onset ~1 week ago, worsening this morning PTA. Relatives report that the facility nurses who work with him have noticed increased SOB with mild exertion over the past several days secondary to his weakness. Family states that he currently lives in an independent living facility, and when a technician came in this morning reported that he was unable to get out of his bed. Per family, pt sustained a fall ~2 days ago and since then he has been moderately more confused from baseline. They additionally report that he had been complaining of increased back pain this morning; however, pt states that he cannot remember if this is an issue. They further note that he had been c/o back pain over the past several weeks prior to this fall as well. Pt did have a influenza immunization ~6 days ago. Pt does not ambulate with the assistance of a walking device; however, relatives notes that since his fall ~2 days ago his mobility has been declining. His last dx'd UTI was ~4 months ago. Denies urgency, frequency, hematuria, dysuria, difficulty urinating, or any other known associated symptoms.   Past Medical  History:  Diagnosis Date  . Colon polyps   . Dementia   . Diverticulosis   . Enlarged prostate   . Hyperlipidemia   . Hypertension   . Hypothyroidism    Patient Active Problem List   Diagnosis Date Noted  . Hypothyroidism 01/25/2016  . UTI (urinary tract infection) 06/19/2015  . Obesity (BMI 30-39.9) 02/22/2013  . Memory loss 01/22/2011  . MOLE 05/22/2010  . DEPRESSIVE DISORDER 05/22/2010  . ALLERGIC RHINITIS CAUSE UNSPECIFIED 10/17/2008  . PSA, INCREASED 05/23/2008  . Hyperlipidemia 02/26/2007  . Essential hypertension 02/26/2007  . HEMORRHOIDS, EXTERNAL 02/26/2007   Past Surgical History:  Procedure Laterality Date  . CATARACT EXTRACTION Left 07/18/08   left cataract extraction  . CATARACT EXTRACTION Right   . COLONOSCOPY    . PROSTATE SURGERY  3/08   Prostatectomy  . TONSILLECTOMY      Home Medications    Prior to Admission medications   Medication Sig Start Date End Date Taking? Authorizing Provider  aspirin 81 MG EC tablet Take 81 mg by mouth daily.     Yes Historical Provider, MD  donepezil (ARICEPT) 10 MG tablet Take 2 tablets (20 mg total) by mouth daily. 02/01/16  Yes Yvonne R Lowne Chase, DO  escitalopram (LEXAPRO) 10 MG tablet Take 1 tablet (10 mg total) by mouth daily. 02/21/16  Yes Cameron Sprang, MD  finasteride (PROSCAR) 5 MG tablet Take 1 tablet (5 mg total) by mouth as directed. Patient taking differently: Take 5 mg by mouth daily.  07/13/15  Yes Rosalita Chessman  Chase, DO  hydrochlorothiazide (HYDRODIURIL) 25 MG tablet TAKE 1 TABLET BY MOUTH DAILY 03/12/16  Yes Yvonne R Lowne Chase, DO  levothyroxine (SYNTHROID, LEVOTHROID) 50 MCG tablet TAKE 1 TABLET BY MOUTH EVERY DAY 10/04/15  Yes Yvonne R Lowne Chase, DO  loratadine (CLARITIN) 10 MG tablet Take 1 tablet (10 mg total) by mouth daily. 04/09/16  Yes Yvonne R Lowne Chase, DO  fluticasone (FLONASE) 50 MCG/ACT nasal spray Place 2 sprays into both nostrils daily. 04/09/16   Ann Held, DO   Family  History Family History  Problem Relation Age of Onset  . Stroke Mother   . Hypertension Mother   . Esophageal cancer Brother   . Heart disease    . Stroke Sister   . Dementia Brother   . Dementia Brother   . Dementia Sister    Social History Social History  Substance Use Topics  . Smoking status: Former Smoker    Packs/day: 1.00    Years: 20.00    Types: Cigarettes    Quit date: 08/24/1962  . Smokeless tobacco: Never Used  . Alcohol use No   Allergies   Review of patient's allergies indicates no known allergies.  Review of Systems Review of Systems  Unable to perform ROS: Dementia  Respiratory: Positive for shortness of breath (w/ exertion).   Genitourinary: Negative for difficulty urinating, dysuria, frequency, hematuria and urgency.  Musculoskeletal: Positive for back pain.  Neurological: Positive for weakness (diffuse).  Psychiatric/Behavioral: Positive for confusion.   Physical Exam Updated Vital Signs BP 99/68 (BP Location: Left Arm)   Pulse (!) 52   Temp 97.9 F (36.6 C) (Oral)   Resp 18   SpO2 98%   Physical Exam  Constitutional: He appears well-developed and well-nourished.  Pleasantly confused.  HENT:  Head: Normocephalic and atraumatic.  Mouth/Throat: Oropharynx is clear and moist. No oropharyngeal exudate.  Eyes: Conjunctivae and EOM are normal. Pupils are equal, round, and reactive to light. Right eye exhibits no discharge. Left eye exhibits no discharge. No scleral icterus.  Neck: Normal range of motion. Neck supple. No JVD present. No thyromegaly present.  Cardiovascular: Regular rhythm, normal heart sounds and intact distal pulses.  Bradycardia present.  Exam reveals no gallop and no friction rub.   No murmur heard.  Mildly bradycardic.   Pulmonary/Chest: Effort normal and breath sounds normal. No respiratory distress. He has no wheezes. He has no rales.  Abdominal: Soft. Bowel sounds are normal. He exhibits no distension and no mass. There is no  tenderness.  Musculoskeletal: He exhibits tenderness.  Lower thoracic and upper lumbar tenderness to palpation.   Lymphadenopathy:    He has no cervical adenopathy.  Neurological: He is alert. Coordination normal.  Skin: Skin is warm and dry. No rash noted. No erythema.  Psychiatric: He has a normal mood and affect. His behavior is normal.  Nursing note and vitals reviewed.  ED Treatments / Results  DIAGNOSTIC STUDIES: Oxygen Saturation is 98% on RA, normal by my interpretation.   COORDINATION OF CARE: 10:32 AM-Discussed next steps with pt and family. Pt and family verbalized understanding and is agreeable with the plan.   Labs (all labs ordered are listed, but only abnormal results are displayed) Labs Reviewed  BASIC METABOLIC PANEL - Abnormal; Notable for the following:       Result Value   Sodium 133 (*)    Potassium 3.4 (*)    Chloride 100 (*)    BUN 23 (*)    Calcium 8.4 (*)  All other components within normal limits  URINALYSIS, ROUTINE W REFLEX MICROSCOPIC (NOT AT [facility]) - Abnormal; Notable for the following:    APPearance CLOUDY (*)    All other components within normal limits  CBC   EKG  EKG Interpretation None      Radiology Dg Chest 1 View  Result Date: 04/15/2016 CLINICAL DATA:  Recent falls. EXAM: CHEST 1 VIEW COMPARISON:  September 10, 2006 FINDINGS: There is no edema or consolidation. Heart size and pulmonary vascularity are normal. No adenopathy. No pneumothorax. No bone lesions. IMPRESSION: No edema or consolidation.  No evident pneumothorax. Electronically Signed   By: Lowella Grip III M.D.   On: 04/15/2016 12:02   Dg Lumbar Spine Complete  Result Date: 04/15/2016 CLINICAL DATA:  Fall on Saturday, low back pain EXAM: LUMBAR SPINE - COMPLETE 4+ VIEW COMPARISON:  None. FINDINGS: Degenerative disc disease changes throughout the lumbar spine. Probable bilateral L5 pars defects. Degenerative facet disease. 11 mm anterolisthesis of L5 on S1. No fracture.  SI joints are symmetric and unremarkable. IMPRESSION: Diffuse degenerative disc disease. Degenerative facet disease at the lumbosacral junction with probable L5 pars defects. Grade 1 anterolisthesis of L5 on S1. No acute findings. Electronically Signed   By: Rolm Baptise M.D.   On: 04/15/2016 12:00   Procedures Procedures   Medications Ordered in ED Medications - No data to display  Initial Impression / Assessment and Plan / ED Course  I have reviewed the triage vital signs and the nursing notes.  Pertinent labs & imaging results that were available during my care of the patient were reviewed by me and considered in my medical decision making (see chart for details).  Clinical Course   81 year old male with lower back pain. Unclear S exact etiology. Patient likely needs a higher level of care. Social worker to speak with family. Does not appear to be acute emergent pathology.  Final Clinical Impressions(s) / ED Diagnoses   Final diagnoses:  Lower back pain   New Prescriptions New Prescriptions   No medications on file   I personally preformed the services scribed in my presence. The recorded information has been reviewed is accurate. Virgel Manifold, MD.     Virgel Manifold, MD 04/17/16 [phone]

## 2016-04-16 ENCOUNTER — Telehealth: Payer: Self-pay | Admitting: Family Medicine

## 2016-04-16 NOTE — Telephone Encounter (Signed)
Ok to give order for rollator walker and bedside commode for recent fall, hi fall risk, back pain

## 2016-04-16 NOTE — Telephone Encounter (Signed)
Spoke with Louis Patel and the patient fell over the weekend. EMS checked him out and he was ok, he got weak the following day and went to the ED and was given Tramadol for the back pain. She said when she went to the home, she noticed his chair was low, so he was not getting up and moving around as he normally work, so the joint had stiffened. She said they will make adjustments to his chair at home, but also wants to get an order for a Bedside commode and a Rollator walker. Please advise     KP

## 2016-04-16 NOTE — Telephone Encounter (Signed)
Caller name:[facility] Relation to KQ:[id number] giver Call back number:[phone] Pharmacy:  Reason for call: pt fell on yesterday and Louis Patel would like to speak with you directly states she has some questions regarding the care of the pt

## 2016-04-18 NOTE — Telephone Encounter (Signed)
Caller name: Levonne Spiller, PT with [facility] Can be reached: [phone] Fax: [phone]  Reason for call: Please fax orders for rollator walker and bedside commode. Please mark Attn: Levonne Spiller.

## 2016-04-19 MED ORDER — COMMODE BEDSIDE MISC: 0 refills | Status: DC

## 2016-04-19 MED ORDER — ROLLATOR ULTRA-LIGHT MISC: 0 refills | Status: DC

## 2016-04-19 NOTE — Telephone Encounter (Signed)
Rx faxed.    KP 

## 2016-04-22 ENCOUNTER — Telehealth: Payer: Self-pay | Admitting: Family Medicine

## 2016-04-22 NOTE — Telephone Encounter (Signed)
Caller name: Wilfred Lacy Relationship to patient: [facility] Can be reached: [phone]   Reason for call: Patient was evaluated this morning. Request orders for Home Health Nursing orders/Aid. Also need pain management. Plse adv

## 2016-04-22 NOTE — Telephone Encounter (Signed)
Verbal given, she said the patient is staging and the family is concerned. The want to make him comfortable.   KP

## 2016-04-29 NOTE — Telephone Encounter (Signed)
Agree 

## 2016-04-30 ENCOUNTER — Telehealth: Payer: Self-pay | Admitting: Family Medicine

## 2016-04-30 NOTE — Telephone Encounter (Signed)
Caller name: Levonne Spiller with [facility] Can be reached: [phone] Fax: [phone]  Reason for call: Please send written rx/order for bedside commode to help with transitions to the toilet and help with pain control of having to raise/lower pt.

## 2016-04-30 NOTE — Telephone Encounter (Signed)
Ok to send

## 2016-04-30 NOTE — Telephone Encounter (Signed)
Please advise      KP 

## 2016-05-01 MED ORDER — COMMODE BEDSIDE MISC: 0 refills | Status: DC

## 2016-05-01 NOTE — Telephone Encounter (Signed)
Order faxed to [facility] is aware.    KP

## 2016-05-03 ENCOUNTER — Telehealth: Payer: Self-pay | Admitting: Family Medicine

## 2016-05-03 DIAGNOSIS — I10 Essential (primary) hypertension: Secondary | ICD-10-CM | POA: Diagnosis not present

## 2016-05-03 DIAGNOSIS — F0280 Dementia in other diseases classified elsewhere without behavioral disturbance: Secondary | ICD-10-CM | POA: Diagnosis not present

## 2016-05-03 DIAGNOSIS — E785 Hyperlipidemia, unspecified: Secondary | ICD-10-CM | POA: Diagnosis not present

## 2016-05-03 DIAGNOSIS — Z6833 Body mass index (BMI) 33.0-33.9, adult: Secondary | ICD-10-CM | POA: Diagnosis not present

## 2016-05-03 DIAGNOSIS — N400 Enlarged prostate without lower urinary tract symptoms: Secondary | ICD-10-CM | POA: Diagnosis not present

## 2016-05-03 DIAGNOSIS — Z87891 Personal history of nicotine dependence: Secondary | ICD-10-CM | POA: Diagnosis not present

## 2016-05-03 DIAGNOSIS — J301 Allergic rhinitis due to pollen: Secondary | ICD-10-CM | POA: Diagnosis not present

## 2016-05-03 DIAGNOSIS — Z87440 Personal history of urinary (tract) infections: Secondary | ICD-10-CM | POA: Diagnosis not present

## 2016-05-03 DIAGNOSIS — E039 Hypothyroidism, unspecified: Secondary | ICD-10-CM | POA: Diagnosis not present

## 2016-05-03 DIAGNOSIS — E669 Obesity, unspecified: Secondary | ICD-10-CM | POA: Diagnosis not present

## 2016-05-03 DIAGNOSIS — M5136 Other intervertebral disc degeneration, lumbar region: Secondary | ICD-10-CM | POA: Diagnosis not present

## 2016-05-03 DIAGNOSIS — Z7982 Long term (current) use of aspirin: Secondary | ICD-10-CM

## 2016-05-03 DIAGNOSIS — F329 Major depressive disorder, single episode, unspecified: Secondary | ICD-10-CM | POA: Diagnosis not present

## 2016-05-03 NOTE — Telephone Encounter (Signed)
Ena Dawley- Nurse [facility] [phone]    Requesting orders to work with pt for 1 week for 4 weeks for medication management.

## 2016-05-03 NOTE — Telephone Encounter (Signed)
Ok to use biofreeze

## 2016-05-03 NOTE — Telephone Encounter (Signed)
Left a message on [facility] voicemail to give the office a call back.

## 2016-05-03 NOTE — Telephone Encounter (Signed)
Please advise on the Biofreeze?    KP

## 2016-05-03 NOTE — Telephone Encounter (Signed)
Caller name:[facility] Relationship to patient: Can be reached:[phone] Pharmacy:  Reason for call:Requesting order for Social worker, ok with using biofreeze? Verbal orders will be ok

## 2016-05-06 NOTE — Telephone Encounter (Signed)
Spoke with Louis Patel from [facility] to ok verbal agreement. Per Louis Patel she will send reassessment and agreement via fax.

## 2016-05-09 NOTE — Telephone Encounter (Signed)
[facility] nurse calling to get orders to continue therapy with PT 2 week 4. Please advise.   Nurse Contact: [phone]

## 2016-05-10 NOTE — Telephone Encounter (Signed)
Spoke with rep Ena Dawley to give verbal agreement for patient to have PT for 2 week. Per Ena Dawley she will also send order by fax today. Rep had no further questions or concerns.

## 2016-05-13 ENCOUNTER — Telehealth: Payer: Self-pay | Admitting: *Deleted

## 2016-05-13 NOTE — Telephone Encounter (Signed)
Okay to give verbal orders?  ?

## 2016-05-13 NOTE — Telephone Encounter (Signed)
Ena Dawley called back to request a Home Health Aid twice a week for patient. Please add this to previous request.

## 2016-05-13 NOTE — Telephone Encounter (Signed)
Yes this is ok 

## 2016-05-13 NOTE — Telephone Encounter (Signed)
Verbal orders called as directed.  

## 2016-05-13 NOTE — Telephone Encounter (Signed)
Called patient and left message to return call

## 2016-05-13 NOTE — Telephone Encounter (Signed)
Received signed Physician orders from Dr. Etter Sjogren.  Orders faxed to [facility] received and sent for scanning.//AB/CMA

## 2016-05-15 ENCOUNTER — Telehealth: Payer: Self-pay | Admitting: Family Medicine

## 2016-05-15 NOTE — Telephone Encounter (Signed)
Caller name:[facility], PT Relationship to [facility] Can be reached:[phone] Pharmacy:  Reason for call: Requesting Order for UA, patient having some confusion, urine is dark. Pt also has a skin tear on right elbow, not sure how it happened.

## 2016-05-16 ENCOUNTER — Telehealth: Payer: Self-pay | Admitting: Family Medicine

## 2016-05-16 NOTE — Telephone Encounter (Signed)
See phone note dated 05/16/16.

## 2016-05-16 NOTE — Telephone Encounter (Signed)
error:[id number] ° °

## 2016-05-16 NOTE — Telephone Encounter (Signed)
Ok to check urine

## 2016-05-16 NOTE — Telephone Encounter (Signed)
Please advise 

## 2016-05-16 NOTE — Telephone Encounter (Signed)
Ok to give order? 

## 2016-05-16 NOTE — Telephone Encounter (Signed)
Please advise.  (Another phone note in chart regarding skin tear).

## 2016-05-16 NOTE — Telephone Encounter (Signed)
Louis Patel from [facility] called to get orders for a wound dressing. Patient has a skin tear on his right outer elbow, about 3cm x 1.5cm. She states it is not deep and does not look infected, no drainage and no odor. Stephanie cleaned it and put a dressing on it. Please advise.  Stephanie contact: [phone]

## 2016-05-16 NOTE — Telephone Encounter (Signed)
Called [facility] verbal order given to continue current wound dressing (Silvasorb gel, 2 x 2, and [employer] and recheck on Monday.  Order also given to collect urine.  Colletta Maryland said urine specimen was collected and will call office with results.

## 2016-05-28 ENCOUNTER — Encounter: Payer: Self-pay | Admitting: Neurology

## 2016-05-28 ENCOUNTER — Ambulatory Visit (INDEPENDENT_AMBULATORY_CARE_PROVIDER_SITE_OTHER): Payer: Medicare Other | Admitting: Neurology

## 2016-05-28 VITALS — BP 132/78 | HR 81 | Ht 66.0 in | Wt 200.5 lb

## 2016-05-28 DIAGNOSIS — F03A0 Unspecified dementia, mild, without behavioral disturbance, psychotic disturbance, mood disturbance, and anxiety: Secondary | ICD-10-CM

## 2016-05-28 DIAGNOSIS — F0390 Unspecified dementia without behavioral disturbance: Secondary | ICD-10-CM

## 2016-05-28 DIAGNOSIS — W19XXXA Unspecified fall, initial encounter: Secondary | ICD-10-CM | POA: Diagnosis not present

## 2016-05-28 NOTE — Progress Notes (Signed)
NEUROLOGY FOLLOW UP OFFICE NOTE  CASTON STRZELCZYK Sr [id number]  HISTORY OF PRESENT ILLNESS: I had the pleasure of seeing Sunao Fairweather in follow-up in the neurology clinic on 05/28/2016.  The patient was last seen 3 months ago and is accompanied by his daughter and son who help supplement the history today.  MMSE 21/30 in August 2017. He is taking Aricept 20mg  daily. We had discussed how mood may be contributing to his memory, Lexapro was started. His family feels he is doing pretty good with the medication, "he is fantastic." They feel his memory has gotten a lot worse. He feels his memory is not too good, he does not remember a lot of things. He tries to read the newspaper. His family reports that he fell last October and things have gone down since then. He has a visiting nurse who has recommended PT and OT. He has a home aide to come help bathe him once a week. Family visits him often. Since the fall, he has been using [employer] and has to be prompted to use the bathroom. He frequently misplaces things and cannot find his dentures. He is a lot more confused, one time he called his daughter saying his phone was not working but was able to call her. With PT and OT, he has gotten better using his phone and remote control, but at one point he got confused, pressing his LifeAlert for everything (changing TV channel). He has an aid for several hours. He has been checked for a UTI. He has a scrape on his arm that he does not recall how he got. He denies any headaches, dizziness, diplopia, dysarthria, dysphagia, neck/back pain, focal numbness/tingling/weakness.. No anosmia, tremors  HPI 02/21/2016: This is an 82 yo RH man with a history of hypertension, hyperlipidemia, hypothyroidism, dementia, with worsening memory. His daughter started noticing changes in 2012 and had seen neurologist Dr. Krista Blue in 2013. At that time, MMSE was 26/30. I personally reviewed MRI brain done 02/2011 which did not show any acute  changes, there was mild diffuse atrophy and chronic microvascular disease. He was started on Aricept. His daughter reports that they moved him from his house to an independent living facility this year because he was not taking his medications. Since the move, she feels he has worsened, both short-term and long-term memory have been affected, he repeats himself, he does not like to take a shower or change in underwear, family has to make him shower twice a week. His daughter reports "he does not want to try." He used to be good in folding laundry from the dryer, but started forgetting, so now his daughter does laundry for him. He has a caregiver administering medications for him. Meals are provided by the facility, as well as cleaning staff. He stopped driving a few months ago and denied getting lost previously. His daughter has also noticed some gait changes, he appears to be shuffling in the past year. No falls. Aricept dose was increased to 2 tablets daily, no side effects on medications. He denies any significant head injuries or alcohol intake. His 2 brothers and sister had dementia.   PAST MEDICAL HISTORY: Past Medical History:  Diagnosis Date  . Colon polyps   . Dementia   . Diverticulosis   . Enlarged prostate   . Hyperlipidemia   . Hypertension   . Hypothyroidism     MEDICATIONS: Current Outpatient Prescriptions on File Prior to Visit  Medication Sig Dispense Refill  . aspirin 81  MG EC tablet Take 81 mg by mouth daily.      Marland Kitchen donepezil (ARICEPT) 10 MG tablet Take 2 tablets (20 mg total) by mouth daily. 180 tablet 1  . escitalopram (LEXAPRO) 10 MG tablet Take 1 tablet (10 mg total) by mouth daily. 30 tablet 6  . finasteride (PROSCAR) 5 MG tablet Take 1 tablet (5 mg total) by mouth as directed. (Patient taking differently: Take 5 mg by mouth daily. )    . hydrochlorothiazide (HYDRODIURIL) 25 MG tablet TAKE 1 TABLET BY MOUTH DAILY 90 tablet 0  . levothyroxine (SYNTHROID, LEVOTHROID) 50  MCG tablet TAKE 1 TABLET BY MOUTH EVERY DAY 90 tablet 3  . loratadine (CLARITIN) 10 MG tablet Take 1 tablet (10 mg total) by mouth daily. 30 tablet 11  . Misc. Devices (COMMODE BEDSIDE) MISC 1 device to use as needed 1 each 0  . Misc. Devices (COMMODE BEDSIDE) MISC Use Device daily as needed. 1 each 0  . Misc. Devices (ROLLATOR ULTRA-LIGHT) MISC 1 Rollator walker to use and needed 1 each 0  . traMADol (ULTRAM) 50 MG tablet Take 1 tablet (50 mg total) by mouth every 6 (six) hours as needed. 15 tablet 0   No current facility-administered medications on file prior to visit.     ALLERGIES: No Known Allergies  FAMILY HISTORY: Family History  Problem Relation Age of Onset  . Stroke Mother   . Hypertension Mother   . Esophageal cancer Brother   . Heart disease    . Stroke Sister   . Dementia Brother   . Dementia Brother   . Dementia Sister     SOCIAL HISTORY: Social History   Social History  . Marital status: Widowed    Spouse name: N/A  . Number of children: 3  . Years of education: N/A   Occupational History  . retired    Social History Main Topics  . Smoking status: Former Smoker    Packs/day: 1.00    Years: 20.00    Types: Cigarettes    Quit date: 08/24/1962  . Smokeless tobacco: Never Used  . Alcohol use No  . Drug use: No  . Sexual activity: Not Currently    Partners: Female   Other Topics Concern  . Not on file   Social History Narrative  . No narrative on file    REVIEW OF SYSTEMS: Constitutional: No fevers, chills, or sweats, no generalized fatigue, change in appetite Eyes: No visual changes, double vision, eye pain Ear, nose and throat: No hearing loss, ear pain, nasal congestion, sore throat Cardiovascular: No chest pain, palpitations Respiratory:  No shortness of breath at rest or with exertion, wheezes GastrointestinaI: No nausea, vomiting, diarrhea, abdominal pain, fecal incontinence Genitourinary:  No dysuria, urinary retention or  frequency Musculoskeletal:  No neck pain, back pain Integumentary: No rash, pruritus, skin lesions Neurological: as above Psychiatric: No depression, insomnia, anxiety Endocrine: No palpitations, fatigue, diaphoresis, mood swings, change in appetite, change in weight, increased thirst Hematologic/Lymphatic:  No anemia, purpura, petechiae. Allergic/Immunologic: no itchy/runny eyes, nasal congestion, recent allergic reactions, rashes  PHYSICAL EXAM: Vitals:   05/28/16 1544  BP: 132/78  Pulse: 81   General: No acute distress Head:  Normocephalic/atraumatic Neck: supple, no paraspinal tenderness, full range of motion Heart:  Regular rate and rhythm Lungs:  Clear to auscultation bilaterally Back: No paraspinal tenderness Skin/Extremities: No rash, no edema Neurological Exam: alert and oriented to person, place, and month/year/season. No aphasia or dysarthria. Fund of knowledge is appropriate.  Remote memory intact.  Attention and concentration are normal.    Able to name objects and repeat phrases. CDT 5/5 MMSE - Mini Mental State Exam 06/06/2016 02/22/2016  Orientation to time 3 1  Orientation to Place 5 3  Registration 3 3  Attention/ Calculation 5 5  Recall 0 0  Language- name 2 objects 2 2  Language- repeat 1 1  Language- follow 3 step command 3 3  Language- read & follow direction 1 1  Write a sentence 1 1  Copy design 1 1  Total score 25 21    Cranial nerves: Pupils equal, round, reactive to light.  Fundoscopic exam unremarkable, no papilledema. Extraocular movements intact with no nystagmus. Visual fields full. Facial sensation intact. No facial asymmetry. Tongue, uvula, palate midline.  Motor: Bulk and tone normal, muscle strength 5/5 throughout with no pronator drift.  Sensation to light touch intact.  No extinction to double simultaneous stimulation.  Deep tendon reflexes +1 except for absent ankle jerks bilaterally. Toes downgoing.  Finger to nose testing intact.  Gait slow  and cautious, no ataxia. Romberg negative.  IMPRESSION: This is an 82 yo RH man with a history of hypertension, hyperlipidemia, hypothyroidism, and dementia, with mild dementia. His family reports that since his fall last month, he has been more confused. They are very surprised that he did better with MMSE testing today 25/30 (21/30 in August 2017). They had noticed an improvement with mood on Lexapro, continue 10mg  daily. He is on Aricept 20mg  daily, continue current dose. Due to report of fall with worsening confusion, head CT without contrast will be ordered to assess for bleed. We again discussed the importance of control of vascular risk factors, physical exercise, and brain stimulation exercises for brain health. We also discussed home safety and long-term planning, he would benefit from moving to higher level of care [facility]). He will follow-up in 6 months and knows to call for any changes.   Thank you for allowing me to participate in his care.  Please do not hesitate to call for any questions or concerns.  The duration of this appointment visit was 25 minutes of face-to-face time with the patient.  Greater than 50% of this time was spent in counseling, explanation of diagnosis, planning of further management, and coordination of care.   Ellouise Newer, M.D.   CC: Dr. Cheri Rous

## 2016-05-28 NOTE — Patient Instructions (Signed)
1. Continue all your medications 2. Schedule head CT without contrast 3. Continue home supervision 4. Follow-up in 6 months

## 2016-06-06 ENCOUNTER — Encounter: Payer: Self-pay | Admitting: Neurology

## 2016-06-06 DIAGNOSIS — F03A0 Unspecified dementia, mild, without behavioral disturbance, psychotic disturbance, mood disturbance, and anxiety: Secondary | ICD-10-CM | POA: Insufficient documentation

## 2016-06-06 DIAGNOSIS — F0390 Unspecified dementia without behavioral disturbance: Secondary | ICD-10-CM | POA: Insufficient documentation

## 2016-06-07 ENCOUNTER — Telehealth: Payer: Self-pay | Admitting: Family Medicine

## 2016-06-07 NOTE — Telephone Encounter (Signed)
Verbal orders called to [facility].

## 2016-06-07 NOTE — Telephone Encounter (Signed)
OK to give order to Miralax 17 gm po bid prn constipation

## 2016-06-07 NOTE — Telephone Encounter (Signed)
Caller name: Vevelyn Royals  Relation to pt: PT from [facility]  Call back number:[phone]   Reason for call:  PT requesting prn for  Requesting orders for prn miralax due to patient experiencing belching and being constipated, please advise

## 2016-06-07 NOTE — Telephone Encounter (Signed)
Ok to give verbal orders?

## 2016-06-09 ENCOUNTER — Other Ambulatory Visit: Payer: Self-pay | Admitting: Family Medicine

## 2016-06-12 ENCOUNTER — Telehealth: Payer: Self-pay | Admitting: Family Medicine

## 2016-06-12 NOTE — Telephone Encounter (Signed)
Pt dropped off FL2 forms, documents were placed in tray at front office

## 2016-06-12 NOTE — Telephone Encounter (Signed)
Caller name: Romain Kaiser. Relationship to patient: Son  Can be reached: [phone] Pharmacy:  Reason for call: Son will be dropping off an FL2 form that needs to be completed for the patient to move into an [place]. He also needs a written order for a TB test to be administered by Etheleen Sia, RN ([place]). She will administer and go back to read test.

## 2016-06-14 ENCOUNTER — Telehealth: Payer: Self-pay | Admitting: Neurology

## 2016-06-14 NOTE — Telephone Encounter (Signed)
Contacted daughter. Verified order was in and gave her [facility]'s number to schedule.

## 2016-06-14 NOTE — Telephone Encounter (Signed)
Louis Patel 15-Sep-2025. His daughter called # [phone]. She was calling regarding a  Ct Scan that was to be scheduled. Thank you

## 2016-06-19 DIAGNOSIS — N400 Enlarged prostate without lower urinary tract symptoms: Secondary | ICD-10-CM | POA: Diagnosis not present

## 2016-06-19 DIAGNOSIS — E669 Obesity, unspecified: Secondary | ICD-10-CM | POA: Diagnosis not present

## 2016-06-19 DIAGNOSIS — Z6833 Body mass index (BMI) 33.0-33.9, adult: Secondary | ICD-10-CM | POA: Diagnosis not present

## 2016-06-19 DIAGNOSIS — Z87440 Personal history of urinary (tract) infections: Secondary | ICD-10-CM | POA: Diagnosis not present

## 2016-06-19 DIAGNOSIS — M5136 Other intervertebral disc degeneration, lumbar region: Secondary | ICD-10-CM | POA: Diagnosis not present

## 2016-06-19 DIAGNOSIS — E039 Hypothyroidism, unspecified: Secondary | ICD-10-CM | POA: Diagnosis not present

## 2016-06-19 DIAGNOSIS — F0280 Dementia in other diseases classified elsewhere without behavioral disturbance: Secondary | ICD-10-CM | POA: Diagnosis not present

## 2016-06-19 DIAGNOSIS — J301 Allergic rhinitis due to pollen: Secondary | ICD-10-CM | POA: Diagnosis not present

## 2016-06-19 DIAGNOSIS — F329 Major depressive disorder, single episode, unspecified: Secondary | ICD-10-CM | POA: Diagnosis not present

## 2016-06-19 DIAGNOSIS — E785 Hyperlipidemia, unspecified: Secondary | ICD-10-CM | POA: Diagnosis not present

## 2016-06-19 DIAGNOSIS — Z5181 Encounter for therapeutic drug level monitoring: Secondary | ICD-10-CM

## 2016-06-19 DIAGNOSIS — Z7982 Long term (current) use of aspirin: Secondary | ICD-10-CM

## 2016-06-19 DIAGNOSIS — I10 Essential (primary) hypertension: Secondary | ICD-10-CM | POA: Diagnosis not present

## 2016-06-19 DIAGNOSIS — Z87891 Personal history of nicotine dependence: Secondary | ICD-10-CM | POA: Diagnosis not present

## 2016-06-19 NOTE — Telephone Encounter (Signed)
Patient's son is calling to check the status of this. Please advise.   Phone: [phone]

## 2016-06-20 NOTE — Telephone Encounter (Signed)
Called [place] with Alvis Lemmings and gave verbal order for PPD placement and read.  Called Daughter, Louis Patel, and made her aware that Fl2 form is available for pick up.  Copy made and placed in scan bin.  Original placed up front for pick up.

## 2016-06-20 NOTE — Telephone Encounter (Addendum)
Caller name: Marlowe Kays  Relation to pt : daughter  Call back number: [phone]   Reason for call:  Daughter would like to know why appointment is needed for [facility] form informed daughter face to face encounter is needed depending on what necessary information is needed, daughter states patient was seen in October and would like to know the status of TB Rx, please advise

## 2016-06-20 NOTE — Telephone Encounter (Signed)
Form filled out Ok to give order for ppd

## 2016-06-20 NOTE — Telephone Encounter (Signed)
Form filled out---  Ok to give order for ppd

## 2016-06-20 NOTE — Telephone Encounter (Signed)
Pt was seen in October for an acute visit.  Seen on January 25, 2016 for memory loss and follow up on other chronic conditions.  Notes and form forwarded to Dr. Etter Sjogren.    Dr. Etter Sjogren, also please advise on Rx for Home Health nurse placement and reading of TB skin test.

## 2016-06-21 MED ORDER — TUBERCULIN PPD 5 UNIT/0.1ML ID SOLN: 5.0000 [IU] | Freq: Once | INTRADERMAL | 0 refills | Status: AC

## 2016-06-21 NOTE — Telephone Encounter (Signed)
Louis Patel from [place] called and asked for Rx to printed and given to family for the solution for PPD.  Louis Patel does not keep any solution on hand.  Rx printed and forwarded to PCP for review and signature.

## 2016-06-21 NOTE — Addendum Note (Signed)
Addended by: Rudene Anda on: 06/21/2016 10:32 AM   Modules accepted: Orders

## 2016-06-24 ENCOUNTER — Telehealth: Payer: Self-pay | Admitting: Family Medicine

## 2016-06-24 NOTE — Telephone Encounter (Signed)
Caller name: Marlowe Kays Relationship to patient: Daughter Can be reached: [phone]    Reason for call: Patient is moving into a memory care facility and needs to have a TB Test done prior. Need to schedule to come in. Needs order.

## 2016-06-24 NOTE — Telephone Encounter (Signed)
Pt's son picked up Rx for TB test on Friday. LB

## 2016-06-27 NOTE — Telephone Encounter (Signed)
Caller name: Marlowe Kays Relationship to patient: Daughter Can be reached: [phone]  Reason for call: Marlowe Kays called back. She needs our office to do the TB test bc pharmacy will not dispense for [facility] to do it. Per Charlena Cross ok to schedule.

## 2016-07-02 ENCOUNTER — Ambulatory Visit (INDEPENDENT_AMBULATORY_CARE_PROVIDER_SITE_OTHER): Payer: Medicare Other | Admitting: Behavioral Health

## 2016-07-02 DIAGNOSIS — Z23 Encounter for immunization: Secondary | ICD-10-CM | POA: Diagnosis not present

## 2016-07-02 NOTE — Progress Notes (Signed)
Pre visit review using our clinic review tool, if applicable. No additional management support is needed unless otherwise documented below in the visit note.  Patient presented in clinic today for TB skin test. Intradermal placed on Left Arm. Patient tolerated injection well.  Patient will return on 07/04/16 to have PPD read.

## 2016-07-03 ENCOUNTER — Telehealth: Payer: Self-pay

## 2016-07-03 ENCOUNTER — Ambulatory Visit
Admission: RE | Admit: 2016-07-03 | Discharge: 2016-07-03 | Disposition: A | Discharge status: Home / Self Care | Payer: Medicare Other | Source: Ambulatory Visit | Attending: Neurology | Admitting: Neurology

## 2016-07-03 DIAGNOSIS — W19XXXA Unspecified fall, initial encounter: Secondary | ICD-10-CM

## 2016-07-03 DIAGNOSIS — F03A0 Unspecified dementia, mild, without behavioral disturbance, psychotic disturbance, mood disturbance, and anxiety: Secondary | ICD-10-CM

## 2016-07-03 DIAGNOSIS — F0390 Unspecified dementia without behavioral disturbance: Secondary | ICD-10-CM

## 2016-07-03 NOTE — Telephone Encounter (Signed)
-----   Message from Cameron Sprang, MD sent at 07/03/2016  4:06 PM EST ----- Pls let his daughter/son know that the brain scan did not show any evidence of bleed from the fall. It again showed age-related changes, no evidence of tumor, bleed, or stroke. Thanks

## 2016-07-03 NOTE — Telephone Encounter (Signed)
Pt came into the office yesterday for a Nurse visit to get TB injection , and pt received completed FL2. LB

## 2016-07-03 NOTE — Telephone Encounter (Signed)
Patient's daughter Marlowe Kays notified.

## 2016-07-04 ENCOUNTER — Encounter: Payer: Self-pay | Admitting: Behavioral Health

## 2016-07-04 LAB — TB SKIN TEST
Induration: 0 mm
TB Skin Test: NEGATIVE

## 2016-07-26 ENCOUNTER — Telehealth: Payer: Self-pay | Admitting: Family Medicine

## 2016-07-26 NOTE — Telephone Encounter (Signed)
Faxed completed Fl2 form to [place]

## 2016-07-26 NOTE — Telephone Encounter (Signed)
[place] called to confirm that Dr. Etter Sjogren- Cheri Rous received the [facility] form that was faxed over. Please advise.  Phone: [phone]

## 2016-09-07 ENCOUNTER — Other Ambulatory Visit: Payer: Self-pay | Admitting: Family Medicine

## 2016-12-04 ENCOUNTER — Encounter: Payer: Self-pay | Admitting: Neurology

## 2016-12-04 ENCOUNTER — Ambulatory Visit (INDEPENDENT_AMBULATORY_CARE_PROVIDER_SITE_OTHER): Payer: Medicare Other | Admitting: Neurology

## 2016-12-04 VITALS — BP 132/84 | HR 74 | Ht 66.0 in | Wt 234.0 lb

## 2016-12-04 DIAGNOSIS — F0390 Unspecified dementia without behavioral disturbance: Secondary | ICD-10-CM

## 2016-12-04 DIAGNOSIS — F03A0 Unspecified dementia, mild, without behavioral disturbance, psychotic disturbance, mood disturbance, and anxiety: Secondary | ICD-10-CM

## 2016-12-04 NOTE — Patient Instructions (Signed)
1. Reduce Lexapro to 5mg  daily 2. Continue Aricept 20mg  daily 3. Continue to monitor mood, if it becomes more uncontrollable, consideration for starting Depakote 4. Follow-up in 6 months, call for any changes

## 2016-12-04 NOTE — Progress Notes (Signed)
NEUROLOGY FOLLOW UP OFFICE NOTE  Louis Patel Sr [id number]  HISTORY OF PRESENT ILLNESS: I had the pleasure of seeing Louis Patel in follow-up in the neurology clinic on 12/04/2016.  The patient was last seen 6 months ago and is again accompanied by his daughter who helps supplement the history today.  MMSE 25/30 in November 2017 (21/30 in August 2017). He is taking Aricept 20mg  daily. Lexapro was added in August 2017, his family felt he was doing much better with regards to mood. On his last visit, family reported worsening of memory after a fall. Head CT did not show any acute changes. He has since moved to [place] home. His daughter feels memory continues to worsen. He could not give some of his grandchildren's names. He does not think it's true that his memory is going. His daughter reports he is writing things more. More concerning for the family is the personality change. They wonder if the "happy meds are making him too happy." He is disinhibited in the office, singing loudly, asking if he could kiss staff. His daughter reports he is doing a lot of things out of character, cursing a lot, telling a couple he did not like that he hated them. He now has to sit with his back to them in the dining hall. He was in the exercise room one time and stood up and started turning the lights off and on like a child. He talks non-stop. They report sleep is fine. He denies any headaches, dizziness, diplopia, dysarthria, dysphagia, neck/back pain, focal numbness/tingling/weakness. He fell out of the chair one time, no injuries.   HPI 02/21/2016: This is an 83 yo RH man with a history of hypertension, hyperlipidemia, hypothyroidism, dementia, with worsening memory. His daughter started noticing changes in 2012 and had seen neurologist Dr. Krista Blue in 2013. At that time, MMSE was 26/30. I personally reviewed MRI brain done 02/2011 which did not show any acute changes, there was mild diffuse atrophy and  chronic microvascular disease. He was started on Aricept. His daughter reports that they moved him from his house to an independent living facility this year because he was not taking his medications. Since the move, she feels he has worsened, both short-term and long-term memory have been affected, he repeats himself, he does not like to take a shower or change in underwear, family has to make him shower twice a week. His daughter reports "he does not want to try." He used to be good in folding laundry from the dryer, but started forgetting, so now his daughter does laundry for him. He has a caregiver administering medications for him. Meals are provided by the facility, as well as cleaning staff. He stopped driving a few months ago and denied getting lost previously. His daughter has also noticed some gait changes, he appears to be shuffling in the past year. No falls. Aricept dose was increased to 2 tablets daily, no side effects on medications. He denies any significant head injuries or alcohol intake. His 2 brothers and sister had dementia.   PAST MEDICAL HISTORY: Past Medical History:  Diagnosis Date  . Colon polyps   . Dementia   . Diverticulosis   . Enlarged prostate   . Hyperlipidemia   . Hypertension   . Hypothyroidism     MEDICATIONS: Current Outpatient Prescriptions on File Prior to Visit  Medication Sig Dispense Refill  . aspirin 81 MG EC tablet Take 81 mg by mouth daily.      Marland Kitchen  donepezil (ARICEPT) 10 MG tablet Take 2 tablets (20 mg total) by mouth daily. 180 tablet 1  . escitalopram (LEXAPRO) 10 MG tablet Take 1 tablet (10 mg total) by mouth daily. 30 tablet 6  . finasteride (PROSCAR) 5 MG tablet Take 1 tablet (5 mg total) by mouth as directed. (Patient taking differently: Take 5 mg by mouth daily. )    . hydrochlorothiazide (HYDRODIURIL) 25 MG tablet TAKE 1 TABLET BY MOUTH DAILY 90 tablet 0  . levothyroxine (SYNTHROID, LEVOTHROID) 50 MCG tablet TAKE 1 TABLET BY MOUTH EVERY DAY  90 tablet 3  . loratadine (CLARITIN) 10 MG tablet Take 1 tablet (10 mg total) by mouth daily. 30 tablet 11  . Misc. Devices (COMMODE BEDSIDE) MISC 1 device to use as needed 1 each 0  . Misc. Devices (COMMODE BEDSIDE) MISC Use Device daily as needed. 1 each 0  . Misc. Devices (ROLLATOR ULTRA-LIGHT) MISC 1 Rollator walker to use and needed 1 each 0  . traMADol (ULTRAM) 50 MG tablet Take 1 tablet (50 mg total) by mouth every 6 (six) hours as needed. 15 tablet 0   No current facility-administered medications on file prior to visit.     ALLERGIES: No Known Allergies  FAMILY HISTORY: Family History  Problem Relation Age of Onset  . Stroke Mother   . Hypertension Mother   . Esophageal cancer Brother   . Heart disease Unknown   . Stroke Sister   . Dementia Brother   . Dementia Brother   . Dementia Sister     SOCIAL HISTORY: Social History   Social History  . Marital status: Widowed    Spouse name: N/A  . Number of children: 3  . Years of education: N/A   Occupational History  . retired    Social History Main Topics  . Smoking status: Former Smoker    Packs/day: 1.00    Years: 20.00    Types: Cigarettes    Quit date: 08/24/1962  . Smokeless tobacco: Never Used  . Alcohol use No  . Drug use: No  . Sexual activity: Not Currently    Partners: Female   Other Topics Concern  . Not on file   Social History Narrative  . No narrative on file    REVIEW OF SYSTEMS: Constitutional: No fevers, chills, or sweats, no generalized fatigue, change in appetite Eyes: No visual changes, double vision, eye pain Ear, nose and throat: No hearing loss, ear pain, nasal congestion, sore throat Cardiovascular: No chest pain, palpitations Respiratory:  No shortness of breath at rest or with exertion, wheezes GastrointestinaI: No nausea, vomiting, diarrhea, abdominal pain, fecal incontinence Genitourinary:  No dysuria, urinary retention or frequency Musculoskeletal:  No neck pain, back  pain Integumentary: No rash, pruritus, skin lesions Neurological: as above Psychiatric: No depression, insomnia, anxiety Endocrine: No palpitations, fatigue, diaphoresis, mood swings, change in appetite, change in weight, increased thirst Hematologic/Lymphatic:  No anemia, purpura, petechiae. Allergic/Immunologic: no itchy/runny eyes, nasal congestion, recent allergic reactions, rashes  PHYSICAL EXAM: Vitals:   12/04/16 1611  BP: 132/84  Pulse: 74   General: No acute distress Head:  Normocephalic/atraumatic Neck: supple, no paraspinal tenderness, full range of motion Heart:  Regular rate and rhythm Lungs:  Clear to auscultation bilaterally Back: No paraspinal tenderness Skin/Extremities: No rash, no edema Neurological Exam: alert and oriented to person, city/state, says he is at [facility]. Knows year, day of week, season. No aphasia or dysarthria. Fund of knowledge is appropriate.  Remote memory intact.  Attention and  concentration are normal.    Able to name objects and repeat phrases. CDT 5/5 MMSE - Mini Mental State Exam 12/04/2016 06/06/2016 02/22/2016  Orientation to time 3 3 1   Orientation to Place 3 5 3   Registration 3 3 3   Attention/ Calculation 5 5 5   Recall 0 0 0  Language- name 2 objects 2 2 2   Language- repeat 1 1 1   Language- follow 3 step command 3 3 3   Language- read & follow direction 1 1 1   Write a sentence 1 1 1   Copy design 1 1 1   Total score 23 25 21     Cranial nerves: Pupils equal, round, reactive to light.  Extraocular movements intact with no nystagmus. Visual fields full. Facial sensation intact. No facial asymmetry. Tongue, uvula, palate midline.  Motor: Bulk and tone normal, muscle strength 5/5 throughout with no pronator drift.  Sensation to light touch intact.  No extinction to double simultaneous stimulation.  Deep tendon reflexes +1 except for absent ankle jerks bilaterally. Toes downgoing.  Finger to nose testing intact.  Gait slow and cautious with  walker, no ataxia. Romberg negative.  IMPRESSION: This is an 83 yo RH man with a history of hypertension, hyperlipidemia, hypothyroidism, and dementia, with mild dementia, now with behavioral changes with disinhibition. MMSE today 23/30 (25/30 in November 2017, 21/30 in August 2017). He is taking Aricept 20mg  daily. His family feels the Lexapro may be too much, we will reduce dose to 5mg  daily. We discussed that if behavioral changes become difficult to control, consideration for starting Depakote will be done. He does not drive. He will follow-up in 6 months and knows to call for any changes.   Thank you for allowing me to participate in his care.  Please do not hesitate to call for any questions or concerns.  The duration of this appointment visit was 25 minutes of face-to-face time with the patient.  Greater than 50% of this time was spent in counseling, explanation of diagnosis, planning of further management, and coordination of care.   Ellouise Newer, M.D.   CC: Dr. Cheri Rous

## 2017-02-18 ENCOUNTER — Telehealth: Payer: Self-pay | Admitting: Neurology

## 2017-02-18 NOTE — Telephone Encounter (Signed)
PT's daughter called and she he is becoming very aggressive to residents and staff where he is staying, he is threatening them and she needs a call back to please advise

## 2017-02-19 ENCOUNTER — Other Ambulatory Visit: Payer: Self-pay

## 2017-02-19 DIAGNOSIS — F0391 Unspecified dementia with behavioral disturbance: Secondary | ICD-10-CM

## 2017-02-19 NOTE — Telephone Encounter (Signed)
Spoke with pt's daughter who states that pt is not sleeping and is now threatening to hurt/hit/kill other residents and staff at [facility].  She also states that because of this he is no longer allowed to interact with the other residents.  They are wanting to send him to [place] to detox from all medications and want Dr. Delice Lesch to enter the order.  Will call Angie with [facility] for further information.

## 2017-02-19 NOTE — Telephone Encounter (Signed)
Patient's daughter called back. She is very concerned about her father's behavior. I was able to schedule him for this Friday for a Follow Up. Please call. Thanks

## 2017-02-19 NOTE — Telephone Encounter (Signed)
Spoke with Amy and Angie with [facility] who state that pt is showing major behavior disturbances over the past 3-4 weeks.   He yells threats at the other residents and cusses staff our.  Amy had said that he is sleeping through the night however Angie states that he has bouts of not sleeping through the night.  They have increased his Namenda to 10mg  and he has also started taking Serequel 25mg  in the morning and 50mg  each evening.    Per Dr. Delice Lesch, [place] to send order for inpatient behavior health for behavioral disturbances. Will put orders in and fax to [place].

## 2017-02-20 ENCOUNTER — Encounter (HOSPITAL_COMMUNITY): Payer: Self-pay | Admitting: Emergency Medicine

## 2017-02-20 ENCOUNTER — Emergency Department (HOSPITAL_COMMUNITY): Payer: No Typology Code available for payment source

## 2017-02-20 ENCOUNTER — Emergency Department (HOSPITAL_COMMUNITY)
Admission: EM | Admit: 2017-02-20 | Discharge: 2017-02-23 | Disposition: A | Discharge status: Home / Self Care | Payer: No Typology Code available for payment source | Attending: Emergency Medicine | Admitting: Emergency Medicine

## 2017-02-20 DIAGNOSIS — F6089 Other specific personality disorders: Secondary | ICD-10-CM | POA: Diagnosis not present

## 2017-02-20 DIAGNOSIS — G308 Other Alzheimer's disease: Secondary | ICD-10-CM | POA: Diagnosis not present

## 2017-02-20 DIAGNOSIS — Z79899 Other long term (current) drug therapy: Secondary | ICD-10-CM | POA: Diagnosis not present

## 2017-02-20 DIAGNOSIS — R4689 Other symptoms and signs involving appearance and behavior: Secondary | ICD-10-CM

## 2017-02-20 DIAGNOSIS — E039 Hypothyroidism, unspecified: Secondary | ICD-10-CM | POA: Insufficient documentation

## 2017-02-20 DIAGNOSIS — F0281 Dementia in other diseases classified elsewhere with behavioral disturbance: Secondary | ICD-10-CM | POA: Diagnosis not present

## 2017-02-20 DIAGNOSIS — F419 Anxiety disorder, unspecified: Secondary | ICD-10-CM | POA: Diagnosis not present

## 2017-02-20 DIAGNOSIS — I10 Essential (primary) hypertension: Secondary | ICD-10-CM | POA: Insufficient documentation

## 2017-02-20 DIAGNOSIS — R4182 Altered mental status, unspecified: Secondary | ICD-10-CM | POA: Diagnosis not present

## 2017-02-20 DIAGNOSIS — Z87891 Personal history of nicotine dependence: Secondary | ICD-10-CM | POA: Insufficient documentation

## 2017-02-20 DIAGNOSIS — F03918 Unspecified dementia, unspecified severity, with other behavioral disturbance: Secondary | ICD-10-CM | POA: Diagnosis present

## 2017-02-20 DIAGNOSIS — F0391 Unspecified dementia with behavioral disturbance: Secondary | ICD-10-CM | POA: Diagnosis present

## 2017-02-20 DIAGNOSIS — E785 Hyperlipidemia, unspecified: Secondary | ICD-10-CM | POA: Insufficient documentation

## 2017-02-20 DIAGNOSIS — Z0000 Encounter for general adult medical examination without abnormal findings: Secondary | ICD-10-CM

## 2017-02-20 DIAGNOSIS — Z046 Encounter for general psychiatric examination, requested by authority: Secondary | ICD-10-CM | POA: Diagnosis present

## 2017-02-20 DIAGNOSIS — Z810 Family history of intellectual disabilities: Secondary | ICD-10-CM | POA: Diagnosis not present

## 2017-02-20 LAB — ETHANOL: Alcohol, Ethyl (B): <5 mg/dL (ref <5)

## 2017-02-20 LAB — CBC WITH DIFFERENTIAL/PLATELET
BASOS PCT: 0 %
Basophils Absolute: 0 10*3/uL (ref 0.0–0.1)
Eosinophils Absolute: 0.5 10*3/uL (ref 0.0–0.7)
Eosinophils Relative: 7 %
HCT: 40.2 % (ref 39.0–52.0)
HEMOGLOBIN: 13.3 g/dL (ref 13.0–17.0)
Lymphocytes Relative: 25 %
Lymphs Abs: 1.8 10*3/uL (ref 0.7–4.0)
MCH: 28.2 pg (ref 26.0–34.0)
MCHC: 33.1 g/dL (ref 30.0–36.0)
MCV: 85.2 fL (ref 78.0–100.0)
MONOS PCT: 10 %
Monocytes Absolute: 0.7 10*3/uL (ref 0.1–1.0)
NEUTROS PCT: 58 %
Neutro Abs: 4.3 10*3/uL (ref 1.7–7.7)
Platelets: 203 10*3/uL (ref 150–400)
RBC: 4.72 MIL/uL (ref 4.22–5.81)
RDW: 14.9 % (ref 11.5–15.5)
WBC: 7.2 10*3/uL (ref 4.0–10.5)

## 2017-02-20 LAB — SALICYLATE LEVEL

## 2017-02-20 LAB — URINALYSIS, ROUTINE W REFLEX MICROSCOPIC

## 2017-02-20 LAB — COMPREHENSIVE METABOLIC PANEL
ALBUMIN: 3.5 g/dL (ref 3.5–5.0)
ALK PHOS: 61 U/L (ref 38–126)
ALT: 14 U/L — ABNORMAL LOW (ref 17–63)
ANION GAP: 6 (ref 5–15)
AST: 17 U/L (ref 15–41)
BUN: 23 mg/dL — AB (ref 6–20)
CO2: 31 mmol/L (ref 22–32)
Calcium: 8.8 mg/dL — ABNORMAL LOW (ref 8.9–10.3)
Chloride: 102 mmol/L (ref 101–111)
Creatinine, Ser: 1 mg/dL (ref 0.61–1.24)
GFR calc non Af Amer: >60 mL/min (ref >60)
GLUCOSE: 100 mg/dL — AB (ref 65–99)
POTASSIUM: 3.8 mmol/L (ref 3.5–5.1)
SODIUM: 139 mmol/L (ref 135–145)
TOTAL PROTEIN: 6.3 g/dL — AB (ref 6.5–8.1)
Total Bilirubin: 0.5 mg/dL (ref 0.3–1.2)

## 2017-02-20 LAB — URINALYSIS, MICROSCOPIC (REFLEX)

## 2017-02-20 LAB — RAPID URINE DRUG SCREEN, HOSP PERFORMED
Amphetamines: NOT DETECTED
Barbiturates: NOT DETECTED
Benzodiazepines: NOT DETECTED
COCAINE: NOT DETECTED
OPIATES: NOT DETECTED
Tetrahydrocannabinol: NOT DETECTED

## 2017-02-20 LAB — ACETAMINOPHEN LEVEL: Acetaminophen (Tylenol), Serum: <10 ug/mL — ABNORMAL LOW (ref 10–30)

## 2017-02-20 LAB — BRAIN NATRIURETIC PEPTIDE: B Natriuretic Peptide: 94.9 pg/mL (ref 0.0–100.0)

## 2017-02-20 MED ORDER — NYSTATIN 100000 UNIT/GM EX POWD
2.0000 g | Freq: Two times a day (BID) | CUTANEOUS | Status: DC | PRN
  Filled 2017-02-20: qty 15

## 2017-02-20 MED ORDER — HYDROCHLOROTHIAZIDE 25 MG PO TABS
25.0000 mg | ORAL_TABLET | Freq: Every day | ORAL | Status: DC
  Administered 2017-02-21 – 2017-02-23 (×3): 25 mg via ORAL
  Filled 2017-02-20 (×3): qty 1

## 2017-02-20 MED ORDER — ACETAMINOPHEN 325 MG PO TABS: 650.0000 mg | ORAL_TABLET | ORAL | Status: DC | PRN

## 2017-02-20 MED ORDER — LEVOTHYROXINE SODIUM 50 MCG PO TABS
50.0000 ug | ORAL_TABLET | Freq: Every day | ORAL | Status: DC
  Administered 2017-02-21 – 2017-02-23 (×3): 50 ug via ORAL
  Filled 2017-02-20 (×3): qty 1

## 2017-02-20 MED ORDER — QUETIAPINE FUMARATE 50 MG PO TABS
50.0000 mg | ORAL_TABLET | Freq: Every day | ORAL | Status: DC
  Administered 2017-02-20: 50 mg via ORAL
  Filled 2017-02-20: qty 1

## 2017-02-20 MED ORDER — FINASTERIDE 5 MG PO TABS
5.0000 mg | ORAL_TABLET | Freq: Every day | ORAL | Status: DC
  Administered 2017-02-21 – 2017-02-23 (×3): 5 mg via ORAL
  Filled 2017-02-20 (×3): qty 1

## 2017-02-20 MED ORDER — TRAZODONE HCL 100 MG PO TABS
100.0000 mg | ORAL_TABLET | Freq: Every day | ORAL | Status: DC
  Administered 2017-02-20 – 2017-02-22 (×3): 100 mg via ORAL
  Filled 2017-02-20 (×3): qty 1

## 2017-02-20 NOTE — ED Triage Notes (Signed)
Pt here with family from [facility]. When this RN asked family why patient was here, family would only reply that there was "an order in the system" and they handed this RN care notes from facility. Pt's most recent note was from 8/9. They list combative behavior. The notes also mention a urinalysis, but no results are mentioned. Also there is no elaboration on any description on what type of order Dr. Delice Lesch wanted

## 2017-02-20 NOTE — ED Notes (Signed)
Pt.'s  Daughter , Rochele Raring called and updated on pt.'s latest condition and that pt. Will be seen by Psych MD in a.m., verbalized understanding .
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
## 2017-02-20 NOTE — BH Assessment (Signed)
This clinician spoke with Louis Patel provider on call for [facility]. Overnight observation was recommended with Seroquel 25 mg at night and 25 mg PRN. Spoke with Dr. Johnney Killian about recommendation. She is in agreement with overnight observation. Reports the patient is already on Seroquel 50 mg. Dr. Johnney Killian request additional medication recommends. Will discuss with provider on call.

## 2017-02-20 NOTE — Progress Notes (Signed)
Chart reviewed and case discussed with Dr. Modesta Messing. EDP requests medication recommendations. Recommend increasing Seroquel to 50 mg QAM and 100 mg QHS and adding Seroquel 50 mg every 8 hours prn agitation. Dr. Johnney Killian notified.  Lindon Romp, APRN, FNP-BC, NP-C

## 2017-02-21 ENCOUNTER — Emergency Department (HOSPITAL_COMMUNITY): Payer: No Typology Code available for payment source

## 2017-02-21 ENCOUNTER — Encounter (HOSPITAL_COMMUNITY): Payer: Self-pay | Admitting: *Deleted

## 2017-02-21 ENCOUNTER — Ambulatory Visit: Payer: Medicare Other | Admitting: Neurology

## 2017-02-21 DIAGNOSIS — F0391 Unspecified dementia with behavioral disturbance: Secondary | ICD-10-CM

## 2017-02-21 DIAGNOSIS — Z810 Family history of intellectual disabilities: Secondary | ICD-10-CM | POA: Diagnosis not present

## 2017-02-21 DIAGNOSIS — F03918 Unspecified dementia, unspecified severity, with other behavioral disturbance: Secondary | ICD-10-CM | POA: Diagnosis present

## 2017-02-21 DIAGNOSIS — R413 Other amnesia: Secondary | ICD-10-CM | POA: Diagnosis not present

## 2017-02-21 DIAGNOSIS — F419 Anxiety disorder, unspecified: Secondary | ICD-10-CM | POA: Diagnosis not present

## 2017-02-21 DIAGNOSIS — Z87891 Personal history of nicotine dependence: Secondary | ICD-10-CM | POA: Diagnosis not present

## 2017-02-21 LAB — URINALYSIS, ROUTINE W REFLEX MICROSCOPIC
BILIRUBIN URINE: NEGATIVE
Bacteria, UA: NONE SEEN
GLUCOSE, UA: NEGATIVE mg/dL
Ketones, ur: NEGATIVE mg/dL
Leukocytes, UA: NEGATIVE
NITRITE: NEGATIVE
PH: 6 (ref 5.0–8.0)
Protein, ur: NEGATIVE mg/dL
SPECIFIC GRAVITY, URINE: 1.01 (ref 1.005–1.030)

## 2017-02-21 MED ORDER — OLANZAPINE 2.5 MG PO TABS
ORAL_TABLET | ORAL | Status: AC
  Administered 2017-02-21: 2.5 mg
  Filled 2017-02-21: qty 1

## 2017-02-21 MED ORDER — OLANZAPINE 5 MG PO TBDP: 2.5000 mg | ORAL_TABLET | Freq: Every day | ORAL | Status: DC

## 2017-02-21 MED ORDER — ASENAPINE MALEATE 5 MG SL SUBL
5.0000 mg | SUBLINGUAL_TABLET | Freq: Two times a day (BID) | SUBLINGUAL | Status: DC
  Administered 2017-02-21 – 2017-02-23 (×5): 5 mg via SUBLINGUAL
  Filled 2017-02-21 (×6): qty 1

## 2017-02-21 MED ORDER — OLANZAPINE 5 MG PO TBDP
ORAL_TABLET | ORAL | Status: AC
  Filled 2017-02-21: qty 1
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
## 2017-02-21 NOTE — ED Notes (Signed)
Vomit found on pt floor.

## 2017-02-21 NOTE — ED Notes (Signed)
Pt removed the 1st saphris from his mouth and threw it on the floor.

## 2017-02-21 NOTE — Consult Note (Signed)
[facility] Consult   Reason for Consult:  Dementia with aggression Referring Physician:  EDP Patient Identification: Jose Corvin MRN:  [id number] Principal Diagnosis: Dementia with behavioral disturbance Diagnosis:   Patient Active Problem List   Diagnosis Date Noted  . Dementia with behavioral disturbance [F03.91] 02/21/2017    Priority: High  . Mild dementia [F03.90] 06/06/2016  . Hypothyroidism [E03.9] 01/25/2016  . UTI (urinary tract infection) [N39.0] 06/19/2015  . Obesity (BMI 30-39.9) [E66.9] 02/22/2013  . Memory loss [R41.3] 01/22/2011  . MOLE [D23.9] 05/22/2010  . DEPRESSIVE DISORDER [F32.9] 05/22/2010  . ALLERGIC RHINITIS CAUSE UNSPECIFIED [J30.9] 10/17/2008  . PSA, INCREASED [R97.20] 05/23/2008  . Hyperlipidemia [E78.5] 02/26/2007  . Essential hypertension [I10] 02/26/2007  . HEMORRHOIDS, EXTERNAL [K64.4] 02/26/2007    Total Time spent with patient: 45 minutes  Subjective:   ERYCK NEGRON Sr is a 83 y.o. male patient admitted with behavior changes, aggression.  HPI:  83 yo male who was brought to the ED after having an increase in aggression to the point of threatening to punch an individual in the face.  Today, he is cooperative and his daughter, Rochele Raring, [place], gave collateral information.  His medications were changed recently and his aggression increased.  No suicidal/homicidal ideations but threatens others frequently.  Inpatient hospitalization needed  Past Psychiatric History: dementia  Risk to Self: Suicidal Ideation: No Suicidal Intent: No Is patient at risk for suicide?: No Suicidal Plan?: No Access to Means: No What has been your use of drugs/alcohol within the last 12 months?: n/a How many times?: 0 Other Self Harm Risks: 0 Intentional Self Injurious Behavior: None Risk to Others: Homicidal Ideation: No Thoughts of Harm to Others: Yes-Currently Present Comment - Thoughts of Harm to Others: verbally aggressive toward  people at care home, and threatening Current Homicidal Intent: No Current Homicidal Plan: No Access to Homicidal Means: No History of harm to others?: No Assessment of Violence: None Noted Violent Behavior Description: n/a Does patient have access to weapons?: No Criminal Charges Pending?: No Does patient have a court date: No Prior Inpatient Therapy: Prior Inpatient Therapy: No Prior Outpatient Therapy: Prior Outpatient Therapy: No Does patient have an ACCT team?: No Does patient have Intensive In-House Services?  : No Does patient have [facility] services? : No Does patient have P4[facility] services?: No  Past Medical History:  Past Medical History:  Diagnosis Date  . Colon polyps   . Dementia   . Diverticulosis   . Enlarged prostate   . Hyperlipidemia   . Hypertension   . Hypothyroidism     Past Surgical History:  Procedure Laterality Date  . CATARACT EXTRACTION Left 07/18/08   left cataract extraction  . CATARACT EXTRACTION Right   . COLONOSCOPY    . PROSTATE SURGERY  3/08   Prostatectomy  . TONSILLECTOMY     Family History:  Family History  Problem Relation Age of Onset  . Stroke Mother   . Hypertension Mother   . Esophageal cancer Brother   . Heart disease Unknown   . Stroke Sister   . Dementia Brother   . Dementia Brother   . Dementia Sister    Family Psychiatric  History: unknown Social History:  History  Alcohol Use No     History  Drug Use No    Social History   Social History  . Marital status: Widowed    Spouse name: N/A  . Number of children: 3  . Years of education: N/A  Occupational History  . retired    Social History Main Topics  . Smoking status: Former Smoker    Packs/day: 1.00    Years: 20.00    Types: Cigarettes    Quit date: 08/24/1962  . Smokeless tobacco: Never Used  . Alcohol use No  . Drug use: No  . Sexual activity: Not Currently    Partners: Female   Other Topics Concern  . None   Social History Narrative  . None    Additional Social History:    Allergies:  No Known Allergies  Labs:  Results for orders placed or performed during the hospital encounter of 02/20/17 (from the past 48 hour(s))  Urinalysis, Routine w reflex microscopic     Status: Abnormal   Collection Time: 02/20/17  3:22 PM  Result Value Ref Range   Color, Urine RED (A) YELLOW    Comment: BIOCHEMICALS MAY BE AFFECTED BY COLOR   APPearance TURBID (A) CLEAR   Specific Gravity, Urine  1.005 - 1.030    TEST NOT REPORTED DUE TO COLOR INTERFERENCE OF URINE PIGMENT   pH  5.0 - 8.0    TEST NOT REPORTED DUE TO COLOR INTERFERENCE OF URINE PIGMENT   Glucose, UA (A) NEGATIVE mg/dL    TEST NOT REPORTED DUE TO COLOR INTERFERENCE OF URINE PIGMENT   Hgb urine dipstick (A) NEGATIVE    TEST NOT REPORTED DUE TO COLOR INTERFERENCE OF URINE PIGMENT   Bilirubin Urine (A) NEGATIVE    TEST NOT REPORTED DUE TO COLOR INTERFERENCE OF URINE PIGMENT   Ketones, ur (A) NEGATIVE mg/dL    TEST NOT REPORTED DUE TO COLOR INTERFERENCE OF URINE PIGMENT   Protein, ur (A) NEGATIVE mg/dL    TEST NOT REPORTED DUE TO COLOR INTERFERENCE OF URINE PIGMENT   Nitrite (A) NEGATIVE    TEST NOT REPORTED DUE TO COLOR INTERFERENCE OF URINE PIGMENT   Leukocytes, UA (A) NEGATIVE    TEST NOT REPORTED DUE TO COLOR INTERFERENCE OF URINE PIGMENT  Urine rapid drug screen (hosp performed)     Status: None   Collection Time: 02/20/17  3:22 PM  Result Value Ref Range   Opiates NONE DETECTED NONE DETECTED   Cocaine NONE DETECTED NONE DETECTED   Benzodiazepines NONE DETECTED NONE DETECTED   Amphetamines NONE DETECTED NONE DETECTED   Tetrahydrocannabinol NONE DETECTED NONE DETECTED   Barbiturates NONE DETECTED NONE DETECTED    Comment:        DRUG SCREEN FOR MEDICAL PURPOSES ONLY.  IF CONFIRMATION IS NEEDED FOR ANY PURPOSE, NOTIFY LAB WITHIN 5 DAYS.        LOWEST DETECTABLE LIMITS FOR URINE DRUG SCREEN Drug Class       Cutoff (ng/mL) Amphetamine      1000 Barbiturate       200 Benzodiazepine   878 Tricyclics       676 Opiates          300 Cocaine          300 THC              50   Urinalysis, Microscopic (reflex)     Status: Abnormal   Collection Time: 02/20/17  3:22 PM  Result Value Ref Range   RBC / HPF TOO NUMEROUS TO COUNT 0 - 5 RBC/hpf   WBC, UA 0-5 0 - 5 WBC/hpf   Bacteria, UA RARE (A) NONE SEEN   Squamous Epithelial / LPF 0-5 (A) NONE SEEN  Comprehensive metabolic panel  Status: Abnormal   Collection Time: 02/20/17  4:11 PM  Result Value Ref Range   Sodium 139 135 - 145 mmol/L   Potassium 3.8 3.5 - 5.1 mmol/L   Chloride 102 101 - 111 mmol/L   CO2 31 22 - 32 mmol/L   Glucose, Bld 100 (H) 65 - 99 mg/dL   BUN 23 (H) 6 - 20 mg/dL   Creatinine, Ser 1.00 0.61 - 1.24 mg/dL   Calcium 8.8 (L) 8.9 - 10.3 mg/dL   Total Protein 6.3 (L) 6.5 - 8.1 g/dL   Albumin 3.5 3.5 - 5.0 g/dL   AST 17 15 - 41 U/L   ALT 14 (L) 17 - 63 U/L   Alkaline Phosphatase 61 38 - 126 U/L   Total Bilirubin 0.5 0.3 - 1.2 mg/dL   GFR calc non Af Amer >60 >60 mL/min   GFR calc Af Amer >60 >60 mL/min    Comment: (NOTE) The eGFR has been calculated using the CKD EPI equation. This calculation has not been validated in all clinical situations. eGFR's persistently <60 mL/min signify possible Chronic Kidney Disease.    Anion gap 6 5 - 15  Ethanol     Status: None   Collection Time: 02/20/17  4:11 PM  Result Value Ref Range   Alcohol, Ethyl (B) <5 <5 mg/dL    Comment:        LOWEST DETECTABLE LIMIT FOR SERUM ALCOHOL IS 5 mg/dL FOR MEDICAL PURPOSES ONLY   Acetaminophen level     Status: Abnormal   Collection Time: 02/20/17  4:11 PM  Result Value Ref Range   Acetaminophen (Tylenol), Serum <10 (L) 10 - 30 ug/mL    Comment:        THERAPEUTIC CONCENTRATIONS VARY SIGNIFICANTLY. A RANGE OF 10-30 ug/mL MAY BE AN EFFECTIVE CONCENTRATION FOR MANY PATIENTS. HOWEVER, SOME ARE BEST TREATED AT CONCENTRATIONS OUTSIDE THIS RANGE. ACETAMINOPHEN CONCENTRATIONS >150 ug/mL AT 4  HOURS AFTER INGESTION AND >50 ug/mL AT 12 HOURS AFTER INGESTION ARE OFTEN ASSOCIATED WITH TOXIC REACTIONS.   Brain natriuretic peptide     Status: None   Collection Time: 02/20/17  4:11 PM  Result Value Ref Range   B Natriuretic Peptide 94.9 0.0 - 099.8 pg/mL  Salicylate level     Status: None   Collection Time: 02/20/17  4:11 PM  Result Value Ref Range   Salicylate Lvl <3.3 2.8 - 30.0 mg/dL  CBC with Differential     Status: None   Collection Time: 02/20/17  4:11 PM  Result Value Ref Range   WBC 7.2 4.0 - 10.5 K/uL   RBC 4.72 4.22 - 5.81 MIL/uL   Hemoglobin 13.3 13.0 - 17.0 g/dL   HCT 40.2 39.0 - 52.0 %   MCV 85.2 78.0 - 100.0 fL   MCH 28.2 26.0 - 34.0 pg   MCHC 33.1 30.0 - 36.0 g/dL   RDW 14.9 11.5 - 15.5 %   Platelets 203 150 - 400 K/uL   Neutrophils Relative % 58 %   Neutro Abs 4.3 1.7 - 7.7 K/uL   Lymphocytes Relative 25 %   Lymphs Abs 1.8 0.7 - 4.0 K/uL   Monocytes Relative 10 %   Monocytes Absolute 0.7 0.1 - 1.0 K/uL   Eosinophils Relative 7 %   Eosinophils Absolute 0.5 0.0 - 0.7 K/uL   Basophils Relative 0 %   Basophils Absolute 0.0 0.0 - 0.1 K/uL  Urinalysis, Routine w reflex microscopic     Status: Abnormal   Collection Time:  02/21/17 10:38 AM  Result Value Ref Range   Color, Urine YELLOW YELLOW   APPearance CLEAR CLEAR   Specific Gravity, Urine 1.010 1.005 - 1.030   pH 6.0 5.0 - 8.0   Glucose, UA NEGATIVE NEGATIVE mg/dL   Hgb urine dipstick MODERATE (A) NEGATIVE   Bilirubin Urine NEGATIVE NEGATIVE   Ketones, ur NEGATIVE NEGATIVE mg/dL   Protein, ur NEGATIVE NEGATIVE mg/dL   Nitrite NEGATIVE NEGATIVE   Leukocytes, UA NEGATIVE NEGATIVE   RBC / HPF TOO NUMEROUS TO COUNT 0 - 5 RBC/hpf   WBC, UA 0-5 0 - 5 WBC/hpf   Bacteria, UA NONE SEEN NONE SEEN   Squamous Epithelial / LPF 0-5 (A) NONE SEEN    Current Facility-Administered Medications  Medication Dose Route Frequency Provider Last Rate Last Dose  . acetaminophen (TYLENOL) tablet 650 mg  650 mg Oral  Q4H PRN Charlesetta Shanks, MD      . asenapine (SAPHRIS) sublingual tablet 5 mg  5 mg Sublingual BID Riggins Cisek, MD   5 mg at 02/21/17 1113  . finasteride (PROSCAR) tablet 5 mg  5 mg Oral Daily Charlesetta Shanks, MD   5 mg at 02/21/17 0934  . hydrochlorothiazide (HYDRODIURIL) tablet 25 mg  25 mg Oral Daily Charlesetta Shanks, MD   25 mg at 02/21/17 0934  . levothyroxine (SYNTHROID, LEVOTHROID) tablet 50 mcg  50 mcg Oral QAC breakfast Charlesetta Shanks, MD   50 mcg at 02/21/17 0934  . nystatin (MYCOSTATIN/NYSTOP) topical powder 2 g  2 g Topical Q12H PRN Charlesetta Shanks, MD      . traZODone (DESYREL) tablet 100 mg  100 mg Oral QHS Charlesetta Shanks, MD   100 mg at 02/20/17 2247   Current Outpatient Prescriptions  Medication Sig Dispense Refill  . azelastine (OPTIVAR) 0.05 % ophthalmic solution Place 1 drop into both eyes 2 (two) times daily.    Marland Kitchen docusate sodium (COLACE) 100 MG capsule Take 100 mg by mouth daily.    Marland Kitchen donepezil (ARICEPT) 10 MG tablet Take 2 tablets (20 mg total) by mouth daily. 180 tablet 1  . escitalopram (LEXAPRO) 10 MG tablet Take 1 tablet (10 mg total) by mouth daily. 30 tablet 6  . fexofenadine (ALLEGRA) 180 MG tablet Take 180 mg by mouth daily.    . finasteride (PROSCAR) 5 MG tablet Take 1 tablet (5 mg total) by mouth as directed. (Patient taking differently: Take 5 mg by mouth daily. )    . hydrochlorothiazide (HYDRODIURIL) 25 MG tablet TAKE 1 TABLET BY MOUTH DAILY (Patient taking differently: TAKE 25 mg BY MOUTH DAILY) 90 tablet 0  . levothyroxine (SYNTHROID, LEVOTHROID) 50 MCG tablet TAKE 1 TABLET BY MOUTH EVERY DAY (Patient taking differently: TAKE 50 mcg BY MOUTH EVERY DAY) 90 tablet 3  . memantine (NAMENDA) 10 MG tablet Take 10 mg by mouth at bedtime.    Marland Kitchen nystatin (MYCOSTATIN/NYSTOP) powder Apply 2 g topically every 12 (twelve) hours as needed (groin rash).    . QUEtiapine (SEROQUEL) 25 MG tablet Take 25 mg by mouth daily. At 0800    . QUEtiapine (SEROQUEL) 50 MG tablet  Take 50 mg by mouth at bedtime. At 2000    . traZODone (DESYREL) 100 MG tablet Take 100 mg by mouth at bedtime.    Marland Kitchen loratadine (CLARITIN) 10 MG tablet Take 1 tablet (10 mg total) by mouth daily. (Patient not taking: Reported on 02/20/2017) 30 tablet 11  . Misc. Devices (COMMODE BEDSIDE) MISC 1 device to use as needed 1 each  0  . Misc. Devices (COMMODE BEDSIDE) MISC Use Device daily as needed. 1 each 0  . Misc. Devices (ROLLATOR ULTRA-LIGHT) MISC 1 Rollator walker to use and needed 1 each 0    Musculoskeletal: Strength & Muscle Tone: within normal limits Gait & Station: normal Patient leans: N/A  Psychiatric Specialty Exam: Physical Exam  Constitutional: He is oriented to person, place, and time. He appears well-developed and well-nourished.  HENT:  Head: Normocephalic.  Neck: Normal range of motion.  Respiratory: Effort normal.  Musculoskeletal: Normal range of motion.  Neurological: He is alert and oriented to person, place, and time.  Psychiatric: His speech is normal. Thought content normal. His mood appears anxious. His affect is labile. Cognition and memory are impaired. He expresses impulsivity. He is inattentive.    Review of Systems  Psychiatric/Behavioral: Positive for memory loss. The patient is nervous/anxious.   All other systems reviewed and are negative.   Blood pressure 118/69, pulse 64, temperature 98 F (36.7 C), temperature source Oral, resp. rate 17, SpO2 93 %.There is no height or weight on file to calculate BMI.  General Appearance: Casual  Eye Contact:  Fair  Speech:  Normal Rate  Volume:  Normal  Mood:  Anxious  Affect:  Congruent  Thought Process:  Coherent  Orientation:  Other:  person and place  Thought Content:  forgetful, memory issues  Suicidal Thoughts:  No  Homicidal Thoughts:  No  Memory:  Immediate;   Fair Recent;   Poor Remote;   Fair  Judgement:  Impaired  Insight:  Fair  Psychomotor Activity:  Decreased  Concentration:  Concentration:  Fair and Attention Span: Fair  Recall:  [employer] of Knowledge:  Fair  Language:  Good  Akathisia:  none  Handed:  Right  AIMS (if indicated):     Assets:  Housing Leisure Time Physical Health Resilience Social Support  ADL's:  Impaired  Cognition:  Impaired,  Mild  Sleep:        Treatment Plan Summary: Daily contact with patient to assess and evaluate symptoms and progress in treatment, Medication management and Plan dementia with behavioral disturbance, dementia of unknown source:  -Crisis stabilization -Medication management:  Continued medical medications along with Trazodone 100 mg at bedtime for sleep. STarted Saphris 5 mg BID for mood and agitation -Individual counseling  Disposition: Recommend psychiatric Inpatient admission when medically cleared.  Waylan Boga, NP 02/21/2017 11:47 AM  Patient seen face-to-face for psychiatric evaluation, chart reviewed and case discussed with the physician extender and developed treatment plan. Reviewed the information documented and agree with the treatment plan. Corena Pilgrim, MD

## 2017-02-21 NOTE — ED Notes (Signed)
Pt stated "I'm from [place].  I live in [place].  I'm going to bed soon."  Pt denies knowing why he's here, is alert, oriented to year & self only.

## 2017-02-21 NOTE — BH Assessment (Signed)
[facility] Assessment Progress Note  Per Corena Pilgrim, MD, this pt requires psychiatric hospitalization at this time.  Pt presents under IVC initiated by Dr Darleene Cleaver.  The following facilities have been contacted to seek placement for this pt, with results as noted:  Beds available, information sent, decision pending:  Osborne Oman, [place] Triage Specialist [phone]

## 2017-02-22 DIAGNOSIS — G308 Other Alzheimer's disease: Secondary | ICD-10-CM

## 2017-02-22 DIAGNOSIS — G4700 Insomnia, unspecified: Secondary | ICD-10-CM | POA: Diagnosis not present

## 2017-02-22 DIAGNOSIS — R413 Other amnesia: Secondary | ICD-10-CM | POA: Diagnosis not present

## 2017-02-22 DIAGNOSIS — R4689 Other symptoms and signs involving appearance and behavior: Secondary | ICD-10-CM | POA: Insufficient documentation

## 2017-02-22 DIAGNOSIS — Z87891 Personal history of nicotine dependence: Secondary | ICD-10-CM | POA: Diagnosis not present

## 2017-02-22 DIAGNOSIS — Z810 Family history of intellectual disabilities: Secondary | ICD-10-CM | POA: Diagnosis not present

## 2017-02-22 DIAGNOSIS — F6089 Other specific personality disorders: Secondary | ICD-10-CM

## 2017-02-22 DIAGNOSIS — F0281 Dementia in other diseases classified elsewhere with behavioral disturbance: Secondary | ICD-10-CM | POA: Diagnosis not present

## 2017-02-22 NOTE — Progress Notes (Signed)
Per Abagail Kitchens at [facility], unable to offer a bed until they can verify his social security number.   Lind Covert, MSW, [place] TTS Specialist [phone]

## 2017-02-22 NOTE — Consult Note (Signed)
[facility] Face-to-Face Psychiatry Consult   Reason for Consult:  Aggressive behavior Referring Physician:  EDP Patient Identification: Louis Patel MRN:  [id number] Principal Diagnosis: Dementia with behavioral disturbance Diagnosis:   Patient Active Problem List   Diagnosis Date Noted  . Aggressive behavior of adult [F60.89]   . Dementia with behavioral disturbance [F03.91] 02/21/2017  . Mild dementia [F03.90] 06/06/2016  . Hypothyroidism [E03.9] 01/25/2016  . UTI (urinary tract infection) [N39.0] 06/19/2015  . Obesity (BMI 30-39.9) [E66.9] 02/22/2013  . Memory loss [R41.3] 01/22/2011  . MOLE [D23.9] 05/22/2010  . DEPRESSIVE DISORDER [F32.9] 05/22/2010  . ALLERGIC RHINITIS CAUSE UNSPECIFIED [J30.9] 10/17/2008  . PSA, INCREASED [R97.20] 05/23/2008  . Hyperlipidemia [E78.5] 02/26/2007  . Essential hypertension [I10] 02/26/2007  . HEMORRHOIDS, EXTERNAL [K64.4] 02/26/2007    Total Time spent with patient: 30 minutes  Subjective:   Louis Patel is a 83 y.o. male patient admitted with aggressive behavior.  HPI: Pt was seen and chart reviewed. 83 yo male who was brought to the ED after having an increase in aggression to the point of threatening to punch an individual in the face.  Today, he is cooperative and his daughter, [name], [place], gave collateral information.  His medications were changed recently and his aggression increased.  No suicidal/homicidal ideations but threatens others frequently.  Inpatient hospitalization needed  Past Psychiatric History: As above  Risk to Self: Suicidal Ideation: No Suicidal Intent: No Is patient at risk for suicide?: No Suicidal Plan?: No Access to Means: No What has been your use of drugs/alcohol within the last 12 months?: n/a How many times?: 0 Other Self Harm Risks: 0 Intentional Self Injurious Behavior: None Risk to Others: Homicidal Ideation: No Thoughts of Harm to Others: Yes-Currently Present Comment - Thoughts of Harm  to Others: verbally aggressive toward people at care home, and threatening Current Homicidal Intent: No Current Homicidal Plan: No Access to Homicidal Means: No History of harm to others?: No Assessment of Violence: None Noted Violent Behavior Description: n/a Does patient have access to weapons?: No Criminal Charges Pending?: No Does patient have a court date: No Prior Inpatient Therapy: Prior Inpatient Therapy: No Prior Outpatient Therapy: Prior Outpatient Therapy: No Does patient have an ACCT team?: No Does patient have Intensive In-House Services?  : No Does patient have [facility] services? : No Does patient have P4CC services?: No  Past Medical History:  Past Medical History:  Diagnosis Date  . Colon polyps   . Dementia   . Diverticulosis   . Enlarged prostate   . Hyperlipidemia   . Hypertension   . Hypothyroidism     Past Surgical History:  Procedure Laterality Date  . CATARACT EXTRACTION Left 07/18/08   left cataract extraction  . CATARACT EXTRACTION Right   . COLONOSCOPY    . PROSTATE SURGERY  3/08   Prostatectomy  . TONSILLECTOMY     Family History:  Family History  Problem Relation Age of Onset  . Stroke Mother   . Hypertension Mother   . Esophageal cancer Brother   . Heart disease Unknown   . Stroke Sister   . Dementia Brother   . Dementia Brother   . Dementia Sister    Family Psychiatric  History: Unknown  Social History:  History  Alcohol Use No     History  Drug Use No    Social History   Social History  . Marital status: Widowed    Spouse name: N/A  . Number of children:  3  . Years of education: N/A   Occupational History  . retired    Social History Main Topics  . Smoking status: Former Smoker    Packs/day: 1.00    Years: 20.00    Types: Cigarettes    Quit date: 08/24/1962  . Smokeless tobacco: Never Used  . Alcohol use No  . Drug use: No  . Sexual activity: Not Currently    Partners: Female   Other Topics Concern  . None    Social History Narrative  . None   Additional Social History:    Allergies:  No Known Allergies  Labs:  Results for orders placed or performed during the hospital encounter of 02/20/17 (from the past 48 hour(s))  Comprehensive metabolic panel     Status: Abnormal   Collection Time: 02/20/17  4:11 PM  Result Value Ref Range   Sodium 139 135 - 145 mmol/L   Potassium 3.8 3.5 - 5.1 mmol/L   Chloride 102 101 - 111 mmol/L   CO2 31 22 - 32 mmol/L   Glucose, Bld 100 (H) 65 - 99 mg/dL   BUN 23 (H) 6 - 20 mg/dL   Creatinine, Ser 1.00 0.61 - 1.24 mg/dL   Calcium 8.8 (L) 8.9 - 10.3 mg/dL   Total Protein 6.3 (L) 6.5 - 8.1 g/dL   Albumin 3.5 3.5 - 5.0 g/dL   AST 17 15 - 41 U/L   ALT 14 (L) 17 - 63 U/L   Alkaline Phosphatase 61 38 - 126 U/L   Total Bilirubin 0.5 0.3 - 1.2 mg/dL   GFR calc non Af Amer >60 >60 mL/min   GFR calc Af Amer >60 >60 mL/min    Comment: (NOTE) The eGFR has been calculated using the CKD EPI equation. This calculation has not been validated in all clinical situations. eGFR's persistently <60 mL/min signify possible Chronic Kidney Disease.    Anion gap 6 5 - 15  Ethanol     Status: None   Collection Time: 02/20/17  4:11 PM  Result Value Ref Range   Alcohol, Ethyl (B) <5 <5 mg/dL    Comment:        LOWEST DETECTABLE LIMIT FOR SERUM ALCOHOL IS 5 mg/dL FOR MEDICAL PURPOSES ONLY   Acetaminophen level     Status: Abnormal   Collection Time: 02/20/17  4:11 PM  Result Value Ref Range   Acetaminophen (Tylenol), Serum <10 (L) 10 - 30 ug/mL    Comment:        THERAPEUTIC CONCENTRATIONS VARY SIGNIFICANTLY. A RANGE OF 10-30 ug/mL MAY BE AN EFFECTIVE CONCENTRATION FOR MANY PATIENTS. HOWEVER, SOME ARE BEST TREATED AT CONCENTRATIONS OUTSIDE THIS RANGE. ACETAMINOPHEN CONCENTRATIONS >150 ug/mL AT 4 HOURS AFTER INGESTION AND >50 ug/mL AT 12 HOURS AFTER INGESTION ARE OFTEN ASSOCIATED WITH TOXIC REACTIONS.   Brain natriuretic peptide     Status: None    Collection Time: 02/20/17  4:11 PM  Result Value Ref Range   B Natriuretic Peptide 94.9 0.0 - 427.0 pg/mL  Salicylate level     Status: None   Collection Time: 02/20/17  4:11 PM  Result Value Ref Range   Salicylate Lvl <6.2 2.8 - 30.0 mg/dL  CBC with Differential     Status: None   Collection Time: 02/20/17  4:11 PM  Result Value Ref Range   WBC 7.2 4.0 - 10.5 K/uL   RBC 4.72 4.22 - 5.81 MIL/uL   Hemoglobin 13.3 13.0 - 17.0 g/dL   HCT 40.2 39.0 - 52.0 %  MCV 85.2 78.0 - 100.0 fL   MCH 28.2 26.0 - 34.0 pg   MCHC 33.1 30.0 - 36.0 g/dL   RDW 14.9 11.5 - 15.5 %   Platelets 203 150 - 400 K/uL   Neutrophils Relative % 58 %   Neutro Abs 4.3 1.7 - 7.7 K/uL   Lymphocytes Relative 25 %   Lymphs Abs 1.8 0.7 - 4.0 K/uL   Monocytes Relative 10 %   Monocytes Absolute 0.7 0.1 - 1.0 K/uL   Eosinophils Relative 7 %   Eosinophils Absolute 0.5 0.0 - 0.7 K/uL   Basophils Relative 0 %   Basophils Absolute 0.0 0.0 - 0.1 K/uL  Urinalysis, Routine w reflex microscopic     Status: Abnormal   Collection Time: 02/21/17 10:38 AM  Result Value Ref Range   Color, Urine YELLOW YELLOW   APPearance CLEAR CLEAR   Specific Gravity, Urine 1.010 1.005 - 1.030   pH 6.0 5.0 - 8.0   Glucose, UA NEGATIVE NEGATIVE mg/dL   Hgb urine dipstick MODERATE (A) NEGATIVE   Bilirubin Urine NEGATIVE NEGATIVE   Ketones, ur NEGATIVE NEGATIVE mg/dL   Protein, ur NEGATIVE NEGATIVE mg/dL   Nitrite NEGATIVE NEGATIVE   Leukocytes, UA NEGATIVE NEGATIVE   RBC / HPF TOO NUMEROUS TO COUNT 0 - 5 RBC/hpf   WBC, UA 0-5 0 - 5 WBC/hpf   Bacteria, UA NONE SEEN NONE SEEN   Squamous Epithelial / LPF 0-5 (A) NONE SEEN    Current Facility-Administered Medications  Medication Dose Route Frequency Provider Last Rate Last Dose  . acetaminophen (TYLENOL) tablet 650 mg  650 mg Oral Q4H PRN Charlesetta Shanks, MD      . asenapine (SAPHRIS) sublingual tablet 5 mg  5 mg Sublingual BID Darleene Cleaver, Mojeed, MD   5 mg at 02/22/17 0914  . finasteride  (PROSCAR) tablet 5 mg  5 mg Oral Daily Charlesetta Shanks, MD   5 mg at 02/22/17 0913  . hydrochlorothiazide (HYDRODIURIL) tablet 25 mg  25 mg Oral Daily Charlesetta Shanks, MD   25 mg at 02/22/17 0913  . levothyroxine (SYNTHROID, LEVOTHROID) tablet 50 mcg  50 mcg Oral QAC breakfast Charlesetta Shanks, MD   50 mcg at 02/22/17 0748  . nystatin (MYCOSTATIN/NYSTOP) topical powder 2 g  2 g Topical Q12H PRN Charlesetta Shanks, MD      . traZODone (DESYREL) tablet 100 mg  100 mg Oral QHS Charlesetta Shanks, MD   100 mg at 02/21/17 2205   Current Outpatient Prescriptions  Medication Sig Dispense Refill  . azelastine (OPTIVAR) 0.05 % ophthalmic solution Place 1 drop into both eyes 2 (two) times daily.    Marland Kitchen docusate sodium (COLACE) 100 MG capsule Take 100 mg by mouth daily.    Marland Kitchen donepezil (ARICEPT) 10 MG tablet Take 2 tablets (20 mg total) by mouth daily. 180 tablet 1  . escitalopram (LEXAPRO) 10 MG tablet Take 1 tablet (10 mg total) by mouth daily. 30 tablet 6  . fexofenadine (ALLEGRA) 180 MG tablet Take 180 mg by mouth daily.    . finasteride (PROSCAR) 5 MG tablet Take 1 tablet (5 mg total) by mouth as directed. (Patient taking differently: Take 5 mg by mouth daily. )    . hydrochlorothiazide (HYDRODIURIL) 25 MG tablet TAKE 1 TABLET BY MOUTH DAILY (Patient taking differently: TAKE 25 mg BY MOUTH DAILY) 90 tablet 0  . levothyroxine (SYNTHROID, LEVOTHROID) 50 MCG tablet TAKE 1 TABLET BY MOUTH EVERY DAY (Patient taking differently: TAKE 50 mcg BY MOUTH EVERY DAY)  90 tablet 3  . memantine (NAMENDA) 10 MG tablet Take 10 mg by mouth at bedtime.    Marland Kitchen nystatin (MYCOSTATIN/NYSTOP) powder Apply 2 g topically every 12 (twelve) hours as needed (groin rash).    . QUEtiapine (SEROQUEL) 25 MG tablet Take 25 mg by mouth daily. At 0800    . QUEtiapine (SEROQUEL) 50 MG tablet Take 50 mg by mouth at bedtime. At 2000    . traZODone (DESYREL) 100 MG tablet Take 100 mg by mouth at bedtime.    Marland Kitchen loratadine (CLARITIN) 10 MG tablet Take 1  tablet (10 mg total) by mouth daily. (Patient not taking: Reported on 02/20/2017) 30 tablet 11  . Misc. Devices (COMMODE BEDSIDE) MISC 1 device to use as needed 1 each 0  . Misc. Devices (COMMODE BEDSIDE) MISC Use Device daily as needed. 1 each 0  . Misc. Devices (ROLLATOR ULTRA-LIGHT) MISC 1 Rollator walker to use and needed 1 each 0    Musculoskeletal: Strength & Muscle Tone: within normal limits Gait & Station: broad based Patient leans: N/A  Psychiatric Specialty Exam: Physical Exam  Constitutional: He appears well-developed and well-nourished.  Respiratory: Effort normal.  Musculoskeletal: Normal range of motion.  Neurological: He is alert.    Review of Systems  Psychiatric/Behavioral: Positive for depression and memory loss (dementia). Negative for hallucinations, substance abuse and suicidal ideas. The patient has insomnia. The patient is not nervous/anxious.   All other systems reviewed and are negative.   Blood pressure 135/61, pulse 60, temperature 98 F (36.7 C), temperature source Oral, resp. rate 15, SpO2 95 %.There is no height or weight on file to calculate BMI.  General Appearance: Disheveled  Eye Contact:  Fair  Speech:  Clear and Coherent and Slow  Volume:  Normal  Mood:  Depressed and dementia  Affect:  Depressed and dementia  Thought Process:  Coherent  Orientation:  Other:  person, place and age  Thought Content:  dementia  Suicidal Thoughts:  No  Homicidal Thoughts:  No  Memory:  Immediate;   Fair Recent;   Fair Remote;   Fair  Judgement:  Poor  Insight:  Shallow  Psychomotor Activity:  Normal  Concentration:  Concentration: Fair and Attention Span: Fair  Recall:  dementia  Fund of Knowledge:  dementia  Language:  Good  Akathisia:  No  Handed:  Right  AIMS (if indicated):     Assets:  Financial Resources/Insurance Housing  ADL's:  Intact  Cognition:  WNL  Sleep:        Treatment Plan Summary: Daily contact with patient to assess and  evaluate symptoms and progress in treatment and Medication management (see MAR)  Disposition: Recommend psychiatric Inpatient admission when medically cleared.  TTS to seek appropriate placement  Ethelene Hal, NP 02/22/2017 3:26 PM

## 2017-02-22 NOTE — ED Notes (Signed)
TTS notified that staff from [facility] are calling to take about pt placement.

## 2017-02-22 NOTE — ED Notes (Signed)
Pt with incontinent urine episode; pt yelling @ sitter & refusing to allow to be changed.

## 2017-02-22 NOTE — ED Notes (Signed)
Louis Patel [phone] patient's daughter called.  She has medical power of attorney.  Advised daughter to bring copy in for patient's chart.  She wants to be called if patient gets placement or if his status changes in any way.

## 2017-02-22 NOTE — Progress Notes (Signed)
Per psychiatrist Jonnalagadda and NP Romilda Garret, patient continues to meet inpatient criteria. CSW faxed patient's referral to: [facility], [place], [place], [place], [facility], [facility] and [place].   Abundio Miu, [facility]  Clinical Social Worker [phone]

## 2017-02-22 NOTE — ED Notes (Signed)
Patient yelling now.  "I want out of this place."  Patient was incontinent of urine while sitting in his bedside chair.  Sitter assisting him with a bath.

## 2017-02-23 ENCOUNTER — Encounter (HOSPITAL_COMMUNITY): Payer: Self-pay | Admitting: Emergency Medicine

## 2017-02-23 NOTE — ED Notes (Signed)
Sheriff at bedside.  

## 2017-02-23 NOTE — ED Notes (Signed)
Patient screaming and yelling "stupid bitch" at sitter.

## 2017-02-23 NOTE — ED Provider Notes (Signed)
Pt accepted at [facility] by Dr. Kerin Salen.  Patient reexamined and is stable for d/c.  Right now, he is calm and cooperative and is happy to be going to another hospital.   Isla Pence, MD 02/23/17 1038

## 2017-02-23 NOTE — Progress Notes (Signed)
Melissa with [facility] inquired if patient still needed placement.  Per clinical social worker, Joelene Millin, patient still needs placement.   [facility] was advised.  Per [facility], patient has been accepted to [facility]. Accepting provider is Dr. Frederik Pear. Please call report when patient is ready to leave. Nursing report: [phone]. [facility] RN Verna Czech was notified.  Verlon Setting, MSW, LCSWA Clinical social worker in [place], [place] Office

## 2017-02-23 NOTE — ED Notes (Signed)
Report given to [facility]

## 2017-03-19 ENCOUNTER — Telehealth: Payer: Self-pay | Admitting: *Deleted

## 2017-03-19 NOTE — Telephone Encounter (Signed)
Received refill request for [facility] patient via [facility] for Docusate Sodium Softgel 100mg  capsule [Colace], take [1] cap Po QD, requires provider signature; forwarded to provider/SLS

## 2017-03-20 NOTE — Telephone Encounter (Signed)
Received refill request for [facility] patient via [facility] for Azelastine HCL 0.05% Drops [Optivar], instill  [1] drop into both eyes QD, requires provider signature; forwarded to provider/SLS 09/13

## 2017-04-07 ENCOUNTER — Other Ambulatory Visit: Payer: Self-pay

## 2017-04-07 MED ORDER — LEVOTHYROXINE SODIUM 50 MCG PO TABS: 50.0000 ug | ORAL_TABLET | Freq: Every day | ORAL | 3 refills | Status: DC

## 2017-05-08 ENCOUNTER — Encounter (HOSPITAL_COMMUNITY): Payer: Self-pay | Admitting: Emergency Medicine

## 2017-05-08 ENCOUNTER — Inpatient Hospital Stay (HOSPITAL_COMMUNITY)
Admission: EM | Admit: 2017-05-08 | Discharge: 2017-05-12 | DRG: 152 | Disposition: A | Discharge status: Home / Self Care | Payer: Medicare Other | Attending: Internal Medicine | Admitting: Internal Medicine

## 2017-05-08 ENCOUNTER — Emergency Department (HOSPITAL_COMMUNITY): Payer: Medicare Other

## 2017-05-08 DIAGNOSIS — Z8249 Family history of ischemic heart disease and other diseases of the circulatory system: Secondary | ICD-10-CM

## 2017-05-08 DIAGNOSIS — Z79899 Other long term (current) drug therapy: Secondary | ICD-10-CM

## 2017-05-08 DIAGNOSIS — J069 Acute upper respiratory infection, unspecified: Principal | ICD-10-CM | POA: Diagnosis present

## 2017-05-08 DIAGNOSIS — N179 Acute kidney failure, unspecified: Secondary | ICD-10-CM

## 2017-05-08 DIAGNOSIS — B338 Other specified viral diseases: Secondary | ICD-10-CM | POA: Diagnosis not present

## 2017-05-08 DIAGNOSIS — F0391 Unspecified dementia with behavioral disturbance: Secondary | ICD-10-CM | POA: Diagnosis present

## 2017-05-08 DIAGNOSIS — Z87891 Personal history of nicotine dependence: Secondary | ICD-10-CM

## 2017-05-08 DIAGNOSIS — G301 Alzheimer's disease with late onset: Secondary | ICD-10-CM

## 2017-05-08 DIAGNOSIS — Z66 Do not resuscitate: Secondary | ICD-10-CM | POA: Diagnosis present

## 2017-05-08 DIAGNOSIS — G9340 Encephalopathy, unspecified: Secondary | ICD-10-CM

## 2017-05-08 DIAGNOSIS — E669 Obesity, unspecified: Secondary | ICD-10-CM | POA: Diagnosis present

## 2017-05-08 DIAGNOSIS — E785 Hyperlipidemia, unspecified: Secondary | ICD-10-CM | POA: Diagnosis present

## 2017-05-08 DIAGNOSIS — I10 Essential (primary) hypertension: Secondary | ICD-10-CM | POA: Diagnosis present

## 2017-05-08 DIAGNOSIS — J189 Pneumonia, unspecified organism: Secondary | ICD-10-CM | POA: Diagnosis present

## 2017-05-08 DIAGNOSIS — F03A0 Unspecified dementia, mild, without behavioral disturbance, psychotic disturbance, mood disturbance, and anxiety: Secondary | ICD-10-CM | POA: Diagnosis present

## 2017-05-08 DIAGNOSIS — Z86010 Personal history of colonic polyps: Secondary | ICD-10-CM | POA: Diagnosis not present

## 2017-05-08 DIAGNOSIS — F0281 Dementia in other diseases classified elsewhere with behavioral disturbance: Secondary | ICD-10-CM

## 2017-05-08 DIAGNOSIS — R972 Elevated prostate specific antigen [PSA]: Secondary | ICD-10-CM | POA: Diagnosis present

## 2017-05-08 DIAGNOSIS — E876 Hypokalemia: Secondary | ICD-10-CM | POA: Diagnosis present

## 2017-05-08 DIAGNOSIS — E860 Dehydration: Secondary | ICD-10-CM | POA: Diagnosis not present

## 2017-05-08 DIAGNOSIS — D649 Anemia, unspecified: Secondary | ICD-10-CM | POA: Diagnosis present

## 2017-05-08 DIAGNOSIS — R531 Weakness: Secondary | ICD-10-CM

## 2017-05-08 DIAGNOSIS — J9601 Acute respiratory failure with hypoxia: Secondary | ICD-10-CM | POA: Diagnosis present

## 2017-05-08 DIAGNOSIS — F03918 Unspecified dementia, unspecified severity, with other behavioral disturbance: Secondary | ICD-10-CM | POA: Diagnosis present

## 2017-05-08 DIAGNOSIS — E871 Hypo-osmolality and hyponatremia: Secondary | ICD-10-CM | POA: Diagnosis present

## 2017-05-08 DIAGNOSIS — B348 Other viral infections of unspecified site: Secondary | ICD-10-CM | POA: Diagnosis present

## 2017-05-08 DIAGNOSIS — J9600 Acute respiratory failure, unspecified whether with hypoxia or hypercapnia: Secondary | ICD-10-CM

## 2017-05-08 DIAGNOSIS — Z6833 Body mass index (BMI) 33.0-33.9, adult: Secondary | ICD-10-CM

## 2017-05-08 DIAGNOSIS — F0390 Unspecified dementia without behavioral disturbance: Secondary | ICD-10-CM | POA: Diagnosis present

## 2017-05-08 LAB — COMPREHENSIVE METABOLIC PANEL
ALBUMIN: 3.3 g/dL — AB (ref 3.5–5.0)
ALT: 25 U/L (ref 17–63)
AST: 35 U/L (ref 15–41)
Alkaline Phosphatase: 60 U/L (ref 38–126)
Anion gap: 14 (ref 5–15)
BUN: 24 mg/dL — ABNORMAL HIGH (ref 6–20)
CALCIUM: 8.6 mg/dL — AB (ref 8.9–10.3)
CHLORIDE: 96 mmol/L — AB (ref 101–111)
CO2: 26 mmol/L (ref 22–32)
Creatinine, Ser: 1.47 mg/dL — ABNORMAL HIGH (ref 0.61–1.24)
GFR calc non Af Amer: 40 mL/min — ABNORMAL LOW (ref >60)
GFR, EST AFRICAN AMERICAN: 47 mL/min — AB (ref >60)
GLUCOSE: 132 mg/dL — AB (ref 65–99)
POTASSIUM: 3.9 mmol/L (ref 3.5–5.1)
Sodium: 136 mmol/L (ref 135–145)
Total Bilirubin: 1.4 mg/dL — ABNORMAL HIGH (ref 0.3–1.2)
Total Protein: 6.6 g/dL (ref 6.5–8.1)

## 2017-05-08 LAB — DIFFERENTIAL
BASOS PCT: 0 %
Basophils Absolute: 0 10*3/uL (ref 0.0–0.1)
EOS ABS: 0 10*3/uL (ref 0.0–0.7)
Eosinophils Relative: 0 %
LYMPHS PCT: 15 %
Lymphs Abs: 1.2 10*3/uL (ref 0.7–4.0)
MONO ABS: 0.6 10*3/uL (ref 0.1–1.0)
Monocytes Relative: 7 %
NEUTROS PCT: 78 %
Neutro Abs: 6.4 10*3/uL (ref 1.7–7.7)

## 2017-05-08 LAB — CBC
HCT: 45.4 % (ref 39.0–52.0)
Hemoglobin: 15.3 g/dL (ref 13.0–17.0)
MCH: 29.7 pg (ref 26.0–34.0)
MCHC: 33.7 g/dL (ref 30.0–36.0)
MCV: 88 fL (ref 78.0–100.0)
PLATELETS: 182 10*3/uL (ref 150–400)
RBC: 5.16 MIL/uL (ref 4.22–5.81)
RDW: 16.3 % — AB (ref 11.5–15.5)
WBC: 8.2 10*3/uL (ref 4.0–10.5)

## 2017-05-08 LAB — I-STAT CHEM 8, ED
BUN: 34 mg/dL — ABNORMAL HIGH (ref 6–20)
Calcium, Ion: 0.96 mmol/L — ABNORMAL LOW (ref 1.15–1.40)
Chloride: 97 mmol/L — ABNORMAL LOW (ref 101–111)
Creatinine, Ser: 1.4 mg/dL — ABNORMAL HIGH (ref 0.61–1.24)
GLUCOSE: 133 mg/dL — AB (ref 65–99)
HCT: 47 % (ref 39.0–52.0)
HEMOGLOBIN: 16 g/dL (ref 13.0–17.0)
POTASSIUM: 4.5 mmol/L (ref 3.5–5.1)
SODIUM: 136 mmol/L (ref 135–145)
TCO2: 30 mmol/L (ref 22–32)

## 2017-05-08 LAB — I-STAT TROPONIN, ED: Troponin i, poc: 0.01 ng/mL (ref 0.00–0.08)

## 2017-05-08 LAB — CBG MONITORING, ED: GLUCOSE-CAPILLARY: 131 mg/dL — AB (ref 65–99)

## 2017-05-08 LAB — APTT: aPTT: 31 seconds (ref 24–36)

## 2017-05-08 LAB — PROTIME-INR
INR: 1.05
PROTHROMBIN TIME: 13.7 s (ref 11.4–15.2)

## 2017-05-08 MED ORDER — ONDANSETRON HCL 4 MG PO TABS: 4.0000 mg | ORAL_TABLET | Freq: Four times a day (QID) | ORAL | Status: DC | PRN

## 2017-05-08 MED ORDER — AZITHROMYCIN 500 MG IV SOLR
500.0000 mg | INTRAVENOUS | Status: DC
  Administered 2017-05-09 (×2): 500 mg via INTRAVENOUS
  Filled 2017-05-08 (×2): qty 500

## 2017-05-08 MED ORDER — SODIUM CHLORIDE 0.9 % IV SOLN
INTRAVENOUS | Status: DC
  Administered 2017-05-09 – 2017-05-12 (×4): via INTRAVENOUS

## 2017-05-08 MED ORDER — ENOXAPARIN SODIUM 40 MG/0.4ML ~~LOC~~ SOLN
40.0000 mg | SUBCUTANEOUS | Status: DC
  Administered 2017-05-09 – 2017-05-12 (×4): 40 mg via SUBCUTANEOUS
  Filled 2017-05-08 (×4): qty 0.4

## 2017-05-08 MED ORDER — ACETAMINOPHEN 650 MG RE SUPP
650.0000 mg | Freq: Four times a day (QID) | RECTAL | Status: DC | PRN
  Administered 2017-05-09: 650 mg via RECTAL
  Filled 2017-05-08: qty 1

## 2017-05-08 MED ORDER — ONDANSETRON HCL 4 MG/2ML IJ SOLN: 4.0000 mg | Freq: Four times a day (QID) | INTRAMUSCULAR | Status: DC | PRN

## 2017-05-08 MED ORDER — DEXTROSE 5 % IV SOLN
1.0000 g | INTRAVENOUS | Status: DC
  Administered 2017-05-09: 1 g via INTRAVENOUS
  Filled 2017-05-08: qty 10

## 2017-05-08 MED ORDER — DEXTROSE 5 % IV SOLN
1.0000 g | Freq: Once | INTRAVENOUS | Status: AC
  Administered 2017-05-09: 1 g via INTRAVENOUS
  Filled 2017-05-08: qty 10

## 2017-05-08 MED ORDER — LEVOFLOXACIN IN D5W 750 MG/150ML IV SOLN
750.0000 mg | Freq: Once | INTRAVENOUS | Status: DC
  Filled 2017-05-08: qty 150

## 2017-05-08 MED ORDER — ACETAMINOPHEN 325 MG PO TABS: 650.0000 mg | ORAL_TABLET | Freq: Four times a day (QID) | ORAL | Status: DC | PRN

## 2017-05-08 NOTE — ED Notes (Signed)
X-ray at bedside

## 2017-05-08 NOTE — Consult Note (Addendum)
Neurology Consultation Reason for Consult: Code stroke Referring Physician: Jeanell Sparrow, D  CC: Code stroke  History is obtained from: Patient, EMS  HPI: Louis Patel is a 83 y.o. male with a history of dementia, recently diagnosed with pneumonia who presents with altered mental status.  He was seen relatively normal at dinner around 5:30 PM, then around 6 he was seen to be slumped over to the right side when trying to get onto the toilet.  He was found to be hypoxic by EMS on arrival with sats in the 80s, but responded well to oxygen.  He was brought into the emergency department as a code stroke due to altered mental status, but on arrival was essentially nonfocal other than confusion.   LKW: 5:30 pm tpa given?: no, mild symptoms   ROS: Unable to obtain due to altered mental status.   Past Medical History:  Diagnosis Date  . Colon polyps   . Dementia   . Diverticulosis   . Enlarged prostate   . Hyperlipidemia   . Hypertension   . Hypothyroidism      Family History  Problem Relation Age of Onset  . Stroke Mother   . Hypertension Mother   . Esophageal cancer Brother   . Heart disease Unknown   . Stroke Sister   . Dementia Brother   . Dementia Brother   . Dementia Sister      Social History:  reports that he quit smoking about 54 years ago. His smoking use included Cigarettes. He has a 20.00 pack-year smoking history. He has never used smokeless tobacco. He reports that he does not drink alcohol or use drugs.   Exam: Current vital signs: BP 106/77   Pulse (!) 103   Temp (S) (!) 100.5 F (38.1 C) (Rectal)   Resp (!) 21   Ht 5\' 6"  (1.676 m)   SpO2 100%  Vital signs in last 24 hours: Temp:  [98.7 F (37.1 C)-100.5 F (38.1 C)] 100.5 F (38.1 C) (11/01 2203) Pulse Rate:  [103-117] 103 (11/01 2145) Resp:  [21] 21 (11/01 2145) BP: (105-106)/(77-79) 106/77 (11/01 2145) SpO2:  [84 %-100 %] 100 % (11/01 2145)   Physical Exam  Constitutional: Appears  well-developed and well-nourished.  Psych: Affect appropriate to situation Eyes: No scleral injection HENT: No OP obstrucion Head: Normocephalic.  Cardiovascular: Normal rate and regular rhythm.  Respiratory: Effort normal and breath sounds normal to anterior ascultation GI: Soft.  No distension. There is no tenderness.  Skin: WDI  Neuro: Mental Status: Patient is awake, alert, does not give correct year, but does give correct month. Cranial Nerves: II: Visual Fields are full. Pupils are equal, round, and reactive to light.   III,IV, VI: EOMI without ptosis or diploplia.  V: Facial sensation is symmetric to temperature VII: Facial movement is symmetric.  VIII: hearing is intact to voice X: Uvula elevates symmetrically XI: Shoulder shrug is symmetric. XII: tongue is midline without atrophy or fasciculations.  Motor: Tone is normal. Bulk is normal. 5/5 strength was present in all four extremities.  When lifting his left leg which is wrapped in bandage, he lets it drop with give way type weakness Sensory: Sensation is symmetric to light touch and temperature in the arms and legs. Cerebellar: FNF  intact bilaterally, does not perform HKS   I have reviewed labs in epic and the results pertinent to this consultation are:   I have reviewed the images obtained: CT head-negative  Impression: 83 year old male with recently  diagnosed pneumonia in the setting of dementia(lives in a memory care unit.)  I suspect toxic metabolic encephalopathy.  At this time, I do not have any reason to suspect a primary neurological process.  If he continues to have problems then an MRI could be obtained, but my suspicion for stroke is very low at this time.  Recommendations: 1) ammonia, TSH, treatment per internal medicine 2) no further recommendations at this time, please call with further questions or concerns.   Roland Rack, MD [facility] [phone]  If 7pm- 7am, please  page neurology on call as listed in [place].

## 2017-05-08 NOTE — ED Provider Notes (Signed)
[facility] EMERGENCY DEPARTMENT Provider Note  CSN: [id number] Arrival date & time: 05/08/17  2102 History   Chief Complaint No chief complaint on file.  HPI Louis Patel is a 83 y.o. male who presents from [facility] his assisted living facility after he was found slumped over and in respiratory distress.  The history is provided by the EMS personnel.  Neurologic Problem  This is a new problem. The current episode started 3 to 5 hours ago. The problem has not changed since onset.Pertinent negatives include no chest pain, no abdominal pain and no shortness of breath. Associated symptoms comments: Slurred speech, generalized weakness. Nothing aggravates the symptoms. Nothing relieves the symptoms.  Shortness of Breath  This is a new problem. The average episode lasts 1 day. The current episode started yesterday. The problem has been gradually worsening. Associated symptoms include a fever, cough and sputum production. Pertinent negatives include no sore throat, no ear pain, no chest pain, no vomiting, no abdominal pain and no rash. He has tried nothing for the symptoms.   Past Medical History:  Diagnosis Date  . Colon polyps   . Dementia   . Diverticulosis   . Enlarged prostate   . Hyperlipidemia   . Hypertension   . Hypothyroidism    Patient Active Problem List   Diagnosis Date Noted  . Aggressive behavior of adult   . Dementia with behavioral disturbance 02/21/2017  . Mild dementia 06/06/2016  . Hypothyroidism 01/25/2016  . UTI (urinary tract infection) 06/19/2015  . Obesity (BMI 30-39.9) 02/22/2013  . Memory loss 01/22/2011  . MOLE 05/22/2010  . DEPRESSIVE DISORDER 05/22/2010  . ALLERGIC RHINITIS CAUSE UNSPECIFIED 10/17/2008  . PSA, INCREASED 05/23/2008  . Hyperlipidemia 02/26/2007  . Essential hypertension 02/26/2007  . HEMORRHOIDS, EXTERNAL 02/26/2007   Past Surgical History:  Procedure Laterality Date  . CATARACT EXTRACTION Left 07/18/08   left  cataract extraction  . CATARACT EXTRACTION Right   . COLONOSCOPY    . PROSTATE SURGERY  3/08   Prostatectomy  . TONSILLECTOMY      Home Medications    Prior to Admission medications   Medication Sig Start Date End Date Taking? Authorizing Provider  azelastine (OPTIVAR) 0.05 % ophthalmic solution Place 1 drop into both eyes 2 (two) times daily.    [provider]  docusate sodium (COLACE) 100 MG capsule Take 100 mg by mouth daily.    [provider]  donepezil (ARICEPT) 10 MG tablet Take 2 tablets (20 mg total) by mouth daily. 02/01/16   Roma Schanz R, DO  escitalopram (LEXAPRO) 10 MG tablet Take 1 tablet (10 mg total) by mouth daily. 02/21/16   Cameron Sprang, MD  fexofenadine (ALLEGRA) 180 MG tablet Take 180 mg by mouth daily.    [provider]  finasteride (PROSCAR) 5 MG tablet Take 1 tablet (5 mg total) by mouth as directed. Patient taking differently: Take 5 mg by mouth daily.  07/13/15   Roma Schanz R, DO  hydrochlorothiazide (HYDRODIURIL) 25 MG tablet TAKE 1 TABLET BY MOUTH DAILY Patient taking differently: TAKE 25 mg BY MOUTH DAILY 09/09/16   Ann Held, DO  levothyroxine (SYNTHROID, LEVOTHROID) 50 MCG tablet Take 1 tablet (50 mcg total) by mouth daily. 04/07/17   Ann Held, DO  loratadine (CLARITIN) 10 MG tablet Take 1 tablet (10 mg total) by mouth daily. Patient not taking: Reported on 02/20/2017 04/09/16   Roma Schanz R, DO  memantine [facility])  10 MG tablet Take 10 mg by mouth at bedtime.    [provider]  Misc. Devices (COMMODE BEDSIDE) MISC 1 device to use as needed 04/19/16   Carollee Herter, [place]. Devices (COMMODE BEDSIDE) MISC Use Device daily as needed. 05/01/16   Ann Held, DO  Misc. Devices (ROLLATOR ULTRA-LIGHT) MISC 1 Rollator walker to use and needed 04/19/16   Carollee Herter, Kendrick Fries R, DO  nystatin (MYCOSTATIN/NYSTOP) powder Apply 2 g topically every 12 (twelve) hours as  needed (groin rash).    [provider]  QUEtiapine (SEROQUEL) 25 MG tablet Take 25 mg by mouth daily. At 0800    [provider]  QUEtiapine (SEROQUEL) 50 MG tablet Take 50 mg by mouth at bedtime. At 2000    [provider]  traZODone (DESYREL) 100 MG tablet Take 100 mg by mouth at bedtime.    [provider]   Family History Family History  Problem Relation Age of Onset  . Stroke Mother   . Hypertension Mother   . Esophageal cancer Brother   . Heart disease Unknown   . Stroke Sister   . Dementia Brother   . Dementia Brother   . Dementia Sister    Social History Social History  Substance Use Topics  . Smoking status: Former Smoker    Packs/day: 1.00    Years: 20.00    Types: Cigarettes    Quit date: 08/24/1962  . Smokeless tobacco: Never Used  . Alcohol use No   Allergies   Patient has no known allergies.  Review of Systems Review of Systems  Constitutional: Positive for fatigue and fever. Negative for chills.  HENT: Negative for ear pain and sore throat.   Eyes: Negative for pain and visual disturbance.  Respiratory: Positive for cough and sputum production. Negative for shortness of breath.   Cardiovascular: Negative for chest pain and palpitations.  Gastrointestinal: Negative for abdominal pain and vomiting.  Genitourinary: Negative for dysuria and hematuria.  Musculoskeletal: Negative for arthralgias and back pain.  Skin: Negative for color change and rash.  Neurological: Positive for weakness. Negative for seizures and syncope.  All other systems reviewed and are negative.  Physical Exam Updated Vital Signs There were no vitals taken for this visit.  Physical Exam  Constitutional: He appears well-developed and well-nourished.  HENT:  Head: Normocephalic and atraumatic.  Eyes: Conjunctivae are normal.  Neck: Neck supple.  Cardiovascular: Normal rate and regular rhythm.   No murmur heard. Pulmonary/Chest: Effort normal.  No respiratory distress. He has wheezes. He has rales.  Abdominal: Soft. There is no tenderness.  Musculoskeletal: He exhibits no edema.  Neurological: He is alert.  Skin: Skin is warm and dry.  Psychiatric: He has a normal mood and affect.  Nursing note and vitals reviewed.  ED Treatments / Results  Labs (all labs ordered are listed, but only abnormal results are displayed) Labs Reviewed  CBG MONITORING, ED - Abnormal; Notable for the following:       Result Value   Glucose-Capillary 131 (*)    All other components within normal limits  I-STAT CHEM 8, ED - Abnormal; Notable for the following:    Chloride 97 (*)    BUN 34 (*)    Creatinine, Ser 1.40 (*)    Glucose, Bld 133 (*)    Calcium, Ion 0.96 (*)    All other components within normal limits  PROTIME-INR  APTT  CBC  DIFFERENTIAL  COMPREHENSIVE METABOLIC PANEL  I-STAT TROPONIN, ED   EKG  EKG Interpretation None      Radiology Dg Chest Portable 1 View  Result Date: 05/08/2017 CLINICAL DATA:  Initial evaluation for acute weakness. EXAM: PORTABLE CHEST 1 VIEW COMPARISON:  Prior radiograph from 02/21/2017. FINDINGS: Mild cardiomegaly, stable.  Mediastinal silhouette normal. Lungs hypoinflated. No focal infiltrates. Mild left basilar atelectasis. No pulmonary edema or pleural effusion. No pneumothorax. No acute osseus abnormality. IMPRESSION: 1. Shallow lung inflation with mild left basilar atelectasis. 2. No other active cardiopulmonary disease. Electronically Signed   By: Jeannine Boga M.D.   On: 05/08/2017 22:33   Ct Head Code Stroke Wo Contrast  Result Date: 05/08/2017 CLINICAL DATA:  Code stroke.  83 y/o  M; right-sided weakness. EXAM: CT HEAD WITHOUT CONTRAST TECHNIQUE: Contiguous axial images were obtained from the base of the skull through the vertex without intravenous contrast. COMPARISON:  02/20/2017 CT of the head. FINDINGS: Brain: No evidence of acute infarction, hemorrhage, hydrocephalus, extra-axial  collection or mass lesion/mass effect. Stable chronic microvascular ischemic changes and parenchymal volume loss of the brain. Stable small chronic lacunar infarcts of the right cerebellar hemisphere. Vascular: Calcific atherosclerosis of carotid siphons. No hyperdense vessel identified. Skull: Normal. Negative for fracture or focal lesion. Sinuses/Orbits: No acute finding. Other: None. ASPECTS [facility] Stroke Program Early CT Score) - Ganglionic level infarction (caudate, lentiform nuclei, internal capsule, insula, M1-M3 cortex): 7 - Supraganglionic infarction (M4-M6 cortex): 3 Total score (0-10 with 10 being normal): 10 IMPRESSION: 1. No acute intracranial abnormality identified. 2. ASPECTS is 10 3. Stable chronic microvascular ischemic changes and parenchymal volume loss of the brain. These results were called by telephone at the time of interpretation on 05/08/2017 at 9:31 pm to Dr. Pattricia Boss , who verbally acknowledged these results. Electronically Signed   By: Kristine Garbe M.D.   On: 05/08/2017 21:32   Procedures Procedures (including critical care time)  Medications Ordered in ED Medications - No data to display  Initial Impression / Assessment and Plan / ED Course  I have reviewed the triage vital signs and the nursing notes.  Pertinent labs & imaging results that were available during my care of the patient were reviewed by me and considered in my medical decision making (see chart for details).  Pt is a 83 y.o. male with pertinent PMHX dementia and hypertension who presented as a code stroke after he was found slumped over with difficulty breathing at his nursing facility.   The patient was quickly assessed by myself and the attending. The stroke team was immediately available. A through neurological exam was performed, and pertinent findings include: normal cranial nerves, equal grip strength, moves lower extremities equally bilaterally.   CT scanner was made available and  the patient was rapidly transported.  Head CT full report above, but in summary revealed no acute intracranial abnormalities   At this time consider the patient's symptoms to be inconsistent with stroke, code stroke canceled. Continued to work up patient for shortness or breath and generalized weakness.   Labs studies: CBC wnl, CMP with AKI creatine of 1.47 up from baseline of 1.0,  CXR: Shallow lung inflation with mild left basilar atelectasis.  However patient was recently diagnosed with pneumonia outpatient and was started on antibiotic 1 day ago. He has been febrile and with cough. At this time consider pneumonia still highly likely and will continue treatment .   Other possible cause that were considered include infectious causes (encephalopathy, meningitis), tumor/abscess, toxic causes (hypoglycemia, renal failure, hepatic  failure, toxic ingestion), other neurologic disorders (seizure, migraine, Todd paralysis, GBS, status epilepticus, BPPV, disc herniation, diabetic neuropathy), psychiatric causes (conversion) and trauma (ICH, spinal injury).   Labs and imaging reviewed by myself and considered in medical decision making if ordered.  Imaging interpreted by radiology.  Patient admitted to the hospitalist in stable condition for pneumonia and AKI   Pt was discussed with my attending, Dr. Jeanell Sparrow.  Final Clinical Impressions(s) / ED Diagnoses   Final diagnoses:  Generalized weakness  Community acquired pneumonia, unspecified laterality  AKI (acute kidney injury) ([place])   New Prescriptions New Prescriptions   No medications on file     Fenton Foy, MD 05/09/17 2231    Pattricia Boss, MD 05/10/17 1501

## 2017-05-08 NOTE — ED Notes (Signed)
ED Provider at bedside. 

## 2017-05-08 NOTE — H&P (Signed)
History and Physical    Louis Patel XNT:[id number] DOB: 1925-08-21 DOA: 05/08/2017  PCP: Orvis Brill, [facility]  Patient coming from: ALF  I have personally briefly reviewed patient's old medical records in [place]  Chief Complaint: Weakness  HPI: Louis Patel is a 83 y.o. male with medical history significant of Dementia, HLD, HTN.  Patient was just diagnosed with PNA at his memory care AL yesterday.  Today [id number], he was found slumped in his chair with slurred speech, reported R sided weakness.  Patient sent in to ED as a code stroke.  Is on pureed diet, nectar thick liquids at baseline.   ED Course: CT head negative, Tm 100.5.  Neurology saw patient, note pending but per EDP neurology was less impressed for acute stroke.  CXR shows "actelectasis" at L base.  Creat 1.4 up from 1.0 baseline.  UA pending.  EDP requests admit for presumptive diagnosis of CAP.   Review of Systems: unable to perform due to dementia.  Past Medical History:  Diagnosis Date  . Colon polyps   . Dementia   . Diverticulosis   . Enlarged prostate   . Hyperlipidemia   . Hypertension   . Hypothyroidism     Past Surgical History:  Procedure Laterality Date  . CATARACT EXTRACTION Left 07/18/08   left cataract extraction  . CATARACT EXTRACTION Right   . COLONOSCOPY    . PROSTATE SURGERY  3/08   Prostatectomy  . TONSILLECTOMY       reports that he quit smoking about 54 years ago. His smoking use included Cigarettes. He has a 20.00 pack-year smoking history. He has never used smokeless tobacco. He reports that he does not drink alcohol or use drugs.  No Known Allergies  Family History  Problem Relation Age of Onset  . Stroke Mother   . Hypertension Mother   . Esophageal cancer Brother   . Heart disease Unknown   . Stroke Sister   . Dementia Brother   . Dementia Brother   . Dementia Sister      Prior to Admission medications   Medication Sig Start Date End Date  Taking? Authorizing Provider  azelastine (OPTIVAR) 0.05 % ophthalmic solution Place 1 drop into both eyes 2 (two) times daily.    [provider]  docusate sodium (COLACE) 100 MG capsule Take 100 mg by mouth daily.    [provider]  donepezil (ARICEPT) 10 MG tablet Take 2 tablets (20 mg total) by mouth daily. 02/01/16   Roma Schanz R, DO  escitalopram (LEXAPRO) 10 MG tablet Take 1 tablet (10 mg total) by mouth daily. 02/21/16   Cameron Sprang, MD  fexofenadine (ALLEGRA) 180 MG tablet Take 180 mg by mouth daily.    [provider]  finasteride (PROSCAR) 5 MG tablet Take 1 tablet (5 mg total) by mouth as directed. Patient taking differently: Take 5 mg by mouth daily.  07/13/15   Roma Schanz R, DO  hydrochlorothiazide (HYDRODIURIL) 25 MG tablet TAKE 1 TABLET BY MOUTH DAILY Patient taking differently: TAKE 25 mg BY MOUTH DAILY 09/09/16   Ann Held, DO  levothyroxine (SYNTHROID, LEVOTHROID) 50 MCG tablet Take 1 tablet (50 mcg total) by mouth daily. 04/07/17   Ann Held, DO  loratadine (CLARITIN) 10 MG tablet Take 1 tablet (10 mg total) by mouth daily. Patient not taking: Reported on 02/20/2017 04/09/16   Carollee Herter, Kendrick Fries R, DO  [facility]antine [facility]) 10  MG tablet Take 10 mg by mouth at bedtime.    [provider]  Misc. Devices (COMMODE BEDSIDE) MISC 1 device to use as needed 04/19/16   Carollee Herter, [place]. Devices (COMMODE BEDSIDE) MISC Use Device daily as needed. 05/01/16   Ann Held, DO  Misc. Devices (ROLLATOR ULTRA-LIGHT) MISC 1 Rollator walker to use and needed 04/19/16   Carollee Herter, Kendrick Fries R, DO  nystatin (MYCOSTATIN/NYSTOP) powder Apply 2 g topically every 12 (twelve) hours as needed (groin rash).    [provider]  QUEtiapine (SEROQUEL) 25 MG tablet Take 25 mg by mouth daily. At 0800    [provider]  QUEtiapine (SEROQUEL) 50 MG tablet Take 50 mg by mouth at bedtime. At 2000     [provider]  traZODone (DESYREL) 100 MG tablet Take 100 mg by mouth at bedtime.    [provider]    Physical Exam: Vitals:   05/08/17 2145 05/08/17 2203 05/08/17 2215 05/08/17 2300  BP: 106/77  112/64 121/76  Pulse: (!) 103  (!) 101 96  Resp: (!) 21  17 (!) 21  Temp: 98.7 F (37.1 C) (!) 100.5 F (38.1 C)    TempSrc: Oral (S) Rectal    SpO2: 100%  100% 99%  Height:        Constitutional: NAD, calm, comfortable Eyes: PERRL, lids and conjunctivae normal ENMT: Mucous membranes are moist. Posterior pharynx clear of any exudate or lesions.Normal dentition.  Neck: normal, supple, no masses, no thyromegaly Respiratory: clear to auscultation bilaterally on my exam Cardiovascular: Regular rate and rhythm, no murmurs / rubs / gallops. No extremity edema. 2+ pedal pulses. No carotid bruits.  Abdomen: no tenderness, no masses palpated. No hepatosplenomegaly. Bowel sounds positive.  Musculoskeletal: no clubbing / cyanosis. No joint deformity upper and lower extremities. Good ROM, no contractures. Normal muscle tone.  Skin: no rashes, lesions, ulcers. No induration Neurologic: CN 2-12 grossly intact. Sensation intact, DTR normal. Strength 5/5 in all 4.  Psychiatric: Oriented X2   Labs on Admission: I have personally reviewed following labs and imaging studies  CBC:  Recent Labs Lab 05/08/17 2106 05/08/17 2115  WBC 8.2  --   NEUTROABS 6.4  --   HGB 15.3 16.0  HCT 45.4 47.0  MCV 88.0  --   PLT 182  --    Basic Metabolic Panel:  Recent Labs Lab 05/08/17 2106 05/08/17 2115  NA 136 136  K 3.9 4.5  CL 96* 97*  CO2 26  --   GLUCOSE 132* 133*  BUN 24* 34*  CREATININE 1.47* 1.40*  CALCIUM 8.6*  --    GFR: CrCl cannot be calculated (Unknown ideal weight.). Liver Function Tests:  Recent Labs Lab 05/08/17 2106  AST 35  ALT 25  ALKPHOS 60  BILITOT 1.4*  PROT 6.6  ALBUMIN 3.3*   No results for input(s): LIPASE, AMYLASE in the last 168 hours. No  results for input(s): AMMONIA in the last 168 hours. Coagulation Profile:  Recent Labs Lab 05/08/17 2106  INR 1.05   Cardiac Enzymes: No results for input(s): CKTOTAL, CKMB, CKMBINDEX, TROPONINI in the last 168 hours. BNP (last 3 results) No results for input(s): PROBNP in the last 8760 hours. HbA1C: No results for input(s): HGBA1C in the last 72 hours. CBG:  Recent Labs Lab 05/08/17 2105  GLUCAP 131*   Lipid Profile: No results for input(s): CHOL, HDL, LDLCALC, TRIG, CHOLHDL, LDLDIRECT in the last 72 hours. Thyroid Function Tests:  No results for input(s): TSH, T4TOTAL, FREET4, T3FREE, THYROIDAB in the last 72 hours. Anemia Panel: No results for input(s): VITAMINB12, FOLATE, FERRITIN, TIBC, IRON, RETICCTPCT in the last 72 hours. Urine analysis:    Component Value Date/Time   COLORURINE YELLOW 02/21/2017 1038   APPEARANCEUR CLEAR 02/21/2017 1038   LABSPEC 1.010 02/21/2017 1038   PHURINE 6.0 02/21/2017 1038   GLUCOSEU NEGATIVE 02/21/2017 1038   HGBUR MODERATE (A) 02/21/2017 1038   HGBUR negative 06/27/2009 0809   BILIRUBINUR NEGATIVE 02/21/2017 1038   BILIRUBINUR neg 07/14/2015 1124   KETONESUR NEGATIVE 02/21/2017 1038   PROTEINUR NEGATIVE 02/21/2017 1038   UROBILINOGEN 0.2 07/14/2015 1124   UROBILINOGEN 0.2 10/10/2014 2256   NITRITE NEGATIVE 02/21/2017 1038   LEUKOCYTESUR NEGATIVE 02/21/2017 1038    Radiological Exams on Admission: Dg Chest Portable 1 View  Result Date: 05/08/2017 CLINICAL DATA:  Initial evaluation for acute weakness. EXAM: PORTABLE CHEST 1 VIEW COMPARISON:  Prior radiograph from 02/21/2017. FINDINGS: Mild cardiomegaly, stable.  Mediastinal silhouette normal. Lungs hypoinflated. No focal infiltrates. Mild left basilar atelectasis. No pulmonary edema or pleural effusion. No pneumothorax. No acute osseus abnormality. IMPRESSION: 1. Shallow lung inflation with mild left basilar atelectasis. 2. No other active cardiopulmonary disease. Electronically  Signed   By: Jeannine Boga M.D.   On: 05/08/2017 22:33   Ct Head Code Stroke Wo Contrast  Result Date: 05/08/2017 CLINICAL DATA:  Code stroke.  83 y/o  M; right-sided weakness. EXAM: CT HEAD WITHOUT CONTRAST TECHNIQUE: Contiguous axial images were obtained from the base of the skull through the vertex without intravenous contrast. COMPARISON:  02/20/2017 CT of the head. FINDINGS: Brain: No evidence of acute infarction, hemorrhage, hydrocephalus, extra-axial collection or mass lesion/mass effect. Stable chronic microvascular ischemic changes and parenchymal volume loss of the brain. Stable small chronic lacunar infarcts of the right cerebellar hemisphere. Vascular: Calcific atherosclerosis of carotid siphons. No hyperdense vessel identified. Skull: Normal. Negative for fracture or focal lesion. Sinuses/Orbits: No acute finding. Other: None. ASPECTS [facility] Stroke Program Early CT Score) - Ganglionic level infarction (caudate, lentiform nuclei, internal capsule, insula, M1-M3 cortex): 7 - Supraganglionic infarction (M4-M6 cortex): 3 Total score (0-10 with 10 being normal): 10 IMPRESSION: 1. No acute intracranial abnormality identified. 2. ASPECTS is 10 3. Stable chronic microvascular ischemic changes and parenchymal volume loss of the brain. These results were called by telephone at the time of interpretation on 05/08/2017 at 9:31 pm to Dr. Pattricia Boss , who verbally acknowledged these results. Electronically Signed   By: Kristine Garbe M.D.   On: 05/08/2017 21:32    EKG: Independently reviewed.  Assessment/Plan Principal Problem:   CAP (community acquired pneumonia) Active Problems:   Essential hypertension   Dementia with behavioral disturbance    1. CAP - 1. PNA pathway 2. Check influenza pnl 3. Empiric rocephin / azithromycin 4. IVF: NS at 125 5. Repeat BMP in AM 2. Dementia - 1. Continue home meds 2. Looks like they have him on pureed diet with nectar thick liquids based  on med rec from SNF 3. HTN - 1. Continue home meds 2. But holding diuretics  DVT prophylaxis: Lovenox Code Status: DNR - this confirmed with family, at bedside tonight, please ignore the ALF paperwork that says full code as this is incorrect Family Communication: Family at bedside Disposition Plan: ALF after admit Consults called: None Admission status: Admit to inpatient   [place], [place] [facility] Pager [phone]  If 7AM-7PM, please contact day team taking care of patient [email or web address]  Password [email or web address]  05/08/2017, 11:34 PM

## 2017-05-08 NOTE — ED Triage Notes (Signed)
Per EMS, pt from [facility]. in [place]. Facility states LSN was at 1730 at dinner slumped in his chair with slurred speech, rt sided weakness and rt sided drift. Pt reports nausea and decreased rt grip strength as well. Pt has hx of dementia with aggression. A&Ox2. Pt was diagnosed with pneumonia yesterday.

## 2017-05-08 NOTE — ED Notes (Signed)
Patient transported to CT 

## 2017-05-09 ENCOUNTER — Inpatient Hospital Stay (HOSPITAL_COMMUNITY): Payer: Medicare Other

## 2017-05-09 DIAGNOSIS — N179 Acute kidney failure, unspecified: Secondary | ICD-10-CM

## 2017-05-09 DIAGNOSIS — E871 Hypo-osmolality and hyponatremia: Secondary | ICD-10-CM

## 2017-05-09 LAB — URINALYSIS, ROUTINE W REFLEX MICROSCOPIC
BILIRUBIN URINE: NEGATIVE
Glucose, UA: NEGATIVE mg/dL
HGB URINE DIPSTICK: NEGATIVE
Ketones, ur: 5 mg/dL — AB
Leukocytes, UA: NEGATIVE
Nitrite: NEGATIVE
PROTEIN: NEGATIVE mg/dL
Specific Gravity, Urine: 1.02 (ref 1.005–1.030)
pH: 6 (ref 5.0–8.0)

## 2017-05-09 LAB — BASIC METABOLIC PANEL
Anion gap: 10 (ref 5–15)
BUN: 21 mg/dL — AB (ref 6–20)
CALCIUM: 8.2 mg/dL — AB (ref 8.9–10.3)
CO2: 30 mmol/L (ref 22–32)
Chloride: 94 mmol/L — ABNORMAL LOW (ref 101–111)
Creatinine, Ser: 1.22 mg/dL (ref 0.61–1.24)
GFR calc Af Amer: 59 mL/min — ABNORMAL LOW (ref >60)
GFR, EST NON AFRICAN AMERICAN: 51 mL/min — AB (ref >60)
GLUCOSE: 124 mg/dL — AB (ref 65–99)
Potassium: 3.1 mmol/L — ABNORMAL LOW (ref 3.5–5.1)
SODIUM: 134 mmol/L — AB (ref 135–145)

## 2017-05-09 LAB — RESPIRATORY PANEL BY PCR
Adenovirus: NOT DETECTED
Bordetella pertussis: NOT DETECTED
CHLAMYDOPHILA PNEUMONIAE-RVPPCR: NOT DETECTED
CORONAVIRUS NL63-RVPPCR: NOT DETECTED
Coronavirus 229E: NOT DETECTED
Coronavirus HKU1: NOT DETECTED
Coronavirus OC43: NOT DETECTED
INFLUENZA A-RVPPCR: NOT DETECTED
Influenza B: NOT DETECTED
Metapneumovirus: NOT DETECTED
Mycoplasma pneumoniae: NOT DETECTED
PARAINFLUENZA VIRUS 3-RVPPCR: NOT DETECTED
PARAINFLUENZA VIRUS 4-RVPPCR: NOT DETECTED
Parainfluenza Virus 1: NOT DETECTED
Parainfluenza Virus 2: DETECTED — AB
RHINOVIRUS / ENTEROVIRUS - RVPPCR: NOT DETECTED
Respiratory Syncytial Virus: NOT DETECTED

## 2017-05-09 LAB — AMMONIA: Ammonia: 14 umol/L (ref 9–35)

## 2017-05-09 LAB — CBC
HCT: 41.2 % (ref 39.0–52.0)
Hemoglobin: 13.3 g/dL (ref 13.0–17.0)
MCH: 28.4 pg (ref 26.0–34.0)
MCHC: 32.3 g/dL (ref 30.0–36.0)
MCV: 88 fL (ref 78.0–100.0)
PLATELETS: 183 10*3/uL (ref 150–400)
RBC: 4.68 MIL/uL (ref 4.22–5.81)
RDW: 16.5 % — AB (ref 11.5–15.5)
WBC: 7.2 10*3/uL (ref 4.0–10.5)

## 2017-05-09 LAB — MAGNESIUM: MAGNESIUM: 1.9 mg/dL (ref 1.7–2.4)

## 2017-05-09 LAB — PROCALCITONIN

## 2017-05-09 LAB — HIV ANTIBODY (ROUTINE TESTING W REFLEX): HIV SCREEN 4TH GENERATION: NONREACTIVE

## 2017-05-09 LAB — INFLUENZA PANEL BY PCR (TYPE A & B)
Influenza A By PCR: NEGATIVE
Influenza B By PCR: NEGATIVE

## 2017-05-09 LAB — TSH: TSH: 2.248 u[IU]/mL (ref 0.350–4.500)

## 2017-05-09 LAB — MRSA PCR SCREENING: MRSA by PCR: NEGATIVE

## 2017-05-09 LAB — STREP PNEUMONIAE URINARY ANTIGEN: Strep Pneumo Urinary Antigen: NEGATIVE

## 2017-05-09 MED ORDER — LORATADINE 10 MG PO TABS
10.0000 mg | ORAL_TABLET | Freq: Every day | ORAL | Status: DC
  Administered 2017-05-09 – 2017-05-12 (×4): 10 mg via ORAL
  Filled 2017-05-09 (×4): qty 1

## 2017-05-09 MED ORDER — DONEPEZIL HCL 10 MG PO TABS
10.0000 mg | ORAL_TABLET | Freq: Every day | ORAL | Status: DC
  Administered 2017-05-09 – 2017-05-11 (×3): 10 mg via ORAL
  Filled 2017-05-09 (×3): qty 1

## 2017-05-09 MED ORDER — DIVALPROEX SODIUM ER 500 MG PO TB24: 500.0000 mg | ORAL_TABLET | Freq: Every day | ORAL | Status: DC

## 2017-05-09 MED ORDER — CHLORHEXIDINE GLUCONATE CLOTH 2 % EX PADS
6.0000 | MEDICATED_PAD | Freq: Every day | CUTANEOUS | Status: DC
  Administered 2017-05-09 – 2017-05-12 (×4): 6 via TOPICAL

## 2017-05-09 MED ORDER — DIVALPROEX SODIUM ER 250 MG PO TB24
750.0000 mg | ORAL_TABLET | Freq: Every day | ORAL | Status: DC
  Administered 2017-05-09 – 2017-05-12 (×4): 750 mg via ORAL
  Filled 2017-05-09 (×4): qty 3

## 2017-05-09 MED ORDER — RISPERIDONE 1 MG PO TABS
1.0000 mg | ORAL_TABLET | Freq: Four times a day (QID) | ORAL | Status: DC | PRN
  Filled 2017-05-09: qty 1

## 2017-05-09 MED ORDER — LITHIUM CARBONATE 300 MG PO CAPS
300.0000 mg | ORAL_CAPSULE | Freq: Every day | ORAL | Status: DC
  Administered 2017-05-09 – 2017-05-11 (×3): 300 mg via ORAL
  Filled 2017-05-09 (×4): qty 1

## 2017-05-09 MED ORDER — RISPERIDONE 1 MG PO TABS
1.0000 mg | ORAL_TABLET | Freq: Every day | ORAL | Status: DC
  Administered 2017-05-10 – 2017-05-11 (×2): 1 mg via ORAL
  Filled 2017-05-09 (×4): qty 1

## 2017-05-09 MED ORDER — LEVOTHYROXINE SODIUM 50 MCG PO TABS
50.0000 ug | ORAL_TABLET | Freq: Every day | ORAL | Status: DC
  Administered 2017-05-09 – 2017-05-12 (×4): 50 ug via ORAL
  Filled 2017-05-09 (×4): qty 1

## 2017-05-09 MED ORDER — KETOTIFEN FUMARATE 0.025 % OP SOLN
1.0000 [drp] | Freq: Two times a day (BID) | OPHTHALMIC | Status: DC
  Administered 2017-05-09 – 2017-05-12 (×7): 1 [drp] via OPHTHALMIC
  Filled 2017-05-09: qty 5

## 2017-05-09 MED ORDER — POTASSIUM CHLORIDE CRYS ER 20 MEQ PO TBCR
40.0000 meq | EXTENDED_RELEASE_TABLET | ORAL | Status: AC
  Administered 2017-05-09 (×2): 40 meq via ORAL
  Filled 2017-05-09 (×2): qty 2

## 2017-05-09 MED ORDER — MEMANTINE HCL 10 MG PO TABS
10.0000 mg | ORAL_TABLET | Freq: Every day | ORAL | Status: DC
Start: 2017-05-09 — End: 2017-05-12
  Administered 2017-05-09 – 2017-05-11 (×3): 10 mg via ORAL
  Filled 2017-05-09 (×3): qty 1

## 2017-05-09 MED ORDER — RISPERIDONE 0.5 MG PO TABS
0.5000 mg | ORAL_TABLET | Freq: Every day | ORAL | Status: DC
  Administered 2017-05-09 – 2017-05-12 (×4): 0.5 mg via ORAL
  Filled 2017-05-09 (×4): qty 1

## 2017-05-09 MED ORDER — FINASTERIDE 5 MG PO TABS
5.0000 mg | ORAL_TABLET | Freq: Every day | ORAL | Status: DC
  Administered 2017-05-09 – 2017-05-12 (×4): 5 mg via ORAL
  Filled 2017-05-09 (×4): qty 1

## 2017-05-09 MED ORDER — NYSTATIN 100000 UNIT/GM EX POWD
2.0000 g | Freq: Every evening | CUTANEOUS | Status: DC
  Administered 2017-05-09 – 2017-05-11 (×3): 2 g via TOPICAL
  Filled 2017-05-09: qty 15

## 2017-05-09 MED ORDER — MUPIROCIN 2 % EX OINT
1.0000 "application " | TOPICAL_OINTMENT | Freq: Two times a day (BID) | CUTANEOUS | Status: DC
  Administered 2017-05-09 – 2017-05-12 (×7): 1 via NASAL
  Filled 2017-05-09 (×3): qty 22

## 2017-05-09 MED ORDER — LORAZEPAM 0.5 MG PO TABS: 0.5000 mg | ORAL_TABLET | Freq: Three times a day (TID) | ORAL | Status: DC | PRN

## 2017-05-09 MED ORDER — TRAZODONE HCL 100 MG PO TABS
100.0000 mg | ORAL_TABLET | Freq: Every day | ORAL | Status: DC
  Administered 2017-05-10 – 2017-05-11 (×2): 100 mg via ORAL
  Filled 2017-05-09 (×3): qty 1

## 2017-05-09 MED ORDER — DOCUSATE SODIUM 100 MG PO CAPS
100.0000 mg | ORAL_CAPSULE | Freq: Every day | ORAL | Status: DC
  Administered 2017-05-09 – 2017-05-11 (×3): 100 mg via ORAL
  Filled 2017-05-09 (×4): qty 1

## 2017-05-09 MED ORDER — GUAIFENESIN ER 600 MG PO TB12
1200.0000 mg | ORAL_TABLET | Freq: Two times a day (BID) | ORAL | Status: DC
  Administered 2017-05-09 – 2017-05-11 (×5): 1200 mg via ORAL
  Filled 2017-05-09 (×7): qty 2

## 2017-05-09 NOTE — ED Notes (Signed)
Admitting MD at bedside.

## 2017-05-09 NOTE — Evaluation (Signed)
Clinical/Bedside Swallow Evaluation Patient Details  Name: Mung Rinker MRN: [id number] Date of Birth: [birth year]  Today's Date: 05/09/2017 Time: SLP Start Time (ACUTE ONLY): 96 SLP Stop Time (ACUTE ONLY): 0932 SLP Time Calculation (min) (ACUTE ONLY): 12 min  Past Medical History:  Past Medical History:  Diagnosis Date  . Colon polyps   . Dementia   . Diverticulosis   . Enlarged prostate   . Hyperlipidemia   . Hypertension   . Hypothyroidism    Past Surgical History:  Past Surgical History:  Procedure Laterality Date  . CATARACT EXTRACTION Left 07/18/08   left cataract extraction  . CATARACT EXTRACTION Right   . COLONOSCOPY    . PROSTATE SURGERY  3/08   Prostatectomy  . TONSILLECTOMY     HPI:  JAHKARI MACLIN Sris a 83 y.o.malewith medical history significant of Dementia, HLD, HTN. Patient was just diagnosed with PNA at his memory care AL yesterday. Today LKW 6734, he was found slumped in his chair with slurred speech, reported R sided weakness.  Is on pureed diet, nectar thick liquids at baseline. Initial CXR shows Shallow lung inflation with mild left basilar atelectasis. Head CT negative. No SLP notes found. Discussed pt with his duaghter who reports pt became combative in August and meds have needed adjustment, making him lethargic and thus causing difficulty eating and drinking. She denies any knowledge of objective testing.    Assessment / Plan / Recommendation Clinical Impression  Pt alert for assessment, eager to eat and drink, enjoying sips of water with no overt signs of aspiration or dysphagia. Discussed pt with his daughter and reproted findings but offered option of objective testing to better determine tolerance of thin liquids given concern for pneumonia. Daughter would like to focus of pts pleasure with PO and verbalizes that regardless of outcome of MBS she would favor thin liquids and soft solids. Given pts good tolerance at bedside, adequate arousal  and plan of care, will initiate a dys 3 mech soft diet with supervision and f/u for tolerance. Daughter in agreement with plan.  SLP Visit Diagnosis: Dysphagia, unspecified (R13.10)    Aspiration Risk  Mild aspiration risk    Diet Recommendation Dysphagia 3 (Mech soft);Thin liquid   Liquid Administration via: Cup Medication Administration: Whole meds with puree Supervision: Full supervision/cueing for compensatory strategies Compensations: Slow rate;Small sips/bites;Minimize environmental distractions Postural Changes: Seated upright at 90 degrees;Remain upright for at least 30 minutes after po intake    Other  Recommendations Oral Care Recommendations: Oral care BID   Follow up Recommendations Skilled Nursing facility      Frequency and Duration min 2x/week  2 weeks       Prognosis Prognosis for Safe Diet Advancement: Good Barriers to Reach Goals: Cognitive deficits      Swallow Study   General HPI: JOHNDANIEL CATLIN Sris a 83 y.o.malewith medical history significant of Dementia, HLD, HTN. Patient was just diagnosed with PNA at his memory care AL yesterday. Today LKW 1937, he was found slumped in his chair with slurred speech, reported R sided weakness.  Is on pureed diet, nectar thick liquids at baseline. Initial CXR shows Shallow lung inflation with mild left basilar atelectasis. Head CT negative. No SLP notes found. Discussed pt with his duaghter who reports pt becamse combative in august and meds have needed adjustment, making him lethargic and thus causing difficulty eating and drinking. She denies any knowledge of objective testing.  Type of Study: Bedside Swallow Evaluation Previous Swallow Assessment:  see impression Diet Prior to this Study: NPO Temperature Spikes Noted: No Respiratory Status: Nasal cannula History of Recent Intubation: No Behavior/Cognition: Alert;Cooperative;Pleasant mood Oral Cavity Assessment: Dry Oral Care Completed by SLP: Yes Oral Cavity -  Dentition: Poor condition;Missing dentition Vision: Functional for self-feeding Self-Feeding Abilities: Needs assist Patient Positioning: Upright in bed Baseline Vocal Quality: Normal Volitional Cough: Cognitively unable to elicit Volitional Swallow: Unable to elicit    Oral/Motor/Sensory Function Overall Oral Motor/Sensory Function: Within functional limits   Ice Chips Ice chips: Not tested   Thin Liquid Thin Liquid: Within functional limits Presentation: Cup    Nectar Thick Nectar Thick Liquid: Not tested   Honey Thick Honey Thick Liquid: Not tested   Puree Puree: Within functional limits   Solid   GO   Solid: Within functional limits       [facility] [address], [place] CCC-SLP [phone]  Rosie Golson, Katherene Ponto 05/09/2017,10:11 AM

## 2017-05-09 NOTE — Consult Note (Addendum)
Start Nurse wound consult note Reason for Consult: Consult requested for left leg.  Appearance is consistent with a previous blister which has ruptured and evolved into full thickness skin loss. Wound type: Left upper calf with partial thickness wound; 1X1X.1cm, pink and dry, and middle calf 10X12X.2cm, red and moist Drainage (amount, consistency, odor) small amt tan drainage, no odor Periwound: intact skin surrounding Dressing procedure/placement/frequency: xeroform gauze to promote drying and healing, ace wrap for light compression.  No family members present at the bedside to discuss plan of care. Please re-consult if further assistance is needed.  Thank-you,  Julien Girt MSN, [place], [place], [place], [place]

## 2017-05-09 NOTE — Progress Notes (Signed)
Pt arrived to 5W from the ED around 0045 am via stretcher. Pt was slid from the stretcher to the bed. Pt A&Ox2 (oriented to person/place-he knows he is in the hospital but couldn't name which hospital). White arm band in place with correct pt identifiers. Skin assessment completed with Leonides Sake, RN: see assessment for details. Pt swabbed for the flu-resulted negative. Also sent off MRSA swab-result is currently pending. Pt oriented to unit/room/rules/call light. Pt appears comfortable at this time, no distress noted. Call light within reach, will ctm.

## 2017-05-09 NOTE — Progress Notes (Signed)
PROGRESS NOTE    Louis Patel   TDV:[id number]  DOB: 1925-08-10  DOA: 05/08/2017 PCP: Orvis Brill, [facility]   Brief Narrative:  Louis Patel is a 83 y.o. male with medical history significant of Dementia, HLD, HTN.  Patient was just diagnosed with PNA at his memory care AL yesterday. On the day of admission, he was noted to be normal around 5: 30 but around 6 PM he was found slumped in his chair with slurred speech, reported R sided weakness and was sent in to ED as a code stroke. Found to be hypoxic as well and mentation improved but the time he came to the ER Temp 100.5, Cr up from 1.0 to 1.4.  CXR did not show any infiltrates.    Subjective: No complaints today. Quite sleepy.  ROS: no complaints of nausea, vomiting, constipation diarrhea, dyspnea or dysuria. No other complaints.   Assessment & Plan:   Principal Problem:   CAP (community acquired pneumonia) - Rocephin, Zithromax - parainfluenza + on Resp virus panel-  - check Procalcitonin and if negative, d/c antibiotics - SLP eval- on pureed diet, nectar thick liquids at baseline.  Active Problems:   Dementia with behavioral disturbance - cont Depakote, Namenda, Lithium, Aricept, Risperdal, Trazodone    AKI (acute kidney injury),  Hyponatremia   - sodium 134 - hold Aldactone- cont IVF- no prior ECHO to see EF - Cr better today  Hypokalemia - replacing    DVT prophylaxis: Lovenox Code Status: DNR Family Communication:  Disposition Plan: ALF vs SNF  Consultants:   none Procedures:    Antimicrobials:  Anti-infectives    Start     Dose/Rate Route Frequency Ordered Stop   05/09/17 2200  cefTRIAXone (ROCEPHIN) 1 g in dextrose 5 % 50 mL IVPB     1 g 100 mL/hr over 30 Minutes Intravenous Every 24 hours 05/08/17 2306 05/15/17 2159   05/09/17 0000  azithromycin (ZITHROMAX) 500 mg in dextrose 5 % 250 mL IVPB     500 mg 250 mL/hr over 60 Minutes Intravenous Every 24 hours 05/08/17 2306 05/15/17 2359    05/08/17 2315  cefTRIAXone (ROCEPHIN) 1 g in dextrose 5 % 50 mL IVPB     1 g 100 mL/hr over 30 Minutes Intravenous  Once 05/08/17 2314 05/09/17 0221   05/08/17 2245  levofloxacin (LEVAQUIN) IVPB 750 mg  Status:  Discontinued     750 mg 100 mL/hr over 90 Minutes Intravenous  Once 05/08/17 2239 05/08/17 2306       Objective: Vitals:   05/09/17 0015 05/09/17 0116 05/09/17 0502 05/09/17 1359  BP: 127/73 125/75 (!) 145/63 (!) 93/47  Pulse: 99 (!) 104 86 61  Resp: 18 18  18   Temp:  98.2 F (36.8 C) 98.8 F (37.1 C) 98 F (36.7 C)  TempSrc:  Oral Oral Oral  SpO2: 100% 100% 100% 98%  Height:        Intake/Output Summary (Last 24 hours) at 05/09/17 1552 Last data filed at 05/09/17 0726  Gross per 24 hour  Intake              974 ml  Output                1 ml  Net              973 ml   There were no vitals filed for this visit.  Examination: General exam: Appears comfortable  HEENT: PERRLA, oral mucosa moist, no sclera  icterus or thrush Respiratory system: Clear to auscultation. Congested on exam. Respiratory effort normal. Pulse ox 100% on room air. Cardiovascular system: S1 & S2 heard, RRR.  No murmurs  Gastrointestinal system: Abdomen soft, non-tender, nondistended. Normal bowel sound. No organomegaly Central nervous system: sleepy- No focal neurological deficits. Extremities: No cyanosis, clubbing or edema Skin: No rashes or ulcers Psychiatry:  Mood & affect appropriate.     Data Reviewed: I have personally reviewed following labs and imaging studies  CBC:  Recent Labs Lab 05/08/17 2106 05/08/17 2115 05/09/17 0238  WBC 8.2  --  7.2  NEUTROABS 6.4  --   --   HGB 15.3 16.0 13.3  HCT 45.4 47.0 41.2  MCV 88.0  --  88.0  PLT 182  --  664   Basic Metabolic Panel:  Recent Labs Lab 05/08/17 2106 05/08/17 2115 05/09/17 0238 05/09/17 1045  NA 136 136 134*  --   K 3.9 4.5 3.1*  --   CL 96* 97* 94*  --   CO2 26  --  30  --   GLUCOSE 132* 133* 124*  --     BUN 24* 34* 21*  --   CREATININE 1.47* 1.40* 1.22  --   CALCIUM 8.6*  --  8.2*  --   MG  --   --   --  1.9   GFR: CrCl cannot be calculated (Unknown ideal weight.). Liver Function Tests:  Recent Labs Lab 05/08/17 2106  AST 35  ALT 25  ALKPHOS 60  BILITOT 1.4*  PROT 6.6  ALBUMIN 3.3*   No results for input(s): LIPASE, AMYLASE in the last 168 hours.  Recent Labs Lab 05/09/17 0238  AMMONIA 14   Coagulation Profile:  Recent Labs Lab 05/08/17 2106  INR 1.05   Cardiac Enzymes: No results for input(s): CKTOTAL, CKMB, CKMBINDEX, TROPONINI in the last 168 hours. BNP (last 3 results) No results for input(s): PROBNP in the last 8760 hours. HbA1C: No results for input(s): HGBA1C in the last 72 hours. CBG:  Recent Labs Lab 05/08/17 2105  GLUCAP 131*   Lipid Profile: No results for input(s): CHOL, HDL, LDLCALC, TRIG, CHOLHDL, LDLDIRECT in the last 72 hours. Thyroid Function Tests:  Recent Labs  05/09/17 0238  TSH 2.248   Anemia Panel: No results for input(s): VITAMINB12, FOLATE, FERRITIN, TIBC, IRON, RETICCTPCT in the last 72 hours. Urine analysis:    Component Value Date/Time   COLORURINE YELLOW 05/09/2017 1153   APPEARANCEUR CLEAR 05/09/2017 1153   LABSPEC 1.020 05/09/2017 1153   PHURINE 6.0 05/09/2017 1153   GLUCOSEU NEGATIVE 05/09/2017 1153   HGBUR NEGATIVE 05/09/2017 1153   HGBUR negative 06/27/2009 0809   BILIRUBINUR NEGATIVE 05/09/2017 1153   BILIRUBINUR neg 07/14/2015 1124   KETONESUR 5 (A) 05/09/2017 1153   PROTEINUR NEGATIVE 05/09/2017 1153   UROBILINOGEN 0.2 07/14/2015 1124   UROBILINOGEN 0.2 10/10/2014 2256   NITRITE NEGATIVE 05/09/2017 1153   LEUKOCYTESUR NEGATIVE 05/09/2017 1153   Sepsis Labs: @LABRCNTIP (procalcitonin:4,lacticidven:4) ) Recent Results (from the past 240 hour(s))  MRSA PCR Screening     Status: None   Collection Time: 05/09/17  1:16 AM  Result Value Ref Range Status   MRSA by PCR NEGATIVE NEGATIVE Final    Comment:         The GeneXpert MRSA Assay (FDA approved for NASAL specimens only), is one component of a comprehensive MRSA colonization surveillance program. It is not intended to diagnose MRSA infection nor to guide or monitor treatment for MRSA infections.  Respiratory Panel by PCR     Status: Abnormal   Collection Time: 05/09/17  8:50 AM  Result Value Ref Range Status   Adenovirus NOT DETECTED NOT DETECTED Final   Coronavirus 229E NOT DETECTED NOT DETECTED Final   Coronavirus HKU1 NOT DETECTED NOT DETECTED Final   Coronavirus NL63 NOT DETECTED NOT DETECTED Final   Coronavirus OC43 NOT DETECTED NOT DETECTED Final   Metapneumovirus NOT DETECTED NOT DETECTED Final   Rhinovirus / Enterovirus NOT DETECTED NOT DETECTED Final   Influenza A NOT DETECTED NOT DETECTED Final   Influenza B NOT DETECTED NOT DETECTED Final   Parainfluenza Virus 1 NOT DETECTED NOT DETECTED Final   Parainfluenza Virus 2 DETECTED (A) NOT DETECTED Final   Parainfluenza Virus 3 NOT DETECTED NOT DETECTED Final   Parainfluenza Virus 4 NOT DETECTED NOT DETECTED Final   Respiratory Syncytial Virus NOT DETECTED NOT DETECTED Final   Bordetella pertussis NOT DETECTED NOT DETECTED Final   Chlamydophila pneumoniae NOT DETECTED NOT DETECTED Final   Mycoplasma pneumoniae NOT DETECTED NOT DETECTED Final         Radiology Studies: Dg Chest Portable 1 View  Result Date: 05/08/2017 CLINICAL DATA:  Initial evaluation for acute weakness. EXAM: PORTABLE CHEST 1 VIEW COMPARISON:  Prior radiograph from 02/21/2017. FINDINGS: Mild cardiomegaly, stable.  Mediastinal silhouette normal. Lungs hypoinflated. No focal infiltrates. Mild left basilar atelectasis. No pulmonary edema or pleural effusion. No pneumothorax. No acute osseus abnormality. IMPRESSION: 1. Shallow lung inflation with mild left basilar atelectasis. 2. No other active cardiopulmonary disease. Electronically Signed   By: Jeannine Boga M.D.   On: 05/08/2017 22:33   Ct  Head Code Stroke Wo Contrast  Result Date: 05/08/2017 CLINICAL DATA:  Code stroke.  83 y/o  M; right-sided weakness. EXAM: CT HEAD WITHOUT CONTRAST TECHNIQUE: Contiguous axial images were obtained from the base of the skull through the vertex without intravenous contrast. COMPARISON:  02/20/2017 CT of the head. FINDINGS: Brain: No evidence of acute infarction, hemorrhage, hydrocephalus, extra-axial collection or mass lesion/mass effect. Stable chronic microvascular ischemic changes and parenchymal volume loss of the brain. Stable small chronic lacunar infarcts of the right cerebellar hemisphere. Vascular: Calcific atherosclerosis of carotid siphons. No hyperdense vessel identified. Skull: Normal. Negative for fracture or focal lesion. Sinuses/Orbits: No acute finding. Other: None. ASPECTS [facility] Stroke Program Early CT Score) - Ganglionic level infarction (caudate, lentiform nuclei, internal capsule, insula, M1-M3 cortex): 7 - Supraganglionic infarction (M4-M6 cortex): 3 Total score (0-10 with 10 being normal): 10 IMPRESSION: 1. No acute intracranial abnormality identified. 2. ASPECTS is 10 3. Stable chronic microvascular ischemic changes and parenchymal volume loss of the brain. These results were called by telephone at the time of interpretation on 05/08/2017 at 9:31 pm to Dr. Pattricia Boss , who verbally acknowledged these results. Electronically Signed   By: Kristine Garbe M.D.   On: 05/08/2017 21:32      Scheduled Meds: . Chlorhexidine Gluconate Cloth  6 each Topical Q0600  . divalproex  750 mg Oral Daily  . docusate sodium  100 mg Oral Daily  . donepezil  10 mg Oral QHS  . enoxaparin (LOVENOX) injection  40 mg Subcutaneous Q24H  . finasteride  5 mg Oral Daily  . guaiFENesin  1,200 mg Oral BID  . ketotifen  1 drop Both Eyes BID  . levothyroxine  50 mcg Oral QAC breakfast  . lithium carbonate  300 mg Oral QHS  . loratadine  10 mg Oral Daily  . memantine  10 mg  Oral QHS  . mupirocin  ointment  1 application Nasal BID  . nystatin  2 g Topical QPM  . potassium chloride  40 mEq Oral Q4H  . risperiDONE  0.5 mg Oral Daily  . risperiDONE  1 mg Oral QHS  . traZODone  100 mg Oral QHS   Continuous Infusions: . sodium chloride 125 mL/hr at 05/09/17 1223  . azithromycin Stopped (05/09/17 0258)  . cefTRIAXone (ROCEPHIN)  IV       LOS: 1 day    Time spent in minutes: 35    Debbe Odea, MD [facility] Pager: [email or web address] Password [email or web address] 05/09/2017, 3:52 PM

## 2017-05-10 DIAGNOSIS — E876 Hypokalemia: Secondary | ICD-10-CM

## 2017-05-10 LAB — CBC
HCT: 38.9 % — ABNORMAL LOW (ref 39.0–52.0)
HEMOGLOBIN: 12.5 g/dL — AB (ref 13.0–17.0)
MCH: 28.4 pg (ref 26.0–34.0)
MCHC: 32.1 g/dL (ref 30.0–36.0)
MCV: 88.4 fL (ref 78.0–100.0)
PLATELETS: 150 10*3/uL (ref 150–400)
RBC: 4.4 MIL/uL (ref 4.22–5.81)
RDW: 16.7 % — AB (ref 11.5–15.5)
WBC: 7.2 10*3/uL (ref 4.0–10.5)

## 2017-05-10 LAB — BASIC METABOLIC PANEL
Anion gap: 9 (ref 5–15)
BUN: 13 mg/dL (ref 6–20)
CALCIUM: 7.5 mg/dL — AB (ref 8.9–10.3)
CO2: 24 mmol/L (ref 22–32)
CREATININE: 0.99 mg/dL (ref 0.61–1.24)
Chloride: 101 mmol/L (ref 101–111)
GFR calc non Af Amer: >60 mL/min (ref >60)
GLUCOSE: 104 mg/dL — AB (ref 65–99)
Potassium: 3.4 mmol/L — ABNORMAL LOW (ref 3.5–5.1)
SODIUM: 134 mmol/L — AB (ref 135–145)

## 2017-05-10 LAB — EXPECTORATED SPUTUM ASSESSMENT W GRAM STAIN, RFLX TO RESP C

## 2017-05-10 LAB — EXPECTORATED SPUTUM ASSESSMENT W REFEX TO RESP CULTURE

## 2017-05-10 LAB — PROCALCITONIN

## 2017-05-10 MED ORDER — IBUPROFEN 400 MG PO TABS: 400.0000 mg | ORAL_TABLET | ORAL | Status: DC | PRN

## 2017-05-10 MED ORDER — ACETAMINOPHEN 325 MG PO TABS: 650.0000 mg | ORAL_TABLET | ORAL | Status: DC | PRN

## 2017-05-10 MED ORDER — ACETAMINOPHEN 650 MG RE SUPP: 650.0000 mg | Freq: Four times a day (QID) | RECTAL | Status: DC | PRN

## 2017-05-10 MED ORDER — SODIUM CHLORIDE 0.9 % IV BOLUS (SEPSIS)
500.0000 mL | Freq: Once | INTRAVENOUS | Status: AC
  Administered 2017-05-10: 500 mL via INTRAVENOUS

## 2017-05-10 MED ORDER — IBUPROFEN 400 MG PO TABS
400.0000 mg | ORAL_TABLET | Freq: Once | ORAL | Status: AC
  Administered 2017-05-10: 400 mg via ORAL
  Filled 2017-05-10: qty 1

## 2017-05-10 MED ORDER — ACETAMINOPHEN 650 MG RE SUPP
650.0000 mg | RECTAL | Status: DC | PRN
  Administered 2017-05-10: 650 mg via RECTAL
  Filled 2017-05-10: qty 1

## 2017-05-10 NOTE — Clinical Social Work Note (Signed)
Clinical Social Work Assessment  Patient Details  Name: Louis Patel MRN: [id number] Date of Birth: 1925-10-23  Date of referral:  05/10/17               Reason for consult:  Facility Placement                Permission sought to share information with:  Family Supports Permission granted to share information::     Name::     Rochele Raring  Relationship::  Daughter  Contact Information:  [phone]  Housing/Transportation Living arrangements for the past 2 months:  [place] ([place]) Source of Information:  Adult Children Patient Interpreter Needed:  None Criminal Activity/Legal Involvement Pertinent to Current Situation/Hospitalization:  No - Comment as needed Significant Relationships:  Adult Children Lives with:  Facility Resident Do you feel safe going back to the place where you live?  Yes Need for family participation in patient care:  Yes (Comment)  Care giving concerns:  Patient daughter expressed concern with patient return to [facility] as he has progressively declined physically.  Patient daughter requested that therapies evaluate for patient eligibility for SNF placement.   Social Worker assessment / plan:  [place] representative spoke with patient daughter over the phone to offer support and discuss patient needs at discharge.  Patient daughter states that patient is a resident at [facility] [place] [facility].  Patient daughter states that with medication changes patient physical abilities have declined and she is hopeful for rehab prior to return.  CSW to complete FL2 and initiate SNF search in [place] area.  CSW to follow up with patient daughter once bed offers are available.  CSW contacted RN to request therapy evaluations to determine if patient is appropriate for SNF level of care - once evaluations are completed, CSW to initiate insurance authorization.  Employment status:  Retired Designer, industrial/product PT Recommendations:  Not assessed at this time, [place] / Referral to community resources:  [place]  Patient/Family's Response to care:  Patient daughter verbalized understanding and appreciation of CSW role and involvement.  Patient daughter aware of patient need for additional rehab prior to return to [place].  Patient/Family's Understanding of and Emotional Response to Diagnosis, Current Treatment, and Prognosis:  Patient daughter understanding of patient current limitations and need for possible SNF placement at discharge.  Patient daughter is hopeful that patient will have continued improvement throughout hospital stay.  Emotional Assessment Appearance:  Appears stated age Attitude/Demeanor/Rapport:  Unable to Assess Affect (typically observed):  Unable to Assess Orientation:  Oriented to Self, Oriented to Place Alcohol / Substance use:  Not Applicable Psych involvement (Current and /or in the community):  No (Comment)  Discharge Needs  Concerns to be addressed:  Discharge Planning Concerns Readmission within the last 30 days:  No Current discharge risk:  Cognitively Impaired, Physical Impairment Barriers to Discharge:  Continued Medical Work up  [employer], [place]

## 2017-05-10 NOTE — Progress Notes (Signed)
Patient's temp continued to spike up and down throughout the night. [place],NP notified. This morning after getting Tylenol, patient temp continued to rise to 103.3 rectally. Blount,NP notified and placed order for one dose of 400mg  of ibuprofen with a fluid bolus. Ice packs also applied to patient's body. Patient received bolus and ibuprofen and temp went down to 98.7. Patient's HR also decreased back into the 90's. Will continue to monitor and treat per MD orders.

## 2017-05-10 NOTE — Progress Notes (Addendum)
PROGRESS NOTE    Louis Patel   FXJ:[id number]  DOB: March 05, 1926  DOA: 05/08/2017 PCP: Orvis Brill, [facility]   Brief Narrative:  Louis Patel is a 83 y.o. male with medical history significant of Dementia, HLD, HTN.  Patient was just diagnosed with PNA at his memory care [facility] yesterday. On the day of admission, he was noted to be normal around 5: 30 but around 6 PM he was found slumped in his chair with slurred speech, reported R sided weakness and was sent in to ED as a code stroke. Found to be hypoxic as well and mentation improved but the time he came to the ER Temp 100.5, Cr up from 1.0 to 1.4.  CXR did not show any infiltrates.   Spoke with his son who states that the patient has stopped walking over the past 3 wks. He has had issues with "fluid in his legs" lately. He was trying to get him transitioned from ALF to SNF but the patient was resistant.    Subjective:   ROS: no complaints of nausea, vomiting, constipation diarrhea, dyspnea or dysuria. No other complaints.   Assessment & Plan:   Principal Problem: Parainfluena upper respiratory tract infection - Rocephin, Zithromax started initially  - parainfluenza + on Resp virus panel-  - checked Procalcitonin and found to be negative and therefore d/c'd antibiotics - SLP eval- on pureed diet, nectar thick liquids at baseline- recommended to take D3 (mech soft) with thin liquids at this time with precautions - fevers at high at 103 this AM- quite congested and mostly lethargic  Active Problems:   Dementia with behavioral disturbance - cont Depakote, Namenda, Lithium, Aricept, Risperdal, Trazodone    AKI (acute kidney injury),  Hyponatremia   - Cr 1.40 >> 0.99 with fluid - sodium 134 - hold Aldactone- cont IVF- no prior ECHO to check EF but, per son, he had issues of pedal edema recently and I suspect this was a new medications - Cr better today  Hypokalemia - replacing again today  Anemia - Hb dropped  from 16to 12.5  DVT prophylaxis: Lovenox Code Status: DNR Family Communication:  Disposition Plan:  SNF  Consultants:   none Procedures:    Antimicrobials:  Anti-infectives    Start     Dose/Rate Route Frequency Ordered Stop   05/09/17 2200  cefTRIAXone (ROCEPHIN) 1 g in dextrose 5 % 50 mL IVPB  Status:  Discontinued     1 g 100 mL/hr over 30 Minutes Intravenous Every 24 hours 05/08/17 2306 05/10/17 0759   05/09/17 0000  azithromycin (ZITHROMAX) 500 mg in dextrose 5 % 250 mL IVPB  Status:  Discontinued     500 mg 250 mL/hr over 60 Minutes Intravenous Every 24 hours 05/08/17 2306 05/10/17 0759   05/08/17 2315  cefTRIAXone (ROCEPHIN) 1 g in dextrose 5 % 50 mL IVPB     1 g 100 mL/hr over 30 Minutes Intravenous  Once 05/08/17 2314 05/09/17 0221   05/08/17 2245  levofloxacin (LEVAQUIN) IVPB 750 mg  Status:  Discontinued     750 mg 100 mL/hr over 90 Minutes Intravenous  Once 05/08/17 2239 05/08/17 2306       Objective: Vitals:   05/10/17 0458 05/10/17 0625 05/10/17 0650 05/10/17 1443  BP:  (!) 97/47  (!) 110/46  Pulse:  92  83  Resp:  (!) 22  18  Temp: (!) 103.3 F (39.6 C) 98.7 F (37.1 C)  99.8 F (37.7 C)  TempSrc:  Rectal Oral  Oral  SpO2:  93%  96%  Weight:   93 kg (205 lb 1.6 oz)   Height:        Intake/Output Summary (Last 24 hours) at 05/10/17 1637 Last data filed at 05/10/17 0956  Gross per 24 hour  Intake          1327.92 ml  Output              200 ml  Net          1127.92 ml   Filed Weights   05/10/17 0650  Weight: 93 kg (205 lb 1.6 oz)    Examination: General exam: Appears comfortable  HEENT: PERRLA, oral mucosa moist, no sclera icterus or thrush Respiratory system:   Significant chest congestion on exam. Respiratory effort normal. Pulse ox 100% on room air. Cardiovascular system: S1 & S2 heard, RRR.  No murmurs  Gastrointestinal system: Abdomen soft, non-tender, nondistended. Normal bowel sound. No organomegaly Central nervous system: sleepy- No  focal neurological deficits. Extremities: No cyanosis, clubbing or edema Skin: No rashes or ulcers Psychiatry:  Mood & affect appropriate.     Data Reviewed: I have personally reviewed following labs and imaging studies  CBC:  Recent Labs Lab 05/08/17 2106 05/08/17 2115 05/09/17 0238 05/10/17 0801  WBC 8.2  --  7.2 7.2  NEUTROABS 6.4  --   --   --   HGB 15.3 16.0 13.3 12.5*  HCT 45.4 47.0 41.2 38.9*  MCV 88.0  --  88.0 88.4  PLT 182  --  183 497   Basic Metabolic Panel:  Recent Labs Lab 05/08/17 2106 05/08/17 2115 05/09/17 0238 05/09/17 1045 05/10/17 0801  NA 136 136 134*  --  134*  K 3.9 4.5 3.1*  --  3.4*  CL 96* 97* 94*  --  101  CO2 26  --  30  --  24  GLUCOSE 132* 133* 124*  --  104*  BUN 24* 34* 21*  --  13  CREATININE 1.47* 1.40* 1.22  --  0.99  CALCIUM 8.6*  --  8.2*  --  7.5*  MG  --   --   --  1.9  --    GFR: Estimated Creatinine Clearance: 54 mL/min (by C-G formula based on SCr of 0.99 mg/dL). Liver Function Tests:  Recent Labs Lab 05/08/17 2106  AST 35  ALT 25  ALKPHOS 60  BILITOT 1.4*  PROT 6.6  ALBUMIN 3.3*   No results for input(s): LIPASE, AMYLASE in the last 168 hours.  Recent Labs Lab 05/09/17 0238  AMMONIA 14   Coagulation Profile:  Recent Labs Lab 05/08/17 2106  INR 1.05   Cardiac Enzymes: No results for input(s): CKTOTAL, CKMB, CKMBINDEX, TROPONINI in the last 168 hours. BNP (last 3 results) No results for input(s): PROBNP in the last 8760 hours. HbA1C: No results for input(s): HGBA1C in the last 72 hours. CBG:  Recent Labs Lab 05/08/17 2105  GLUCAP 131*   Lipid Profile: No results for input(s): CHOL, HDL, LDLCALC, TRIG, CHOLHDL, LDLDIRECT in the last 72 hours. Thyroid Function Tests:  Recent Labs  05/09/17 0238  TSH 2.248   Anemia Panel: No results for input(s): VITAMINB12, FOLATE, FERRITIN, TIBC, IRON, RETICCTPCT in the last 72 hours. Urine analysis:    Component Value Date/Time   COLORURINE  YELLOW 05/09/2017 [place] 05/09/2017 1153   LABSPEC 1.020 05/09/2017 1153   PHURINE 6.0 05/09/2017 1153   GLUCOSEU NEGATIVE 05/09/2017 1153  HGBUR NEGATIVE 05/09/2017 1153   HGBUR negative 06/27/2009 0809   BILIRUBINUR NEGATIVE 05/09/2017 1153   BILIRUBINUR neg 07/14/2015 1124   KETONESUR 5 (A) 05/09/2017 1153   PROTEINUR NEGATIVE 05/09/2017 1153   UROBILINOGEN 0.2 07/14/2015 1124   UROBILINOGEN 0.2 10/10/2014 2256   NITRITE NEGATIVE 05/09/2017 1153   LEUKOCYTESUR NEGATIVE 05/09/2017 1153   Sepsis Labs: @LABRCNTIP (procalcitonin:4,lacticidven:4) ) Recent Results (from the past 240 hour(s))  Culture, blood (routine x 2) Call MD if unable to obtain prior to antibiotics being given     Status: None (Preliminary result)   Collection Time: 05/08/17 11:12 PM  Result Value Ref Range Status   Specimen Description BLOOD RIGHT ANTECUBITAL  Final   Special Requests   Final    BOTTLES DRAWN AEROBIC AND ANAEROBIC Blood Culture results may not be optimal due to an excessive volume of blood received in culture bottles   Culture NO GROWTH 1 DAY  Final   Report Status PENDING  Incomplete  Culture, blood (routine x 2) Call MD if unable to obtain prior to antibiotics being given     Status: None (Preliminary result)   Collection Time: 05/08/17 11:37 PM  Result Value Ref Range Status   Specimen Description BLOOD LEFT ARM  Final   Special Requests   Final    BOTTLES DRAWN AEROBIC AND ANAEROBIC Blood Culture results may not be optimal due to an excessive volume of blood received in culture bottles   Culture NO GROWTH 1 DAY  Final   Report Status PENDING  Incomplete  MRSA PCR Screening     Status: None   Collection Time: 05/09/17  1:16 AM  Result Value Ref Range Status   MRSA by PCR NEGATIVE NEGATIVE Final    Comment:        The GeneXpert MRSA Assay (FDA approved for NASAL specimens only), is one component of a comprehensive MRSA colonization surveillance program. It is  not intended to diagnose MRSA infection nor to guide or monitor treatment for MRSA infections.   Respiratory Panel by PCR     Status: Abnormal   Collection Time: 05/09/17  8:50 AM  Result Value Ref Range Status   Adenovirus NOT DETECTED NOT DETECTED Final   Coronavirus 229E NOT DETECTED NOT DETECTED Final   Coronavirus HKU1 NOT DETECTED NOT DETECTED Final   Coronavirus NL63 NOT DETECTED NOT DETECTED Final   Coronavirus OC43 NOT DETECTED NOT DETECTED Final   Metapneumovirus NOT DETECTED NOT DETECTED Final   Rhinovirus / Enterovirus NOT DETECTED NOT DETECTED Final   Influenza A NOT DETECTED NOT DETECTED Final   Influenza B NOT DETECTED NOT DETECTED Final   Parainfluenza Virus 1 NOT DETECTED NOT DETECTED Final   Parainfluenza Virus 2 DETECTED (A) NOT DETECTED Final   Parainfluenza Virus 3 NOT DETECTED NOT DETECTED Final   Parainfluenza Virus 4 NOT DETECTED NOT DETECTED Final   Respiratory Syncytial Virus NOT DETECTED NOT DETECTED Final   Bordetella pertussis NOT DETECTED NOT DETECTED Final   Chlamydophila pneumoniae NOT DETECTED NOT DETECTED Final   Mycoplasma pneumoniae NOT DETECTED NOT DETECTED Final  Culture, sputum-assessment     Status: None   Collection Time: 05/10/17 12:17 PM  Result Value Ref Range Status   Specimen Description SPUTUM  Final   Special Requests NONE  Final   Sputum evaluation THIS SPECIMEN IS ACCEPTABLE FOR SPUTUM CULTURE  Final   Report Status 05/10/2017 FINAL  Final  Culture, respiratory (NON-Expectorated)     Status: None (Preliminary result)   Collection  Time: 05/10/17 12:17 PM  Result Value Ref Range Status   Specimen Description SPUTUM  Final   Special Requests NONE Reflexed from [id number]  Final   Gram Stain   Final    ABUNDANT WBC PRESENT, PREDOMINANTLY PMN NO SQUAMOUS EPITHELIAL CELLS SEEN NO ORGANISMS SEEN    Culture PENDING  Incomplete   Report Status PENDING  Incomplete         Radiology Studies: Dg Chest Portable 1 View  Result  Date: 05/08/2017 CLINICAL DATA:  Initial evaluation for acute weakness. EXAM: PORTABLE CHEST 1 VIEW COMPARISON:  Prior radiograph from 02/21/2017. FINDINGS: Mild cardiomegaly, stable.  Mediastinal silhouette normal. Lungs hypoinflated. No focal infiltrates. Mild left basilar atelectasis. No pulmonary edema or pleural effusion. No pneumothorax. No acute osseus abnormality. IMPRESSION: 1. Shallow lung inflation with mild left basilar atelectasis. 2. No other active cardiopulmonary disease. Electronically Signed   By: Jeannine Boga M.D.   On: 05/08/2017 22:33   Ct Head Code Stroke Wo Contrast  Result Date: 05/08/2017 CLINICAL DATA:  Code stroke.  83 y/o  M; right-sided weakness. EXAM: CT HEAD WITHOUT CONTRAST TECHNIQUE: Contiguous axial images were obtained from the base of the skull through the vertex without intravenous contrast. COMPARISON:  02/20/2017 CT of the head. FINDINGS: Brain: No evidence of acute infarction, hemorrhage, hydrocephalus, extra-axial collection or mass lesion/mass effect. Stable chronic microvascular ischemic changes and parenchymal volume loss of the brain. Stable small chronic lacunar infarcts of the right cerebellar hemisphere. Vascular: Calcific atherosclerosis of carotid siphons. No hyperdense vessel identified. Skull: Normal. Negative for fracture or focal lesion. Sinuses/Orbits: No acute finding. Other: None. ASPECTS [facility] Stroke Program Early CT Score) - Ganglionic level infarction (caudate, lentiform nuclei, internal capsule, insula, M1-M3 cortex): 7 - Supraganglionic infarction (M4-M6 cortex): 3 Total score (0-10 with 10 being normal): 10 IMPRESSION: 1. No acute intracranial abnormality identified. 2. ASPECTS is 10 3. Stable chronic microvascular ischemic changes and parenchymal volume loss of the brain. These results were called by telephone at the time of interpretation on 05/08/2017 at 9:31 pm to Dr. Pattricia Boss , who verbally acknowledged these results.  Electronically Signed   By: Kristine Garbe M.D.   On: 05/08/2017 21:32      Scheduled Meds: . Chlorhexidine Gluconate Cloth  6 each Topical Q0600  . divalproex  750 mg Oral Daily  . docusate sodium  100 mg Oral Daily  . donepezil  10 mg Oral QHS  . enoxaparin (LOVENOX) injection  40 mg Subcutaneous Q24H  . finasteride  5 mg Oral Daily  . guaiFENesin  1,200 mg Oral BID  . ketotifen  1 drop Both Eyes BID  . levothyroxine  50 mcg Oral QAC breakfast  . lithium carbonate  300 mg Oral QHS  . loratadine  10 mg Oral Daily  . memantine  10 mg Oral QHS  . mupirocin ointment  1 application Nasal BID  . nystatin  2 g Topical QPM  . risperiDONE  0.5 mg Oral Daily  . risperiDONE  1 mg Oral QHS  . traZODone  100 mg Oral QHS   Continuous Infusions: . sodium chloride 75 mL/hr at 05/10/17 1207     LOS: 2 days    Time spent in minutes: 35    Debbe Odea, MD [facility] Pager: [email or web address] Password [facility] 05/10/2017, 4:37 PM

## 2017-05-11 DIAGNOSIS — B338 Other specified viral diseases: Secondary | ICD-10-CM

## 2017-05-11 DIAGNOSIS — J9600 Acute respiratory failure, unspecified whether with hypoxia or hypercapnia: Secondary | ICD-10-CM

## 2017-05-11 DIAGNOSIS — B348 Other viral infections of unspecified site: Secondary | ICD-10-CM

## 2017-05-11 DIAGNOSIS — J9601 Acute respiratory failure with hypoxia: Secondary | ICD-10-CM

## 2017-05-11 DIAGNOSIS — E860 Dehydration: Secondary | ICD-10-CM

## 2017-05-11 DIAGNOSIS — F0390 Unspecified dementia without behavioral disturbance: Secondary | ICD-10-CM

## 2017-05-11 DIAGNOSIS — R972 Elevated prostate specific antigen [PSA]: Secondary | ICD-10-CM

## 2017-05-11 LAB — BASIC METABOLIC PANEL
Anion gap: 10 (ref 5–15)
BUN: 12 mg/dL (ref 6–20)
CALCIUM: 8 mg/dL — AB (ref 8.9–10.3)
CO2: 24 mmol/L (ref 22–32)
CREATININE: 1.03 mg/dL (ref 0.61–1.24)
Chloride: 102 mmol/L (ref 101–111)
GFR calc non Af Amer: >60 mL/min (ref >60)
Glucose, Bld: 116 mg/dL — ABNORMAL HIGH (ref 65–99)
Potassium: 3.2 mmol/L — ABNORMAL LOW (ref 3.5–5.1)
SODIUM: 136 mmol/L (ref 135–145)

## 2017-05-11 LAB — CBC
HCT: 40.3 % (ref 39.0–52.0)
Hemoglobin: 13 g/dL (ref 13.0–17.0)
MCH: 28.5 pg (ref 26.0–34.0)
MCHC: 32.3 g/dL (ref 30.0–36.0)
MCV: 88.4 fL (ref 78.0–100.0)
PLATELETS: 167 10*3/uL (ref 150–400)
RBC: 4.56 MIL/uL (ref 4.22–5.81)
RDW: 16.6 % — AB (ref 11.5–15.5)
WBC: 8.7 10*3/uL (ref 4.0–10.5)

## 2017-05-11 LAB — PROCALCITONIN: Procalcitonin: <0.1 ng/mL

## 2017-05-11 MED ORDER — POTASSIUM CHLORIDE CRYS ER 20 MEQ PO TBCR
40.0000 meq | EXTENDED_RELEASE_TABLET | ORAL | Status: AC
  Administered 2017-05-11 (×2): 40 meq via ORAL
  Filled 2017-05-11 (×2): qty 2

## 2017-05-11 NOTE — Progress Notes (Signed)
PROGRESS NOTE    Louis Patel   JGO:[id number]  DOB: 1926/04/22  DOA: 05/08/2017 PCP: Orvis Brill, [facility]   Brief Narrative:  Louis Patel is a 83 y.o. male with medical history significant of Dementia, HLD, HTN.  Patient was just diagnosed with PNA at his memory care AL yesterday. On the day of admission, he was noted to be normal around 5: 30 but around 6 PM he was found slumped in his chair with slurred speech, reported R sided weakness and was sent in to ED as a code stroke. Found to be hypoxic as well and mentation improved but the time he came to the ER Temp 100.5, Cr up from 1.0 to 1.4.  CXR did not show any infiltrates.   Spoke with his son who states that the patient has stopped walking over the past 3 wks. He has had issues with "fluid in his legs" lately. He was trying to get him transitioned from ALF to SNF but the patient was resistant.    Subjective:  much more alert today. He has no complaints.  ROS: no complaints of nausea, vomiting, constipation diarrhea, dyspnea or dysuria. No other complaints.   Assessment & Plan:   Principal Problem: Parainfluena upper respiratory tract infection - Rocephin, Zithromax started initially  - parainfluenza + on Resp virus panel-  - checked Procalcitonin and found to be negative and therefore d/c'd antibiotics - SLP eval- on pureed diet, nectar thick liquids at baseline- recommended to take D3 (mech soft) with thin liquids at this time with precautions - fevers at high at 103 on 11/3- fever curve improving- more alert and less congested - sputum cultures shows no organisms   Active Problems:   Dementia with behavioral disturbance - cont Depakote, Namenda, Lithium, Aricept, Risperdal, Trazodone    AKI (acute kidney injury),  Hyponatremia   - Cr 1.40   - sodium 134 - hold Aldactone- cont IVF- no prior ECHO to check EF but, per son, he had issues of pedal edema recently and I suspect this was a new medications  - Cr has improved to normal- sodium 136  Skin breakdown - on left leg due to ruptured bulla- no infection noted- cont dressings per wound care  Hypokalemia - replacing again today  Anemia - Hb dropped from 16 to 12.5- in the past Hb has ranged from 13-15.9  DVT prophylaxis: Lovenox Code Status: DNR Family Communication: son Disposition Plan:  SNF - the patient agrees Consultants:   none Procedures:    Antimicrobials:  Anti-infectives (From admission, onward)   Start     Dose/Rate Route Frequency Ordered Stop   05/09/17 2200  cefTRIAXone (ROCEPHIN) 1 g in dextrose 5 % 50 mL IVPB  Status:  Discontinued     1 g 100 mL/hr over 30 Minutes Intravenous Every 24 hours 05/08/17 2306 05/10/17 0759   05/09/17 0000  azithromycin (ZITHROMAX) 500 mg in dextrose 5 % 250 mL IVPB  Status:  Discontinued     500 mg 250 mL/hr over 60 Minutes Intravenous Every 24 hours 05/08/17 2306 05/10/17 0759   05/08/17 2315  cefTRIAXone (ROCEPHIN) 1 g in dextrose 5 % 50 mL IVPB     1 g 100 mL/hr over 30 Minutes Intravenous  Once 05/08/17 2314 05/09/17 0221   05/08/17 2245  levofloxacin (LEVAQUIN) IVPB 750 mg  Status:  Discontinued     750 mg 100 mL/hr over 90 Minutes Intravenous  Once 05/08/17 2239 05/08/17 2306  Objective: Vitals:   05/10/17 2129 05/11/17 0030 05/11/17 0433 05/11/17 1435  BP: (!) 92/56  (!) 141/71 139/64  Pulse: 70  87 63  Resp: 20  20 16   Temp: (!) 100.9 F (38.3 C) 98.8 F (37.1 C) 98 F (36.7 C) 97.6 F (36.4 C)  TempSrc: Oral Oral Oral Oral  SpO2: 95%  95% 100%  Weight:      Height:        Intake/Output Summary (Last 24 hours) at 05/11/2017 1514 Last data filed at 05/11/2017 0900 Gross per 24 hour  Intake 1633 ml  Output -  Net 1633 ml   Filed Weights   05/10/17 0650  Weight: 93 kg (205 lb 1.6 oz)    Examination: General exam: Appears comfortable  HEENT: PERRLA, oral mucosa moist, no sclera icterus or thrush Respiratory system:   Significant chest  congestion on exam. Respiratory effort normal. Pulse ox 100% on room air. Cardiovascular system: S1 & S2 heard, RRR.  No murmurs  Gastrointestinal system: Abdomen soft, non-tender, nondistended. Normal bowel sound. No organomegaly Central nervous system: AAO today-  No focal neurological deficits. Extremities: No cyanosis, clubbing or edema Skin: extensive skin breakdown on left leg due to ruptured bulla- Psychiatry:  Mood & affect appropriate.     Data Reviewed: I have personally reviewed following labs and imaging studies  CBC: Recent Labs  Lab 05/08/17 2106 05/08/17 2115 05/09/17 0238 05/10/17 0801 05/11/17 0507  WBC 8.2  --  7.2 7.2 8.7  NEUTROABS 6.4  --   --   --   --   HGB 15.3 16.0 13.3 12.5* 13.0  HCT 45.4 47.0 41.2 38.9* 40.3  MCV 88.0  --  88.0 88.4 88.4  PLT 182  --  183 150 295   Basic Metabolic Panel: Recent Labs  Lab 05/08/17 2106 05/08/17 2115 05/09/17 0238 05/09/17 1045 05/10/17 0801 05/11/17 0507  NA 136 136 134*  --  134* 136  K 3.9 4.5 3.1*  --  3.4* 3.2*  CL 96* 97* 94*  --  101 102  CO2 26  --  30  --  24 24  GLUCOSE 132* 133* 124*  --  104* 116*  BUN 24* 34* 21*  --  13 12  CREATININE 1.47* 1.40* 1.22  --  0.99 1.03  CALCIUM 8.6*  --  8.2*  --  7.5* 8.0*  MG  --   --   --  1.9  --   --    GFR: Estimated Creatinine Clearance: 51.9 mL/min (by C-G formula based on SCr of 1.03 mg/dL). Liver Function Tests: Recent Labs  Lab 05/08/17 2106  AST 35  ALT 25  ALKPHOS 60  BILITOT 1.4*  PROT 6.6  ALBUMIN 3.3*   No results for input(s): LIPASE, AMYLASE in the last 168 hours. Recent Labs  Lab 05/09/17 0238  AMMONIA 14   Coagulation Profile: Recent Labs  Lab 05/08/17 2106  INR 1.05   Cardiac Enzymes: No results for input(s): CKTOTAL, CKMB, CKMBINDEX, TROPONINI in the last 168 hours. BNP (last 3 results) No results for input(s): PROBNP in the last 8760 hours. HbA1C: No results for input(s): HGBA1C in the last 72 hours. CBG: Recent  Labs  Lab 05/08/17 2105  GLUCAP 131*   Lipid Profile: No results for input(s): CHOL, HDL, LDLCALC, TRIG, CHOLHDL, LDLDIRECT in the last 72 hours. Thyroid Function Tests: Recent Labs    05/09/17 0238  TSH 2.248   Anemia Panel: No results for input(s): VITAMINB12, FOLATE,  FERRITIN, TIBC, IRON, RETICCTPCT in the last 72 hours. Urine analysis:    Component Value Date/Time   COLORURINE YELLOW 05/09/2017 1153   APPEARANCEUR CLEAR 05/09/2017 1153   LABSPEC 1.020 05/09/2017 1153   PHURINE 6.0 05/09/2017 1153   GLUCOSEU NEGATIVE 05/09/2017 1153   HGBUR NEGATIVE 05/09/2017 1153   HGBUR negative 06/27/2009 0809   BILIRUBINUR NEGATIVE 05/09/2017 1153   BILIRUBINUR neg 07/14/2015 1124   KETONESUR 5 (A) 05/09/2017 1153   PROTEINUR NEGATIVE 05/09/2017 1153   UROBILINOGEN 0.2 07/14/2015 1124   UROBILINOGEN 0.2 10/10/2014 2256   NITRITE NEGATIVE 05/09/2017 1153   LEUKOCYTESUR NEGATIVE 05/09/2017 1153   Sepsis Labs: @LABRCNTIP (procalcitonin:4,lacticidven:4) ) Recent Results (from the past 240 hour(s))  Culture, blood (routine x 2) Call MD if unable to obtain prior to antibiotics being given     Status: None (Preliminary result)   Collection Time: 05/08/17 11:12 PM  Result Value Ref Range Status   Specimen Description BLOOD RIGHT ANTECUBITAL  Final   Special Requests   Final    BOTTLES DRAWN AEROBIC AND ANAEROBIC Blood Culture results may not be optimal due to an excessive volume of blood received in culture bottles   Culture NO GROWTH 1 DAY  Final   Report Status PENDING  Incomplete  Culture, blood (routine x 2) Call MD if unable to obtain prior to antibiotics being given     Status: None (Preliminary result)   Collection Time: 05/08/17 11:37 PM  Result Value Ref Range Status   Specimen Description BLOOD LEFT ARM  Final   Special Requests   Final    BOTTLES DRAWN AEROBIC AND ANAEROBIC Blood Culture results may not be optimal due to an excessive volume of blood received in culture  bottles   Culture NO GROWTH 1 DAY  Final   Report Status PENDING  Incomplete  MRSA PCR Screening     Status: None   Collection Time: 05/09/17  1:16 AM  Result Value Ref Range Status   MRSA by PCR NEGATIVE NEGATIVE Final    Comment:        The GeneXpert MRSA Assay (FDA approved for NASAL specimens only), is one component of a comprehensive MRSA colonization surveillance program. It is not intended to diagnose MRSA infection nor to guide or monitor treatment for MRSA infections.   Respiratory Panel by PCR     Status: Abnormal   Collection Time: 05/09/17  8:50 AM  Result Value Ref Range Status   Adenovirus NOT DETECTED NOT DETECTED Final   Coronavirus 229E NOT DETECTED NOT DETECTED Final   Coronavirus HKU1 NOT DETECTED NOT DETECTED Final   Coronavirus NL63 NOT DETECTED NOT DETECTED Final   Coronavirus OC43 NOT DETECTED NOT DETECTED Final   Metapneumovirus NOT DETECTED NOT DETECTED Final   Rhinovirus / Enterovirus NOT DETECTED NOT DETECTED Final   Influenza A NOT DETECTED NOT DETECTED Final   Influenza B NOT DETECTED NOT DETECTED Final   Parainfluenza Virus 1 NOT DETECTED NOT DETECTED Final   Parainfluenza Virus 2 DETECTED (A) NOT DETECTED Final   Parainfluenza Virus 3 NOT DETECTED NOT DETECTED Final   Parainfluenza Virus 4 NOT DETECTED NOT DETECTED Final   Respiratory Syncytial Virus NOT DETECTED NOT DETECTED Final   Bordetella pertussis NOT DETECTED NOT DETECTED Final   Chlamydophila pneumoniae NOT DETECTED NOT DETECTED Final   Mycoplasma pneumoniae NOT DETECTED NOT DETECTED Final  Culture, sputum-assessment     Status: None   Collection Time: 05/10/17 12:17 PM  Result Value Ref Range Status  Specimen Description SPUTUM  Final   Special Requests NONE  Final   Sputum evaluation THIS SPECIMEN IS ACCEPTABLE FOR SPUTUM CULTURE  Final   Report Status 05/10/2017 FINAL  Final  Culture, respiratory (NON-Expectorated)     Status: None (Preliminary result)   Collection Time:  05/10/17 12:17 PM  Result Value Ref Range Status   Specimen Description SPUTUM  Final   Special Requests NONE Reflexed from [id number]  Final   Gram Stain   Final    ABUNDANT WBC PRESENT, PREDOMINANTLY PMN NO SQUAMOUS EPITHELIAL CELLS SEEN NO ORGANISMS SEEN    Culture TOO YOUNG TO READ  Final   Report Status PENDING  Incomplete         Radiology Studies: No results found.    Scheduled Meds: . Chlorhexidine Gluconate Cloth  6 each Topical Q0600  . divalproex  750 mg Oral Daily  . docusate sodium  100 mg Oral Daily  . donepezil  10 mg Oral QHS  . enoxaparin (LOVENOX) injection  40 mg Subcutaneous Q24H  . finasteride  5 mg Oral Daily  . guaiFENesin  1,200 mg Oral BID  . ketotifen  1 drop Both Eyes BID  . levothyroxine  50 mcg Oral QAC breakfast  . lithium carbonate  300 mg Oral QHS  . loratadine  10 mg Oral Daily  . memantine  10 mg Oral QHS  . mupirocin ointment  1 application Nasal BID  . nystatin  2 g Topical QPM  . risperiDONE  0.5 mg Oral Daily  . risperiDONE  1 mg Oral QHS  . traZODone  100 mg Oral QHS   Continuous Infusions: . sodium chloride 75 mL/hr at 05/10/17 1207     LOS: 3 days    Time spent in minutes: 35    Debbe Odea, MD [facility] Pager: [email or web address] Password [email or web address] 05/11/2017, 3:14 PM

## 2017-05-11 NOTE — Discharge Summary (Addendum)
Physician Discharge Summary  Louis Patel ATF:[id number] DOB: 09-15-1925 DOA: 05/08/2017  PCP: Orvis Brill, [facility]  Admit date: 05/08/2017 Discharge date: 05/12/2017  Admitted From: ALF Disposition:  SNF   Recommendations for Outpatient Follow-up:  1. Wound care for left leg- cont ACE wrap 2. TEDS for right leg 3. Avoid diuretics and keep legs elevated to level of abdomen or above if swollen 4.         Wound care: xeroform gauze to promote drying and healing, ace wrap for light compression. 5.          Follow for respiratory superinfection   Discharge Condition:  stable CODE STATUS:  DNR   Consultations:  none    Discharge Diagnoses:  Principal Problem:   Parainfluenza virus infection Active Problems:   AKI (acute kidney injury) ([place])   Hyponatremia   Hypokalemia   Acute respiratory failure (HCC)   PSA, INCREASED   Mild dementia     Subjective: Has cough and congestion. No dyspnea. No other complaints.   Brief Summary: Louis Patel is a 83 y.o.malewith medical history significant of Dementia, HLD, HTN. Patient was just diagnosed with PNA at his memory care AL yesterday. On the day of admission, he was noted to be normal around 5: 30 but around 6 PM he was found slumped in his chair with slurred speech, reported R sided weakness and was sent in to ED as a code stroke. Found to be hypoxic as well and mentation improved but the time he came to the ER Temp 100.5, Cr up from 1.0 to 1.4.  CXR did not show any infiltrates.   Spoke with his son who states that the patient has stopped walking over the past 3 wks. He has had issues with "fluid in his legs" lately. He was trying to get him transitioned from ALF to SNF but the patient was resistant.    Hospital Course:  Principal Problem: Parainfluena upper respiratory tract infection Acute respiratory failure - Rocephin, Zithromax started initially - he had been given a dose of Levquin at the ALF the  day before - parainfluenza noted to be + on Resp virus panel-  - checked Procalcitonin and found to be negative and therefore d/c'd antibiotics- last dose was on 11/2 - SLP eval- on pureed diet, nectar thick liquids at baseline- recommended to take D3 (mech soft) with thin liquids at this time with precautions - fevers at high at 103 on 11/3- fever curve improving- more alert and less congested - sputum cultures shows no organisms - blood cultures negative - cough and congestion are improving  Active Problems:   Mild Dementia   - CT head unrevealing - cont Depakote, Namenda, Lithium, Aricept, Risperdal, Trazodone    AKI (acute kidney injury),  Hyponatremia   - Cr 1.47  and sodium 134 at the lowest - hold Aldactone- cont IVF- no prior ECHO to document EF but, per son, he had issues of pedal edema recently and I suspect this was a new medications  - Cr has improved to normal- sodium 141 today   Hypokalemia - replaced adequately  Partial thickness skin wound / pedal edema - son explained that the patient had not been walking much and had developed severe edema for which he was started on a diuretic - eventually developed skin breakdown on left shin and calf (10X12X.2cm) due to ruptured bulla- no infection noted- cont dressings per wound care  Anemia - Hb dropped from 16 to 13.6 -  in the past Hb has ranged from 13-15.9   Obesity Body mass index is 33.83 kg/m.   Severe deconditioning - PT eval >> SNF   Discharge Instructions  Discharge Instructions    Diet - low sodium heart healthy   Complete by:  As directed    Increase activity slowly   Complete by:  As directed      Allergies as of 05/12/2017   No Known Allergies     Medication List    STOP taking these medications   hydrochlorothiazide 25 MG tablet Commonly known as:  HYDRODIURIL   levofloxacin 750 MG tablet Commonly known as:  LEVAQUIN   spironolactone 25 MG tablet Commonly known as:  ALDACTONE    traZODone 100 MG tablet Commonly known as:  DESYREL     TAKE these medications   acetaminophen 325 MG tablet Commonly known as:  TYLENOL Take 2 tablets (650 mg total) every 4 (four) hours as needed by mouth for mild pain (or Fever >/= 101).   azelastine 0.05 % ophthalmic solution Commonly known as:  OPTIVAR Place 1 drop into both eyes daily.   Chlorhexidine Gluconate Cloth 2 % Pads Apply 6 each daily at 6 (six) AM for 1 day topically. Start taking on:  05/13/2017   divalproex 250 MG 24 hr tablet Commonly known as:  DEPAKOTE ER Take 250 mg by mouth daily. Take with depakote er 500 mg to equal 750 mg   divalproex 500 MG 24 hr tablet Commonly known as:  DEPAKOTE ER Take 500 mg by mouth daily. Take with depakote er 250 to equal 750 mg   docusate sodium 100 MG capsule Commonly known as:  COLACE Take 100 mg by mouth daily.   donepezil 5 MG tablet Commonly known as:  ARICEPT Take 10 mg by mouth at bedtime.   fexofenadine 180 MG tablet Commonly known as:  ALLEGRA Take 180 mg by mouth daily.   finasteride 5 MG tablet Commonly known as:  PROSCAR Take 1 tablet (5 mg total) by mouth as directed. What changed:  when to take this   Guaifenesin 1200 MG Tb12 Take 1,200 mg by mouth 2 (two) times daily. For 12 days (started 05/08/17)   levothyroxine 50 MCG tablet Commonly known as:  SYNTHROID, LEVOTHROID Take 1 tablet (50 mcg total) by mouth daily.   lithium carbonate 300 MG capsule Take 300 mg by mouth at bedtime.   LORazepam 0.5 MG tablet Commonly known as:  ATIVAN Take 0.5 mg by mouth every 8 (eight) hours as needed for anxiety.   memantine 10 MG tablet Commonly known as:  NAMENDA Take 10 mg by mouth at bedtime.   mupirocin ointment 2 % Commonly known as:  BACTROBAN Place 1 application 2 (two) times daily for 2 days into the nose.   neomycin-bacitracin-polymyxin ointment Commonly known as:  NEOSPORIN Apply 1 application topically every evening. To left elbow    nystatin powder Commonly known as:  MYCOSTATIN/NYSTOP Apply 2 g topically every evening. To groin   risperiDONE 0.5 MG tablet Commonly known as:  RISPERDAL Take 0.5 mg by mouth daily.   risperiDONE 1 MG tablet Commonly known as:  RISPERDAL Take 1 mg by mouth at bedtime.   risperiDONE 1 MG tablet Commonly known as:  RISPERDAL Take 1 mg by mouth every 6 (six) hours as needed (for Behaviors).   ROLLATOR ULTRA-LIGHT Misc 1 Rollator walker to use and needed   Commode Bedside Misc 1 device to use as needed   Commode Bedside  Misc Use Device daily as needed.       No Known Allergies   Procedures/Studies:   Dg Chest Portable 1 View  Result Date: 05/08/2017 CLINICAL DATA:  Initial evaluation for acute weakness. EXAM: PORTABLE CHEST 1 VIEW COMPARISON:  Prior radiograph from 02/21/2017. FINDINGS: Mild cardiomegaly, stable.  Mediastinal silhouette normal. Lungs hypoinflated. No focal infiltrates. Mild left basilar atelectasis. No pulmonary edema or pleural effusion. No pneumothorax. No acute osseus abnormality. IMPRESSION: 1. Shallow lung inflation with mild left basilar atelectasis. 2. No other active cardiopulmonary disease. Electronically Signed   By: Jeannine Boga M.D.   On: 05/08/2017 22:33   Ct Head Code Stroke Wo Contrast  Result Date: 05/08/2017 CLINICAL DATA:  Code stroke.  83 y/o  M; right-sided weakness. EXAM: CT HEAD WITHOUT CONTRAST TECHNIQUE: Contiguous axial images were obtained from the base of the skull through the vertex without intravenous contrast. COMPARISON:  02/20/2017 CT of the head. FINDINGS: Brain: No evidence of acute infarction, hemorrhage, hydrocephalus, extra-axial collection or mass lesion/mass effect. Stable chronic microvascular ischemic changes and parenchymal volume loss of the brain. Stable small chronic lacunar infarcts of the right cerebellar hemisphere. Vascular: Calcific atherosclerosis of carotid siphons. No hyperdense vessel identified.  Skull: Normal. Negative for fracture or focal lesion. Sinuses/Orbits: No acute finding. Other: None. ASPECTS [facility] Stroke Program Early CT Score) - Ganglionic level infarction (caudate, lentiform nuclei, internal capsule, insula, M1-M3 cortex): 7 - Supraganglionic infarction (M4-M6 cortex): 3 Total score (0-10 with 10 being normal): 10 IMPRESSION: 1. No acute intracranial abnormality identified. 2. ASPECTS is 10 3. Stable chronic microvascular ischemic changes and parenchymal volume loss of the brain. These results were called by telephone at the time of interpretation on 05/08/2017 at 9:31 pm to Dr. Pattricia Boss , who verbally acknowledged these results. Electronically Signed   By: Kristine Garbe M.D.   On: 05/08/2017 21:32       Discharge Exam: Vitals:   05/11/17 2222 05/12/17 0539  BP: (!) 149/81 (!) 149/59  Pulse: 61 61  Resp: 17 17  Temp: 98.3 F (36.8 C) 98.5 F (36.9 C)  SpO2: 98% 99%   Vitals:   05/11/17 1435 05/11/17 2222 05/12/17 0539 05/12/17 0918  BP: 139/64 (!) 149/81 (!) 149/59   Pulse: 63 61 61   Resp: 16 17 17    Temp: 97.6 F (36.4 C) 98.3 F (36.8 C) 98.5 F (36.9 C)   TempSrc: Oral Oral Oral   SpO2: 100% 98% 99%   Weight:    95.1 kg (209 lb 9.6 oz)  Height:        General: Pt is alert, awake, not in acute distress Cardiovascular: RRR, S1/S2 +, no rubs, no gallops Respiratory: CTA bilaterally, no wheezing, no rhonchi- mild cough noted  Abdominal: Soft, NT, ND, bowel sounds + Extremities: no edema, no cyanosis Skin: large area of denuded skin on left shin extended to calf with good granulation tissue    The results of significant diagnostics from this hospitalization (including imaging, microbiology, ancillary and laboratory) are listed below for reference.     Microbiology: Recent Results (from the past 240 hour(s))  Culture, blood (routine x 2) Call MD if unable to obtain prior to antibiotics being given     Status: None (Preliminary result)    Collection Time: 05/08/17 11:12 PM  Result Value Ref Range Status   Specimen Description BLOOD RIGHT ANTECUBITAL  Final   Special Requests   Final    BOTTLES DRAWN AEROBIC AND ANAEROBIC Blood Culture results  may not be optimal due to an excessive volume of blood received in culture bottles   Culture NO GROWTH 3 DAYS  Final   Report Status PENDING  Incomplete  Culture, blood (routine x 2) Call MD if unable to obtain prior to antibiotics being given     Status: None (Preliminary result)   Collection Time: 05/08/17 11:37 PM  Result Value Ref Range Status   Specimen Description BLOOD LEFT ARM  Final   Special Requests   Final    BOTTLES DRAWN AEROBIC AND ANAEROBIC Blood Culture results may not be optimal due to an excessive volume of blood received in culture bottles   Culture NO GROWTH 3 DAYS  Final   Report Status PENDING  Incomplete  MRSA PCR Screening     Status: None   Collection Time: 05/09/17  1:16 AM  Result Value Ref Range Status   MRSA by PCR NEGATIVE NEGATIVE Final    Comment:        The GeneXpert MRSA Assay (FDA approved for NASAL specimens only), is one component of a comprehensive MRSA colonization surveillance program. It is not intended to diagnose MRSA infection nor to guide or monitor treatment for MRSA infections.   Respiratory Panel by PCR     Status: Abnormal   Collection Time: 05/09/17  8:50 AM  Result Value Ref Range Status   Adenovirus NOT DETECTED NOT DETECTED Final   Coronavirus 229E NOT DETECTED NOT DETECTED Final   Coronavirus HKU1 NOT DETECTED NOT DETECTED Final   Coronavirus NL63 NOT DETECTED NOT DETECTED Final   Coronavirus OC43 NOT DETECTED NOT DETECTED Final   Metapneumovirus NOT DETECTED NOT DETECTED Final   Rhinovirus / Enterovirus NOT DETECTED NOT DETECTED Final   Influenza A NOT DETECTED NOT DETECTED Final   Influenza B NOT DETECTED NOT DETECTED Final   Parainfluenza Virus 1 NOT DETECTED NOT DETECTED Final   Parainfluenza Virus 2 DETECTED  (A) NOT DETECTED Final   Parainfluenza Virus 3 NOT DETECTED NOT DETECTED Final   Parainfluenza Virus 4 NOT DETECTED NOT DETECTED Final   Respiratory Syncytial Virus NOT DETECTED NOT DETECTED Final   Bordetella pertussis NOT DETECTED NOT DETECTED Final   Chlamydophila pneumoniae NOT DETECTED NOT DETECTED Final   Mycoplasma pneumoniae NOT DETECTED NOT DETECTED Final  Culture, sputum-assessment     Status: None   Collection Time: 05/10/17 12:17 PM  Result Value Ref Range Status   Specimen Description SPUTUM  Final   Special Requests NONE  Final   Sputum evaluation THIS SPECIMEN IS ACCEPTABLE FOR SPUTUM CULTURE  Final   Report Status 05/10/2017 FINAL  Final  Culture, respiratory (NON-Expectorated)     Status: None (Preliminary result)   Collection Time: 05/10/17 12:17 PM  Result Value Ref Range Status   Specimen Description SPUTUM  Final   Special Requests NONE Reflexed from [id number]  Final   Gram Stain   Final    ABUNDANT WBC PRESENT, PREDOMINANTLY PMN NO SQUAMOUS EPITHELIAL CELLS SEEN NO ORGANISMS SEEN    Culture TOO YOUNG TO READ  Final   Report Status PENDING  Incomplete     Labs: BNP (last 3 results) Recent Labs    02/20/17 1611  BNP 51.7   Basic Metabolic Panel: Recent Labs  Lab 05/08/17 2106 05/08/17 2115 05/09/17 0238 05/09/17 1045 05/10/17 0801 05/11/17 0507 05/12/17 0800  NA 136 136 134*  --  134* 136 141  K 3.9 4.5 3.1*  --  3.4* 3.2* 3.9  CL 96* 97* 94*  --  101 102 107  CO2 26  --  30  --  24 24 26   GLUCOSE 132* 133* 124*  --  104* 116* 124*  BUN 24* 34* 21*  --  13 12 16   CREATININE 1.47* 1.40* 1.22  --  0.99 1.03 0.81  CALCIUM 8.6*  --  8.2*  --  7.5* 8.0* 8.5*  MG  --   --   --  1.9  --   --   --    Liver Function Tests: Recent Labs  Lab 05/08/17 2106  AST 35  ALT 25  ALKPHOS 60  BILITOT 1.4*  PROT 6.6  ALBUMIN 3.3*   No results for input(s): LIPASE, AMYLASE in the last 168 hours. Recent Labs  Lab 05/09/17 0238  AMMONIA 14    CBC: Recent Labs  Lab 05/08/17 2106 05/08/17 2115 05/09/17 0238 05/10/17 0801 05/11/17 0507 05/12/17 0800  WBC 8.2  --  7.2 7.2 8.7 7.2  NEUTROABS 6.4  --   --   --   --   --   HGB 15.3 16.0 13.3 12.5* 13.0 13.6  HCT 45.4 47.0 41.2 38.9* 40.3 41.8  MCV 88.0  --  88.0 88.4 88.4 88.0  PLT 182  --  183 150 167 184   Cardiac Enzymes: No results for input(s): CKTOTAL, CKMB, CKMBINDEX, TROPONINI in the last 168 hours. BNP: Invalid input(s): POCBNP CBG: Recent Labs  Lab 05/08/17 2105  GLUCAP 131*   D-Dimer No results for input(s): DDIMER in the last 72 hours. Hgb A1c No results for input(s): HGBA1C in the last 72 hours. Lipid Profile No results for input(s): CHOL, HDL, LDLCALC, TRIG, CHOLHDL, LDLDIRECT in the last 72 hours. Thyroid function studies No results for input(s): TSH, T4TOTAL, T3FREE, THYROIDAB in the last 72 hours.  Invalid input(s): FREET3 Anemia work up No results for input(s): VITAMINB12, FOLATE, FERRITIN, TIBC, IRON, RETICCTPCT in the last 72 hours. Urinalysis    Component Value Date/Time   COLORURINE YELLOW 05/09/2017 1153   APPEARANCEUR CLEAR 05/09/2017 1153   LABSPEC 1.020 05/09/2017 1153   PHURINE 6.0 05/09/2017 1153   GLUCOSEU NEGATIVE 05/09/2017 1153   HGBUR NEGATIVE 05/09/2017 1153   HGBUR negative 06/27/2009 0809   BILIRUBINUR NEGATIVE 05/09/2017 1153   BILIRUBINUR neg 07/14/2015 1124   KETONESUR 5 (A) 05/09/2017 1153   PROTEINUR NEGATIVE 05/09/2017 1153   UROBILINOGEN 0.2 07/14/2015 1124   UROBILINOGEN 0.2 10/10/2014 2256   NITRITE NEGATIVE 05/09/2017 1153   LEUKOCYTESUR NEGATIVE 05/09/2017 1153   Sepsis Labs Invalid input(s): PROCALCITONIN,  WBC,  LACTICIDVEN Microbiology Recent Results (from the past 240 hour(s))  Culture, blood (routine x 2) Call MD if unable to obtain prior to antibiotics being given     Status: None (Preliminary result)   Collection Time: 05/08/17 11:12 PM  Result Value Ref Range Status   Specimen Description  BLOOD RIGHT ANTECUBITAL  Final   Special Requests   Final    BOTTLES DRAWN AEROBIC AND ANAEROBIC Blood Culture results may not be optimal due to an excessive volume of blood received in culture bottles   Culture NO GROWTH 3 DAYS  Final   Report Status PENDING  Incomplete  Culture, blood (routine x 2) Call MD if unable to obtain prior to antibiotics being given     Status: None (Preliminary result)   Collection Time: 05/08/17 11:37 PM  Result Value Ref Range Status   Specimen Description BLOOD LEFT ARM  Final   Special Requests   Final  BOTTLES DRAWN AEROBIC AND ANAEROBIC Blood Culture results may not be optimal due to an excessive volume of blood received in culture bottles   Culture NO GROWTH 3 DAYS  Final   Report Status PENDING  Incomplete  MRSA PCR Screening     Status: None   Collection Time: 05/09/17  1:16 AM  Result Value Ref Range Status   MRSA by PCR NEGATIVE NEGATIVE Final    Comment:        The GeneXpert MRSA Assay (FDA approved for NASAL specimens only), is one component of a comprehensive MRSA colonization surveillance program. It is not intended to diagnose MRSA infection nor to guide or monitor treatment for MRSA infections.   Respiratory Panel by PCR     Status: Abnormal   Collection Time: 05/09/17  8:50 AM  Result Value Ref Range Status   Adenovirus NOT DETECTED NOT DETECTED Final   Coronavirus 229E NOT DETECTED NOT DETECTED Final   Coronavirus HKU1 NOT DETECTED NOT DETECTED Final   Coronavirus NL63 NOT DETECTED NOT DETECTED Final   Coronavirus OC43 NOT DETECTED NOT DETECTED Final   Metapneumovirus NOT DETECTED NOT DETECTED Final   Rhinovirus / Enterovirus NOT DETECTED NOT DETECTED Final   Influenza A NOT DETECTED NOT DETECTED Final   Influenza B NOT DETECTED NOT DETECTED Final   Parainfluenza Virus 1 NOT DETECTED NOT DETECTED Final   Parainfluenza Virus 2 DETECTED (A) NOT DETECTED Final   Parainfluenza Virus 3 NOT DETECTED NOT DETECTED Final    Parainfluenza Virus 4 NOT DETECTED NOT DETECTED Final   Respiratory Syncytial Virus NOT DETECTED NOT DETECTED Final   Bordetella pertussis NOT DETECTED NOT DETECTED Final   Chlamydophila pneumoniae NOT DETECTED NOT DETECTED Final   Mycoplasma pneumoniae NOT DETECTED NOT DETECTED Final  Culture, sputum-assessment     Status: None   Collection Time: 05/10/17 12:17 PM  Result Value Ref Range Status   Specimen Description SPUTUM  Final   Special Requests NONE  Final   Sputum evaluation THIS SPECIMEN IS ACCEPTABLE FOR SPUTUM CULTURE  Final   Report Status 05/10/2017 FINAL  Final  Culture, respiratory (NON-Expectorated)     Status: None (Preliminary result)   Collection Time: 05/10/17 12:17 PM  Result Value Ref Range Status   Specimen Description SPUTUM  Final   Special Requests NONE Reflexed from [id number]  Final   Gram Stain   Final    ABUNDANT WBC PRESENT, PREDOMINANTLY PMN NO SQUAMOUS EPITHELIAL CELLS SEEN NO ORGANISMS SEEN    Culture TOO YOUNG TO READ  Final   Report Status PENDING  Incomplete     Time coordinating discharge: Over 30 minutes  SIGNED:   Debbe Odea, MD  [facility] 05/12/2017, 11:04 AM Pager   If 7PM-7AM, please contact night-coverage [email or web address] Password [email or web address]

## 2017-05-11 NOTE — NC FL2 (Signed)
[place] MEDICAID FL2 LEVEL OF CARE SCREENING TOOL     IDENTIFICATION  Patient Name: Louis Patel Birthdate: [birth year] Sex: male Admission Date (Current Location): 05/08/2017  [facility] and [place] Number:  Herbalist and Address:  [facility], [place] [address], [place], [place] [address]      Provider Number: [id number]  Attending Physician Name and Address:  Debbe Odea, MD  Relative Name and Phone Number:       Current Level of Care: Hospital Recommended Level of Care: [place] Prior Approval Number:    Date Approved/Denied:   PASRR Number:  [id number]  Discharge Plan: SNF    Current Diagnoses: Patient Active Problem List   Diagnosis Date Noted  . Parainfluenza virus infection 05/11/2017  . Acute respiratory failure ([place]) 05/11/2017  . AKI (acute kidney injury) ([place]) 05/09/2017  . Hyponatremia 05/09/2017  . Aggressive behavior of adult   . Dementia with behavioral disturbance 02/21/2017  . Mild dementia 06/06/2016  . Hypothyroidism 01/25/2016  . UTI (urinary tract infection) 06/19/2015  . Obesity (BMI 30-39.9) 02/22/2013  . Memory loss 01/22/2011  . MOLE 05/22/2010  . DEPRESSIVE DISORDER 05/22/2010  . ALLERGIC RHINITIS CAUSE UNSPECIFIED 10/17/2008  . PSA, INCREASED 05/23/2008  . Hyperlipidemia 02/26/2007  . HEMORRHOIDS, EXTERNAL 02/26/2007    Orientation RESPIRATION BLADDER Height & Weight     Self, Time  Normal Incontinent Weight: 205 lb 1.6 oz (93 kg) Height:  5\' 6"  (167.6 cm)  BEHAVIORAL SYMPTOMS/MOOD NEUROLOGICAL BOWEL NUTRITION STATUS        Diet(DYS3)  AMBULATORY STATUS COMMUNICATION OF NEEDS Skin   Extensive Assist Verbally                         Personal Care Assistance Level of Assistance  Bathing, Feeding, Dressing Bathing Assistance: Limited assistance Feeding assistance: Independent Dressing Assistance: Limited assistance     Functional Limitations Info              SPECIAL CARE FACTORS FREQUENCY  PT (By licensed PT), OT (By licensed OT)     PT Frequency: 5 OT Frequency: 5            Contractures      Additional Factors Info  Code Status, Allergies Code Status Info: DNR Allergies Info: NKA            Current Medications (05/11/2017):  This is the current hospital active medication list Current Facility-Administered Medications  Medication Dose Route Frequency Provider Last Rate Last Dose  . 0.9 %  sodium chloride infusion   Intravenous Continuous Debbe Odea, MD 75 mL/hr at 05/10/17 1207    . acetaminophen (TYLENOL) suppository 650 mg  650 mg Rectal Q4H PRN Lovey Newcomer T, NP   650 mg at 05/10/17 0346   Or  . acetaminophen (TYLENOL) tablet 650 mg  650 mg Oral Q4H PRN Blount, Lolita Cram, NP      . Chlorhexidine Gluconate Cloth 2 % PADS 6 each  6 each Topical Q0600 Dory Horn, MD   6 each at 05/11/17 0442  . divalproex (DEPAKOTE ER) 24 hr tablet 750 mg  750 mg Oral Daily Jennette Kettle M, DO   750 mg at 05/11/17 3557  . docusate sodium (COLACE) capsule 100 mg  100 mg Oral Daily Jennette Kettle M, DO   100 mg at 05/11/17 0807  . donepezil (ARICEPT) tablet 10 mg  10 mg Oral QHS Etta Quill, DO  10 mg at 05/10/17 2115  . enoxaparin (LOVENOX) injection 40 mg  40 mg Subcutaneous Q24H Jennette Kettle M, DO   40 mg at 05/11/17 1147  . finasteride (PROSCAR) tablet 5 mg  5 mg Oral Daily Jennette Kettle M, DO   5 mg at 05/11/17 8676  . guaiFENesin (MUCINEX) 12 hr tablet 1,200 mg  1,200 mg Oral BID Jennette Kettle M, DO   1,200 mg at 05/11/17 1950  . ibuprofen (ADVIL,MOTRIN) tablet 400 mg  400 mg Oral Q4H PRN Debbe Odea, MD      . ketotifen (ZADITOR) 0.025 % ophthalmic solution 1 drop  1 drop Both Eyes BID Etta Quill, DO   1 drop at 05/11/17 0809  . levothyroxine (SYNTHROID, LEVOTHROID) tablet 50 mcg  50 mcg Oral QAC breakfast Etta Quill, DO   50 mcg at 05/11/17 9326  . lithium carbonate capsule 300 mg  300 mg Oral QHS  Jennette Kettle M, DO   300 mg at 05/10/17 2115  . loratadine (CLARITIN) tablet 10 mg  10 mg Oral Daily Jennette Kettle M, DO   10 mg at 05/11/17 0809  . LORazepam (ATIVAN) tablet 0.5 mg  0.5 mg Oral Q8H PRN Etta Quill, DO      . memantine [facility]) tablet 10 mg  10 mg Oral QHS Jennette Kettle M, DO   10 mg at 05/10/17 2115  . mupirocin ointment (BACTROBAN) 2 % 1 application  1 application Nasal BID Dory Horn, MD   1 application at 71/24/58 0809  . nystatin (MYCOSTATIN/NYSTOP) topical powder 2 g  2 g Topical QPM Jennette Kettle M, DO   2 g at 05/10/17 1800  . ondansetron (ZOFRAN) tablet 4 mg  4 mg Oral Q6H PRN Etta Quill, DO       Or  . ondansetron [facility]) injection 4 mg  4 mg Intravenous Q6H PRN Etta Quill, DO      . risperiDONE (RISPERDAL) tablet 0.5 mg  0.5 mg Oral Daily Jennette Kettle M, DO   0.5 mg at 05/11/17 0810  . risperiDONE (RISPERDAL) tablet 1 mg  1 mg Oral QHS Jennette Kettle M, DO   1 mg at 05/10/17 2115  . risperiDONE (RISPERDAL) tablet 1 mg  1 mg Oral Q6H PRN Etta Quill, DO      . traZODone (DESYREL) tablet 100 mg  100 mg Oral QHS Jennette Kettle M, DO   100 mg at 05/10/17 2115     Discharge Medications: Please see discharge summary for a list of discharge medications.  Relevant Imaging Results:  Relevant Lab Results:   Additional Information SS#: [id number]  Weston Anna, LCSW

## 2017-05-11 NOTE — Evaluation (Signed)
Physical Therapy Evaluation Patient Details Name: Louis Patel MRN: [id number] DOB: Jun 03, 1926 Today's Date: 05/11/2017   History of Present Illness  Louis Patel is a 83 y.o.male who currently resides in memory care ALF with medical history significant of Dementia, HLD, HTN, PNA. Admitted with slurred speech, R sided weakness, hypoxic. Son states that the patient has stopped walking over the past 3 wks.  Clinical Impression  Pt incontinent of stool. - helped pt with clean up and linen change.  Pt with intermittent alertness/ would fall asleep and we had to keep waking him up.  Pt needed max assist x 2 for rolling and bed mobitly.  Pt will need more help at DC than he can get in memory care ALF.  At this point pt needs to get OOB via lift to help change positions and help with positional changes - pt very congested.    Follow Up Recommendations SNF    Equipment Recommendations  None recommended by PT    Recommendations for Other Services       Precautions / Restrictions Precautions Precautions: Fall Restrictions Weight Bearing Restrictions: No      Mobility  Bed Mobility Overal bed mobility: Needs Assistance Bed Mobility: Rolling Rolling: Max assist;+2 for physical assistance         General bed mobility comments: Pt rolled x 6 (3 each way) per peri care and bed linen change. Pt was able to assist with L more than R with bending knee and holding on the rail to assist with rolling  Transfers                 General transfer comment: not safe to attempt at this time  Ambulation/Gait                Stairs            Wheelchair Mobility    Modified Rankin (Stroke Patients Only)       Balance                                             Pertinent Vitals/Pain Pain Assessment: No/denies pain    Home Living Family/patient expects to be discharged to:: Skilled nursing facility                 Additional  Comments: Currently resides at [facility]    Prior Function           Comments: unsure as Pt is not reliable historian - Per chart Pt has not been walking for the past 3 weeks but prior to that he was independent in ADL     Hand Dominance        Extremity/Trunk Assessment   Upper Extremity Assessment Upper Extremity Assessment: Generalized weakness    Lower Extremity Assessment Lower Extremity Assessment: Generalized weakness       Communication   Communication: No difficulties(pt would often fall asleep while talking)  Cognition Arousal/Alertness: Lethargic Behavior During Therapy: Flat affect Overall Cognitive Status: No family/caregiver present to determine baseline cognitive functioning                                 General Comments: Baseline Dementia      General Comments General comments (skin integrity, edema, etc.): Pt oriented to  self, place    Exercises     Assessment/Plan    PT Assessment Patient needs continued PT services  PT Problem List Decreased strength;Decreased mobility;Decreased activity tolerance;Cardiopulmonary status limiting activity       PT Treatment Interventions Gait training;Functional mobility training;Therapeutic activities;Therapeutic exercise;Balance training    PT Goals (Current goals can be found in the Care Plan section)  Acute Rehab PT Goals Patient Stated Goal: none stated PT Goal Formulation: With patient Time For Goal Achievement: 05/17/17 Potential to Achieve Goals: Fair    Frequency Min 2X/week   Barriers to discharge   pt is going to need more assist than offered at ALF    Co-evaluation PT/OT/SLP Co-Evaluation/Treatment: Yes Reason for Co-Treatment: For patient/therapist safety;Necessary to address cognition/behavior during functional activity PT goals addressed during session: Mobility/safety with mobility OT goals addressed during session: ADL's and self-care       AM-PAC PT "6  Clicks" Daily Activity  Outcome Measure Difficulty turning over in bed (including adjusting bedclothes, sheets and blankets)?: A Lot Difficulty moving from lying on back to sitting on the side of the bed? : A Lot Difficulty sitting down on and standing up from a chair with arms (e.g., wheelchair, bedside commode, etc,.)?: Unable Help needed moving to and from a bed to chair (including a wheelchair)?: Total Help needed walking in hospital room?: Total Help needed climbing 3-5 steps with a railing? : Total 6 Click Score: 8    End of Session     Patient left: in bed;with call bell/phone within reach;with bed alarm set Nurse Communication: Mobility status PT Visit Diagnosis: Adult, failure to thrive (R62.7);Difficulty in walking, not elsewhere classified (R26.2)    Time: 1937-9024 PT Time Calculation (min) (ACUTE ONLY): 40 min   Charges:   PT Evaluation $PT Eval Low Complexity: 1 Low PT Treatments $Self Care/Home Management: 8-22   PT G Codes:       05-25-17   Rande Lawman, PT   Loyal Buba 05/25/17, 11:15 AM

## 2017-05-11 NOTE — Evaluation (Signed)
Occupational Therapy Evaluation Patient Details Name: Louis Patel MRN: [id number] DOB: 09-16-1925 Today's Date: 05/11/2017    History of Present Illness Louis Patel is a 83 y.o.male who currently resides in memory care ALF with medical history significant of Dementia, HLD, HTN, PNA. Admitted with slurred speech, R sided weakness, hypoxic. Son states that the patient has stopped walking over the past 3 wks.   Clinical Impression   PTA Pt resided in memory care unit and was independent in ADL. For the past 3 weeks he has been dependent in ADL and stopped walking. Pt is currently max to total A for all ADL and max A +2 for bed mobility. Pt will benefit from skilled OT in the acute setting and will require OT at the SNF level to maximize safety and independence in ADL and funtcional transfers to get Pt as close as possible to PLOF. Next session to hopefully get EOB to work on balance for seating grooming tasks and activity tolerance.     Follow Up Recommendations  SNF;Supervision/Assistance - 24 hour    Equipment Recommendations  Other (comment)(defer to next venue of care)    Recommendations for Other Services       Precautions / Restrictions Precautions Precautions: Fall Restrictions Weight Bearing Restrictions: No      Mobility Bed Mobility Overal bed mobility: Needs Assistance Bed Mobility: Rolling Rolling: Max assist;+2 for physical assistance         General bed mobility comments: Pt rolled x 6 (3 each way) per peri care and bed linen change. Pt was able to assist with L more than R with bending knee and holding on the rail to assist with rolling  Transfers                 General transfer comment: not safe to attempt at this time    Balance                                           ADL either performed or assessed with clinical judgement   ADL Overall ADL's : Needs assistance/impaired Eating/Feeding: Maximal assistance    Grooming: Maximal assistance   Upper Body Bathing: Maximal assistance   Lower Body Bathing: Total assistance   Upper Body Dressing : Maximal assistance   Lower Body Dressing: Total assistance   Toilet Transfer: Total assistance   Toileting- Clothing Manipulation and Hygiene: Bed level;+2 for physical assistance;Maximal assistance Toileting - Clothing Manipulation Details (indicate cue type and reason): rolling for peri care     Functional mobility during ADLs: (not safe to attempt at this time) General ADL Comments: Pt is max A for all aspects of ADL at this time due to lethargy, and generalized weakness/decreased activity tolerance     Vision   Additional Comments: too lethargic for visual testing at this time. Pt required verbal cues to open eyes throughout session     Perception     Praxis      Pertinent Vitals/Pain Pain Assessment: No/denies pain     Hand Dominance     Extremity/Trunk Assessment Upper Extremity Assessment Upper Extremity Assessment: Generalized weakness(Right side is weaker than Left)   Lower Extremity Assessment Lower Extremity Assessment: Defer to PT evaluation       Communication Communication Communication: No difficulties   Cognition Arousal/Alertness: Lethargic Behavior During Therapy: Flat affect Overall Cognitive Status: No family/caregiver  present to determine baseline cognitive functioning                                 General Comments: Baseline Dementia   General Comments  Pt oriented to self, place    Exercises     Shoulder Instructions      Home Living Family/patient expects to be discharged to:: Skilled nursing facility                                 Additional Comments: Currently resides at [facility]      Prior Functioning/Environment          Comments: unsure as Pt is not reliable historian - Per chart Pt has not been walking for the past 3 weeks but prior to that he was  independent in ADL        OT Problem List: Decreased strength;Decreased range of motion;Decreased activity tolerance;Impaired balance (sitting and/or standing);Decreased cognition;Decreased safety awareness      OT Treatment/Interventions: Self-care/ADL training;Therapeutic exercise;DME and/or AE instruction;Therapeutic activities;Patient/family education;Balance training    OT Goals(Current goals can be found in the care plan section) Acute Rehab OT Goals Patient Stated Goal: none stated OT Goal Formulation: Patient unable to participate in goal setting ADL Goals Pt Will Perform Grooming: with mod assist;sitting Pt Will Transfer to Toilet: with max assist;stand pivot transfer Pt Will Perform Toileting - Clothing Manipulation and hygiene: with max assist;sit to/from stand Additional ADL Goal #1: Pt will perform bed mobility as precursor for ADL at mod A level  OT Frequency: Min 2X/week   Barriers to D/C:            Co-evaluation PT/OT/SLP Co-Evaluation/Treatment: Yes Reason for Co-Treatment: Necessary to address cognition/behavior during functional activity;For patient/therapist safety   OT goals addressed during session: ADL's and self-care      AM-PAC PT "6 Clicks" Daily Activity     Outcome Measure Help from another person eating meals?: A Lot Help from another person taking care of personal grooming?: A Lot Help from another person toileting, which includes using toliet, bedpan, or urinal?: Total Help from another person bathing (including washing, rinsing, drying)?: A Lot Help from another person to put on and taking off regular upper body clothing?: A Lot Help from another person to put on and taking off regular lower body clothing?: Total 6 Click Score: 10   End of Session Nurse Communication: Mobility status;Other (comment)(toilet needs met by therapy)  Activity Tolerance: Patient limited by lethargy Patient left: in bed;with call bell/phone within reach;with bed  alarm set(reviewed how to use nurse call bell with Pt prior to exit)  OT Visit Diagnosis: Muscle weakness (generalized) (M62.81);Unsteadiness on feet (R26.81);Other abnormalities of gait and mobility (R26.89);History of falling (Z91.81);Other symptoms and signs involving cognitive function                Time: 0160-1093 OT Time Calculation (min): 45 min Charges:  OT General Charges $OT Visit: 1 Visit OT Evaluation $OT Eval Moderate Complexity: 1 Mod OT Treatments $Self Care/Home Management : 8-22 mins G-Codes:    Hulda Humphrey OTR/L [place] 05/11/2017, 9:45 AM

## 2017-05-12 DIAGNOSIS — E876 Hypokalemia: Secondary | ICD-10-CM

## 2017-05-12 LAB — BASIC METABOLIC PANEL WITH GFR
Anion gap: 8 (ref 5–15)
BUN: 16 mg/dL (ref 6–20)
CO2: 26 mmol/L (ref 22–32)
Calcium: 8.5 mg/dL — ABNORMAL LOW (ref 8.9–10.3)
Chloride: 107 mmol/L (ref 101–111)
Creatinine, Ser: 0.81 mg/dL (ref 0.61–1.24)
GFR calc Af Amer: >60 mL/min
GFR calc non Af Amer: >60 mL/min
Glucose, Bld: 124 mg/dL — ABNORMAL HIGH (ref 65–99)
Potassium: 3.9 mmol/L (ref 3.5–5.1)
Sodium: 141 mmol/L (ref 135–145)

## 2017-05-12 LAB — CULTURE, RESPIRATORY W GRAM STAIN: Culture: NORMAL

## 2017-05-12 LAB — CBC
HEMATOCRIT: 41.8 % (ref 39.0–52.0)
HEMOGLOBIN: 13.6 g/dL (ref 13.0–17.0)
MCH: 28.6 pg (ref 26.0–34.0)
MCHC: 32.5 g/dL (ref 30.0–36.0)
MCV: 88 fL (ref 78.0–100.0)
Platelets: 184 10*3/uL (ref 150–400)
RBC: 4.75 MIL/uL (ref 4.22–5.81)
RDW: 16.9 % — ABNORMAL HIGH (ref 11.5–15.5)
WBC: 7.2 10*3/uL (ref 4.0–10.5)

## 2017-05-12 MED ORDER — CHLORHEXIDINE GLUCONATE CLOTH 2 % EX PADS: 6.0000 | MEDICATED_PAD | Freq: Every day | CUTANEOUS | Status: AC

## 2017-05-12 MED ORDER — ACETAMINOPHEN 325 MG PO TABS: 650.0000 mg | ORAL_TABLET | ORAL | Status: AC | PRN

## 2017-05-12 MED ORDER — MUPIROCIN 2 % EX OINT: 1.0000 "application " | TOPICAL_OINTMENT | Freq: Two times a day (BID) | CUTANEOUS | 0 refills | Status: AC

## 2017-05-12 NOTE — Progress Notes (Signed)
Patient will DC to: [facility] Anticipated DC date: 05/12/17 Family notified: Daughter Transport by: Corey Harold   Per MD patient ready for DC to [facility]. RN, patient, patient's family, and facility notified of DC. Discharge Summary sent to facility. RN given number for report [phone]). DC packet on chart. Ambulance transport requested for patient.   CSW signing off.  Cedric Fishman, [facility] Social Worker [phone]

## 2017-05-12 NOTE — Clinical Social Work Placement (Signed)
   CLINICAL SOCIAL WORK PLACEMENT  NOTE  Date:  05/12/2017  Patient Details  Name: Louis Patel MRN: [id number] Date of Birth: 1926-05-18  Clinical Social Work is seeking post-discharge placement for this patient at the [facility] level of care (*CSW will initial, date and re-position this form in  chart as items are completed):  Yes   Patient/family provided with [facility] Work Department's list of facilities offering this level of care within the geographic area requested by the patient (or if unable, by the patient's family).  Yes   Patient/family informed of their freedom to choose among providers that offer the needed level of care, that participate in Medicare, Medicaid or managed care program needed by the patient, have an available bed and are willing to accept the patient.  Yes   Patient/family informed of [facility]'s ownership interest in [facility] and [facility], as well as of the fact that they are under no obligation to receive care at these facilities.  PASRR submitted to EDS on 05/12/17     PASRR number received on 05/12/17     Existing PASRR number confirmed on       FL2 transmitted to all facilities in geographic area requested by pt/family on 05/12/17     FL2 transmitted to all facilities within larger geographic area on       Patient informed that his/her managed care company has contracts with or will negotiate with certain facilities, including the following:        Yes   Patient/family informed of bed offers received.  Patient chooses bed at [facility]     Physician recommends and patient chooses bed at      Patient to be transferred to [facility] on 05/12/17.  Patient to be transferred to facility by [facility]     Patient family notified on 05/12/17 of transfer.  Name of family member notified:  Daughter     PHYSICIAN       Additional Comment:     _______________________________________________ Benard Halsted, [place] 05/12/2017, 3:07 PM

## 2017-05-12 NOTE — Progress Notes (Signed)
CSW submitted info to [facility] for pre-authorization to [facility].  Louis Patel LCSWA [phone]

## 2017-05-14 LAB — CULTURE, BLOOD (ROUTINE X 2)
CULTURE: NO GROWTH
Culture: NO GROWTH

## 2017-06-06 ENCOUNTER — Ambulatory Visit: Payer: Medicare Other | Admitting: Neurology

## 2017-07-04 ENCOUNTER — Ambulatory Visit: Payer: Medicare Other | Admitting: Neurology

## 2017-08-11 ENCOUNTER — Inpatient Hospital Stay (HOSPITAL_COMMUNITY): Payer: Medicare Other

## 2017-08-11 ENCOUNTER — Encounter (HOSPITAL_COMMUNITY): Payer: Self-pay | Admitting: Radiology

## 2017-08-11 ENCOUNTER — Emergency Department (HOSPITAL_COMMUNITY): Payer: Medicare Other

## 2017-08-11 ENCOUNTER — Inpatient Hospital Stay (HOSPITAL_COMMUNITY)
Admission: EM | Admit: 2017-08-11 | Discharge: 2017-08-13 | DRG: 871 | Disposition: A | Discharge status: Hospice | Payer: Medicare Other | Attending: Internal Medicine | Admitting: Internal Medicine

## 2017-08-11 DIAGNOSIS — E785 Hyperlipidemia, unspecified: Secondary | ICD-10-CM | POA: Diagnosis present

## 2017-08-11 DIAGNOSIS — I10 Essential (primary) hypertension: Secondary | ICD-10-CM | POA: Diagnosis present

## 2017-08-11 DIAGNOSIS — J9601 Acute respiratory failure with hypoxia: Secondary | ICD-10-CM | POA: Diagnosis not present

## 2017-08-11 DIAGNOSIS — F03A0 Unspecified dementia, mild, without behavioral disturbance, psychotic disturbance, mood disturbance, and anxiety: Secondary | ICD-10-CM | POA: Diagnosis present

## 2017-08-11 DIAGNOSIS — F329 Major depressive disorder, single episode, unspecified: Secondary | ICD-10-CM | POA: Diagnosis present

## 2017-08-11 DIAGNOSIS — Z79899 Other long term (current) drug therapy: Secondary | ICD-10-CM | POA: Diagnosis not present

## 2017-08-11 DIAGNOSIS — F0390 Unspecified dementia without behavioral disturbance: Secondary | ICD-10-CM | POA: Diagnosis present

## 2017-08-11 DIAGNOSIS — Z66 Do not resuscitate: Secondary | ICD-10-CM | POA: Diagnosis present

## 2017-08-11 DIAGNOSIS — Z515 Encounter for palliative care: Secondary | ICD-10-CM | POA: Diagnosis not present

## 2017-08-11 DIAGNOSIS — F32A Depression, unspecified: Secondary | ICD-10-CM | POA: Diagnosis present

## 2017-08-11 DIAGNOSIS — A4102 Sepsis due to Methicillin resistant Staphylococcus aureus: Secondary | ICD-10-CM | POA: Diagnosis present

## 2017-08-11 DIAGNOSIS — Z87891 Personal history of nicotine dependence: Secondary | ICD-10-CM

## 2017-08-11 DIAGNOSIS — J690 Pneumonitis due to inhalation of food and vomit: Secondary | ICD-10-CM

## 2017-08-11 DIAGNOSIS — R6520 Severe sepsis without septic shock: Secondary | ICD-10-CM | POA: Diagnosis not present

## 2017-08-11 DIAGNOSIS — A419 Sepsis, unspecified organism: Secondary | ICD-10-CM

## 2017-08-11 DIAGNOSIS — I82419 Acute embolism and thrombosis of unspecified femoral vein: Secondary | ICD-10-CM | POA: Diagnosis not present

## 2017-08-11 DIAGNOSIS — L8990 Pressure ulcer of unspecified site, unspecified stage: Secondary | ICD-10-CM | POA: Diagnosis present

## 2017-08-11 DIAGNOSIS — E039 Hypothyroidism, unspecified: Secondary | ICD-10-CM | POA: Diagnosis present

## 2017-08-11 DIAGNOSIS — L89154 Pressure ulcer of sacral region, stage 4: Secondary | ICD-10-CM | POA: Diagnosis not present

## 2017-08-11 DIAGNOSIS — J9600 Acute respiratory failure, unspecified whether with hypoxia or hypercapnia: Secondary | ICD-10-CM | POA: Diagnosis present

## 2017-08-11 DIAGNOSIS — I82413 Acute embolism and thrombosis of femoral vein, bilateral: Secondary | ICD-10-CM | POA: Diagnosis present

## 2017-08-11 DIAGNOSIS — I2699 Other pulmonary embolism without acute cor pulmonale: Secondary | ICD-10-CM

## 2017-08-11 DIAGNOSIS — I82409 Acute embolism and thrombosis of unspecified deep veins of unspecified lower extremity: Secondary | ICD-10-CM | POA: Diagnosis present

## 2017-08-11 DIAGNOSIS — N400 Enlarged prostate without lower urinary tract symptoms: Secondary | ICD-10-CM | POA: Diagnosis present

## 2017-08-11 DIAGNOSIS — R52 Pain, unspecified: Secondary | ICD-10-CM

## 2017-08-11 LAB — I-STAT CG4 LACTIC ACID, ED
LACTIC ACID, VENOUS: 2.13 mmol/L — AB (ref 0.5–1.9)
Lactic Acid, Venous: 2.61 mmol/L (ref 0.5–1.9)

## 2017-08-11 LAB — I-STAT ARTERIAL BLOOD GAS, ED
Acid-Base Excess: 4 mmol/L — ABNORMAL HIGH (ref 0.0–2.0)
Bicarbonate: 27.8 mmol/L (ref 20.0–28.0)
O2 SAT: 90 %
PCO2 ART: 40.4 mmHg (ref 32.0–48.0)
PH ART: 7.452 — AB (ref 7.350–7.450)
TCO2: 29 mmol/L (ref 22–32)
pO2, Arterial: 61 mmHg — ABNORMAL LOW (ref 83.0–108.0)

## 2017-08-11 LAB — COMPREHENSIVE METABOLIC PANEL
ALBUMIN: 1.9 g/dL — AB (ref 3.5–5.0)
ALT: 28 U/L (ref 17–63)
AST: 35 U/L (ref 15–41)
Alkaline Phosphatase: 72 U/L (ref 38–126)
Anion gap: 11 (ref 5–15)
BILIRUBIN TOTAL: 1.3 mg/dL — AB (ref 0.3–1.2)
BUN: 39 mg/dL — AB (ref 6–20)
CHLORIDE: 116 mmol/L — AB (ref 101–111)
CO2: 24 mmol/L (ref 22–32)
CREATININE: 1.26 mg/dL — AB (ref 0.61–1.24)
Calcium: 7.9 mg/dL — ABNORMAL LOW (ref 8.9–10.3)
GFR calc Af Amer: 57 mL/min — ABNORMAL LOW (ref >60)
GFR, EST NON AFRICAN AMERICAN: 49 mL/min — AB (ref >60)
Glucose, Bld: 133 mg/dL — ABNORMAL HIGH (ref 65–99)
POTASSIUM: 3.9 mmol/L (ref 3.5–5.1)
Sodium: 151 mmol/L — ABNORMAL HIGH (ref 135–145)
Total Protein: 5.5 g/dL — ABNORMAL LOW (ref 6.5–8.1)

## 2017-08-11 LAB — CBC WITH DIFFERENTIAL/PLATELET
BASOS ABS: 0 10*3/uL (ref 0.0–0.1)
BASOS PCT: 0 %
Basophils Absolute: 0 10*3/uL (ref 0.0–0.1)
Basophils Relative: 0 %
EOS ABS: 0 10*3/uL (ref 0.0–0.7)
EOS PCT: 0 %
Eosinophils Absolute: 0.1 10*3/uL (ref 0.0–0.7)
Eosinophils Relative: 1 %
HEMATOCRIT: 38.5 % — AB (ref 39.0–52.0)
HEMATOCRIT: 32.5 % — AB (ref 39.0–52.0)
HEMOGLOBIN: 11.8 g/dL — AB (ref 13.0–17.0)
HEMOGLOBIN: 9.7 g/dL — AB (ref 13.0–17.0)
LYMPHS PCT: 5 %
LYMPHS PCT: 6 %
Lymphs Abs: 0.8 10*3/uL (ref 0.7–4.0)
Lymphs Abs: 0.3 10*3/uL — ABNORMAL LOW (ref 0.7–4.0)
MCH: 29.1 pg (ref 26.0–34.0)
MCH: 28.4 pg (ref 26.0–34.0)
MCHC: 30.6 g/dL (ref 30.0–36.0)
MCHC: 29.8 g/dL — ABNORMAL LOW (ref 30.0–36.0)
MCV: 94.8 fL (ref 78.0–100.0)
MCV: 95 fL (ref 78.0–100.0)
Monocytes Absolute: 0.8 10*3/uL (ref 0.1–1.0)
Monocytes Absolute: 0.2 10*3/uL (ref 0.1–1.0)
Monocytes Relative: 5 %
Monocytes Relative: 5 %
NEUTROS ABS: 14.9 10*3/uL — AB (ref 1.7–7.7)
NEUTROS ABS: 4.3 10*3/uL (ref 1.7–7.7)
NEUTROS PCT: 90 %
Neutrophils Relative %: 88 %
Platelets: 278 10*3/uL (ref 150–400)
Platelets: 212 10*3/uL (ref 150–400)
RBC: 4.06 MIL/uL — ABNORMAL LOW (ref 4.22–5.81)
RBC: 3.42 MIL/uL — AB (ref 4.22–5.81)
RDW: 16.9 % — ABNORMAL HIGH (ref 11.5–15.5)
RDW: 16.8 % — ABNORMAL HIGH (ref 11.5–15.5)
WBC: 16.5 10*3/uL — ABNORMAL HIGH (ref 4.0–10.5)
WBC: 4.9 10*3/uL (ref 4.0–10.5)

## 2017-08-11 LAB — URINALYSIS, ROUTINE W REFLEX MICROSCOPIC
BILIRUBIN URINE: NEGATIVE
Glucose, UA: NEGATIVE mg/dL
HGB URINE DIPSTICK: NEGATIVE
KETONES UR: NEGATIVE mg/dL
Leukocytes, UA: NEGATIVE
Nitrite: NEGATIVE
PROTEIN: NEGATIVE mg/dL
Specific Gravity, Urine: 1.02 (ref 1.005–1.030)
pH: 5 (ref 5.0–8.0)

## 2017-08-11 LAB — INFLUENZA PANEL BY PCR (TYPE A & B)
INFLAPCR: NEGATIVE
Influenza B By PCR: NEGATIVE

## 2017-08-11 LAB — LITHIUM LEVEL: LITHIUM LVL: 0.61 mmol/L (ref 0.60–1.20)

## 2017-08-11 LAB — C-REACTIVE PROTEIN: CRP: 26.2 mg/dL — AB (ref <1.0)

## 2017-08-11 LAB — SEDIMENTATION RATE: SED RATE: 55 mm/h — AB (ref 0–16)

## 2017-08-11 LAB — PROTIME-INR
INR: 1.52
Prothrombin Time: 18.2 seconds — ABNORMAL HIGH (ref 11.4–15.2)

## 2017-08-11 LAB — CREATININE, SERUM
CREATININE: 0.98 mg/dL (ref 0.61–1.24)
GFR calc non Af Amer: >60 mL/min (ref >60)

## 2017-08-11 LAB — LACTIC ACID, PLASMA: Lactic Acid, Venous: 3 mmol/L (ref 0.5–1.9)

## 2017-08-11 LAB — VALPROIC ACID LEVEL: VALPROIC ACID LVL: 16 ug/mL — AB (ref 50.0–100.0)

## 2017-08-11 LAB — PROCALCITONIN: PROCALCITONIN: 0.64 ng/mL

## 2017-08-11 MED ORDER — GLYCOPYRROLATE 0.2 MG/ML IJ SOLN
0.2000 mg | INTRAMUSCULAR | Status: DC | PRN
  Administered 2017-08-11 – 2017-08-12 (×3): 0.2 mg via INTRAVENOUS
  Filled 2017-08-11 (×3): qty 1

## 2017-08-11 MED ORDER — GLYCOPYRROLATE 1 MG PO TABS: 1.0000 mg | ORAL_TABLET | ORAL | Status: DC | PRN

## 2017-08-11 MED ORDER — POLYVINYL ALCOHOL 1.4 % OP SOLN: 1.0000 [drp] | Freq: Four times a day (QID) | OPHTHALMIC | Status: DC | PRN

## 2017-08-11 MED ORDER — VANCOMYCIN HCL IN DEXTROSE 1-5 GM/200ML-% IV SOLN
1000.0000 mg | Freq: Once | INTRAVENOUS | Status: AC
  Administered 2017-08-11: 1000 mg via INTRAVENOUS
  Filled 2017-08-11: qty 200

## 2017-08-11 MED ORDER — HALOPERIDOL LACTATE 5 MG/ML IJ SOLN: 0.5000 mg | INTRAMUSCULAR | Status: DC | PRN

## 2017-08-11 MED ORDER — DEXTROSE 5 % IV SOLN: 2.0000 g | INTRAVENOUS | Status: DC

## 2017-08-11 MED ORDER — ENOXAPARIN SODIUM 40 MG/0.4ML ~~LOC~~ SOLN: 40.0000 mg | SUBCUTANEOUS | Status: DC

## 2017-08-11 MED ORDER — OXYCODONE HCL 5 MG PO TABS: 5.0000 mg | ORAL_TABLET | ORAL | Status: DC | PRN

## 2017-08-11 MED ORDER — SODIUM CHLORIDE 0.9% FLUSH
3.0000 mL | Freq: Two times a day (BID) | INTRAVENOUS | Status: DC
  Administered 2017-08-11 – 2017-08-13 (×4): 3 mL via INTRAVENOUS

## 2017-08-11 MED ORDER — SODIUM CHLORIDE 0.9 % IV BOLUS (SEPSIS)
2000.0000 mL | Freq: Once | INTRAVENOUS | Status: AC
  Administered 2017-08-11: 2000 mL via INTRAVENOUS

## 2017-08-11 MED ORDER — MORPHINE SULFATE (PF) 4 MG/ML IV SOLN
1.0000 mg | INTRAVENOUS | Status: DC | PRN
  Administered 2017-08-11 – 2017-08-13 (×7): 1 mg via INTRAVENOUS
  Filled 2017-08-11 (×7): qty 1

## 2017-08-11 MED ORDER — DEXTROSE 5 % IV SOLN
2.0000 g | Freq: Once | INTRAVENOUS | Status: AC
  Administered 2017-08-11: 2 g via INTRAVENOUS
  Filled 2017-08-11: qty 2

## 2017-08-11 MED ORDER — SODIUM CHLORIDE 0.9 % IV BOLUS (SEPSIS)
500.0000 mL | Freq: Once | INTRAVENOUS | Status: AC
  Administered 2017-08-11: 500 mL via INTRAVENOUS

## 2017-08-11 MED ORDER — SODIUM CHLORIDE 0.9 % IV SOLN
INTRAVENOUS | Status: DC
  Administered 2017-08-11: 75 mL/h via INTRAVENOUS

## 2017-08-11 MED ORDER — VANCOMYCIN HCL 10 G IV SOLR: 1250.0000 mg | INTRAVENOUS | Status: DC

## 2017-08-11 MED ORDER — HYDROCODONE-ACETAMINOPHEN 5-325 MG PO TABS: 1.0000 | ORAL_TABLET | ORAL | Status: DC | PRN

## 2017-08-11 MED ORDER — ACETAMINOPHEN 650 MG RE SUPP
650.0000 mg | Freq: Once | RECTAL | Status: AC
  Administered 2017-08-11: 650 mg via RECTAL

## 2017-08-11 MED ORDER — ONDANSETRON HCL 4 MG/2ML IJ SOLN: 4.0000 mg | Freq: Four times a day (QID) | INTRAMUSCULAR | Status: DC | PRN

## 2017-08-11 MED ORDER — ACETAMINOPHEN 650 MG RE SUPP
650.0000 mg | Freq: Four times a day (QID) | RECTAL | Status: DC | PRN
  Filled 2017-08-11: qty 1

## 2017-08-11 MED ORDER — ONDANSETRON 4 MG PO TBDP: 4.0000 mg | ORAL_TABLET | Freq: Four times a day (QID) | ORAL | Status: DC | PRN

## 2017-08-11 MED ORDER — BIOTENE DRY MOUTH MT LIQD: 15.0000 mL | OROMUCOSAL | Status: DC | PRN

## 2017-08-11 MED ORDER — ACETAMINOPHEN 650 MG RE SUPP: 650.0000 mg | Freq: Four times a day (QID) | RECTAL | Status: DC | PRN

## 2017-08-11 MED ORDER — HALOPERIDOL 1 MG PO TABS: 0.5000 mg | ORAL_TABLET | ORAL | Status: DC | PRN

## 2017-08-11 MED ORDER — GLYCOPYRROLATE 0.2 MG/ML IJ SOLN: 0.2000 mg | INTRAMUSCULAR | Status: DC | PRN

## 2017-08-11 MED ORDER — SODIUM CHLORIDE 0.9% FLUSH: 3.0000 mL | INTRAVENOUS | Status: DC | PRN

## 2017-08-11 MED ORDER — HALOPERIDOL LACTATE 2 MG/ML PO CONC: 0.5000 mg | ORAL | Status: DC | PRN

## 2017-08-11 MED ORDER — ZOLPIDEM TARTRATE 5 MG PO TABS: 5.0000 mg | ORAL_TABLET | Freq: Every evening | ORAL | Status: DC | PRN

## 2017-08-11 MED ORDER — ACETAMINOPHEN 325 MG PO TABS: 650.0000 mg | ORAL_TABLET | Freq: Four times a day (QID) | ORAL | Status: DC | PRN

## 2017-08-11 MED ORDER — IOPAMIDOL (ISOVUE-370) INJECTION 76%
INTRAVENOUS | Status: AC
  Administered 2017-08-11: 100 mL
  Filled 2017-08-11: qty 100

## 2017-08-11 MED ORDER — DIPHENHYDRAMINE HCL 50 MG/ML IJ SOLN: 12.5000 mg | INTRAMUSCULAR | Status: DC | PRN

## 2017-08-11 MED ORDER — ONDANSETRON HCL 4 MG PO TABS: 4.0000 mg | ORAL_TABLET | Freq: Four times a day (QID) | ORAL | Status: DC | PRN

## 2017-08-11 MED ORDER — SODIUM CHLORIDE 0.9 % IV SOLN: 250.0000 mL | INTRAVENOUS | Status: DC | PRN

## 2017-08-11 NOTE — Progress Notes (Signed)
Pt transported on Bipap to CT and returned back to ED trauma B. Pt vitals remain stable throughout.

## 2017-08-11 NOTE — ED Notes (Signed)
REPORT GIVEN TO BEN.

## 2017-08-11 NOTE — ED Notes (Signed)
Patient in bed; fixed gaze; eyes open but tracking to voice. Patient attempts to communicate when asked if in pain stated "no". Respirations even and unlabored but patient does appear to be actively dying. Fingers and toes cool. Some moist breathing at times. Family educated about death rattle and fact that this is benign   part of dying process. Lips swabbed and patient closed mouth when attempting to swab mouth. End of life education provided. Family accepting appropriate. No further needs at this time- patient appears comfortable. RN will continue to monitor.

## 2017-08-11 NOTE — ED Notes (Signed)
Admitting PA at bedside to discuss palliative care with family.

## 2017-08-11 NOTE — H&P (Signed)
History and Physical    Louis Patel CWC:[id number] DOB: 1925/12/03 DOA: 08/11/2017  PCP: Orvis Brill, [facility]   Patient coming from: SNF   Chief Complaint: SOB and fever  HPI: Louis Patel is a 84 y.o. male with medical history significant of dementia, HTN, Hyperlipidemia, large sacral decubitus who presented to the ED with c/o SOB and confusion.  According to EMS report, patient was having oxygen saturation at 80% on room air when they arrived to the nursing home.  ED Course: Upon arrival to the ED patient was found to be febrile with temperature of 101.5F, tachycardic and tachypneic. His Blood work demonstrated WBC 16.5 with 90% neutrophils, Lactic acid elevated to 2.61  Patient was started on IV hydration and IV antibiotics - Vanc and Cephepime Flu test was negative, chest x-ray demonstrated bibasilar interstitial prominence but no obvious infiltrates or effusions were noted. UA was negative for infection Abdominal CT T was ordered to rule out any infection or osteomyelitis given large sacral decubitus and CTPA were ordered to rule out occult pneumonia and lower pulmonary embolism as a source of hypoxia  Review of Systems: As per HPI, patient is unable to provide meaningful review of systems due to lethargy  Ambulatory Status: Unknown Past Medical History:  Diagnosis Date  . Colon polyps   . Dementia   . Diverticulosis   . Enlarged prostate   . Hyperlipidemia   . Hypertension   . Hypothyroidism     Past Surgical History:  Procedure Laterality Date  . CATARACT EXTRACTION Left 07/18/08   left cataract extraction  . CATARACT EXTRACTION Right   . COLONOSCOPY    . PROSTATE SURGERY  3/08   Prostatectomy  . TONSILLECTOMY      Social History   Socioeconomic History  . Marital status: Widowed    Spouse name: Not on file  . Number of children: 3  . Years of education: Not on file  . Highest education level: Not on file  Social Needs  . Financial  resource strain: Not on file  . Food insecurity - worry: Not on file  . Food insecurity - inability: Not on file  . Transportation needs - medical: Not on file  . Transportation needs - non-medical: Not on file  Occupational History  . Occupation: retired  Tobacco Use  . Smoking status: Former Smoker    Packs/day: 1.00    Years: 20.00    Pack years: 20.00    Types: Cigarettes    Last attempt to quit: 08/24/1962    Years since quitting: 55.0  . Smokeless tobacco: Never Used  Substance and Sexual Activity  . Alcohol use: No    Alcohol/week: 0.0 oz  . Drug use: No  . Sexual activity: Not Currently    Partners: Female  Other Topics Concern  . Not on file  Social History Narrative  . Not on file    No Known Allergies  Family History  Problem Relation Age of Onset  . Stroke Mother   . Hypertension Mother   . Esophageal cancer Brother   . Heart disease Unknown   . Stroke Sister   . Dementia Brother   . Dementia Brother   . Dementia Sister     Prior to Admission medications   Medication Sig Start Date End Date Taking? Authorizing Provider  acetaminophen (TYLENOL) 325 MG tablet Take 2 tablets (650 mg total) every 4 (four) hours as needed by mouth for mild pain (or Fever >/= 101).  05/12/17  Yes Rizwan, Saima, MD  Amino Acids-Protein Hydrolys (FEEDING SUPPLEMENT, PRO-STAT SUGAR FREE 64,) LIQD Take 30 mLs by mouth 2 (two) times daily.   Yes [provider]  divalproex (DEPAKOTE ER) 250 MG 24 hr tablet Take 250 mg by mouth every 8 (eight) hours. Take with depakote er 500 mg to equal 750 mg    Yes [provider]  docusate sodium (COLACE) 100 MG capsule Take 100 mg by mouth daily.   Yes [provider]  donepezil (ARICEPT) 5 MG tablet Take 5 mg by mouth at bedtime.    Yes [provider]  finasteride (PROSCAR) 5 MG tablet Take 1 tablet (5 mg total) by mouth as directed. Patient taking differently: Take 5 mg by mouth daily.  07/13/15  Yes Lowne  Chase, Yvonne R, DO  levothyroxine (SYNTHROID, LEVOTHROID) 50 MCG tablet Take 1 tablet (50 mcg total) by mouth daily. 04/07/17  Yes Lowne Chase, Yvonne R, DO  lithium carbonate 300 MG capsule Take 300 mg by mouth at bedtime.   Yes [provider]  memantine (NAMENDA) 10 MG tablet Take 5 mg by mouth at bedtime.    Yes [provider]  Multiple Vitamins-Minerals (DECUBI-VITE) CAPS Take 1 capsule by mouth daily.   Yes [provider]  Nutritional Supplements (ARGINAID) PACK Take 1 packet by mouth daily.   Yes [provider]  risperiDONE (RISPERDAL) 0.5 MG tablet Take 0.25 mg by mouth daily.    Yes [provider]  vitamin C (ASCORBIC ACID) 250 MG tablet Take 500 mg by mouth 2 (two) times daily.   Yes [provider]  zinc sulfate 220 (50 Zn) MG capsule Take 220 mg by mouth daily.   Yes [provider]    Physical Exam: Vitals:   08/11/17 0615 08/11/17 0630 08/11/17 0700 08/11/17 0715  BP: 110/76 97/64 125/74 110/74  Pulse: (!) 114 (!) 108 (!) 115 (!) 115  Resp: (!) 27 (!) 26 (!) 26 16  Temp:      TempSrc:      SpO2: 95% 96% 95% 91%     General:Appears calm and comfortable, lethargic Eyes:  PERRL, EOMI, normal lids, iris, no discharge ENT:  grossly normal hearing, lips & tongue, mmm Neck: no LAD, masses or thyromegaly; no carotid bruits Cardiovascular: RRR, no m/r/g. No LE edema.  Respiratory:  Coarse BS bilaterally with no wheezes, scattered few rhonchi. Shallow respirations Abdomen:  soft, NT, ND, NABS Skin: large sacral decubitus, stage IV, right heel covered with eschar, Musculoskeletal: limited ROM, no bony abnormality Psychiatric: normal mood and affect, speech fluent and appropriate, AOx3 Neurologic: CN 2-12 grossly intact, moves all extremities in coordinated fashion, sensation intact    Radiological Exams on Admission: Dg Chest Port 1 View  Result Date: 08/11/2017 CLINICAL DATA:  Altered mental status, hypoxia.  EXAM: PORTABLE CHEST 1 VIEW COMPARISON:  Chest radiograph May 08, 2017 FINDINGS: Cardiac silhouette is normal,, calcified aortic knob. Coarsened bibasilar pulmonary interstitium. No pleural effusion. No pneumothorax. Soft tissue planes and included osseous structures are non suspicious. IMPRESSION: Bibasilar interstitial prominence seen with atypical infection, atelectasis or fibrosis. Aortic Atherosclerosis (ICD10-I70.0). Electronically Signed   By: Courtnay  Bloomer M.D.   On: 08/11/2017 04:50    EKG: Independently reviewed. Sinus tachy with nonspecific ST changes with no evidence of acute ischemia   Labs on Admission: I have personally reviewed the available labs and imaging studies at the time of the admission.      Assessment/Plan Principal Problem:     Severe sepsis (HCC) Active Problems:   Depression   Hypothyroidism   Mild dementia   Pressure injury of skin   Severe sepsis with hypoxia on presentation - continue BiPAPP per respiratory .  Elevated WBC count, fever, tachycardia with elevated lactate Blood cultures, CRP and ESR are ordered Sepsis protocol initiated, received IVF bolus and started on abx and feeling  Source of sepsis is unclear, UA is normal, chest x-ray without obvious abnormality.  CT PA and abdominal and pelvic CT are pending  Continued to  trend lactate to ensure improvement  Sacral decubitus  - will order skin care consult to assist with dressing changes   Hypothyroidism - most recent TSH 2.248 in November 2018 Continue Synthroid and if needed, adjust the dose    Dementia with mood disorder  - continue Aricept and Namenda,  Risperdal and Valproic acid when patient is able to have oral intake, provide supportive care   DVT prophylaxis: Lovenox or SCDs Code Status: DNR Family Communication: none Disposition Plan: SNF when stable Consults called: none Admission status: inpatient   York Grice PA-C [phone] [facility]  If note  is complete, please contact covering daytime or nighttime physician. [email or web address] Password [email or web address]  08/11/2017, 9:08 AM

## 2017-08-11 NOTE — ED Provider Notes (Signed)
[facility] EMERGENCY DEPARTMENT Provider Note   CSN: [id number] Arrival date & time: 08/11/17  0417     History   Chief Complaint Chief Complaint  Patient presents with  . Code Sepsis    HPI Louis Patel is a 84 y.o. male.  HPI 84 year old male with history of dementia, enlarged prostate, hypertension who comes in with chief complaint of shortness of breath and fevers.  Level 5 caveat for dementia and respiratory distress.  According to nursing home patient was doing well yesterday, however in the middle the night he became short of breath and was noted to be febrile.  Per EMS patient was satting at 80% when they arrived on room air.  Patient is not providing any meaningful history at this time.  Patient denies any pain anywhere. Patient was admitted to the hospital a few months ago and was noted to have parainfluenza infection.  Patient is DNR, DNI  Past Medical History:  Diagnosis Date  . Colon polyps   . Dementia   . Diverticulosis   . Enlarged prostate   . Hyperlipidemia   . Hypertension   . Hypothyroidism     Patient Active Problem List   Diagnosis Date Noted  . Hypokalemia 05/12/2017  . Parainfluenza virus infection 05/11/2017  . Acute respiratory failure ([place]) 05/11/2017  . AKI (acute kidney injury) ([place]) 05/09/2017  . Hyponatremia 05/09/2017  . Aggressive behavior of adult   . Mild dementia 06/06/2016  . Hypothyroidism 01/25/2016  . UTI (urinary tract infection) 06/19/2015  . Obesity (BMI 30-39.9) 02/22/2013  . Memory loss 01/22/2011  . MOLE 05/22/2010  . DEPRESSIVE DISORDER 05/22/2010  . ALLERGIC RHINITIS CAUSE UNSPECIFIED 10/17/2008  . PSA, INCREASED 05/23/2008  . Hyperlipidemia 02/26/2007  . HEMORRHOIDS, EXTERNAL 02/26/2007    Past Surgical History:  Procedure Laterality Date  . CATARACT EXTRACTION Left 07/18/08   left cataract extraction  . CATARACT EXTRACTION Right   . COLONOSCOPY    . PROSTATE SURGERY  3/08   Prostatectomy  . TONSILLECTOMY         Home Medications    Prior to Admission medications   Medication Sig Start Date End Date Taking? Authorizing Provider  acetaminophen (TYLENOL) 325 MG tablet Take 2 tablets (650 mg total) every 4 (four) hours as needed by mouth for mild pain (or Fever >/= 101). 05/12/17  Yes Debbe Odea, MD  Amino Acids-Protein Hydrolys (FEEDING SUPPLEMENT, PRO-STAT SUGAR FREE 64,) LIQD Take 30 mLs by mouth 2 (two) times daily.   Yes [provider]  divalproex (DEPAKOTE ER) 250 MG 24 hr tablet Take 250 mg by mouth every 8 (eight) hours. Take with depakote er 500 mg to equal 750 mg    Yes [provider]  docusate sodium (COLACE) 100 MG capsule Take 100 mg by mouth daily.   Yes [provider]  donepezil (ARICEPT) 5 MG tablet Take 5 mg by mouth at bedtime.    Yes [provider]  finasteride (PROSCAR) 5 MG tablet Take 1 tablet (5 mg total) by mouth as directed. Patient taking differently: Take 5 mg by mouth daily.  07/13/15  Yes Ann Held, DO  levothyroxine (SYNTHROID, LEVOTHROID) 50 MCG tablet Take 1 tablet (50 mcg total) by mouth daily. 04/07/17  Yes Roma Schanz R, DO  lithium carbonate 300 MG capsule Take 300 mg by mouth at bedtime.   Yes [provider]  memantine (NAMENDA) 10 MG tablet Take 5 mg by mouth at bedtime.  Yes [provider]  Multiple Vitamins-Minerals (DECUBI-VITE) CAPS Take 1 capsule by mouth daily.   Yes [provider]  Nutritional Supplements (ARGINAID) PACK Take 1 packet by mouth daily.   Yes [provider]  risperiDONE (RISPERDAL) 0.5 MG tablet Take 0.25 mg by mouth daily.    Yes [provider]  vitamin C (ASCORBIC ACID) 250 MG tablet Take 500 mg by mouth 2 (two) times daily.   Yes [provider]  zinc sulfate 220 (50 Zn) MG capsule Take 220 mg by mouth daily.   Yes [provider]    Family History Family History  Problem  Relation Age of Onset  . Stroke Mother   . Hypertension Mother   . Esophageal cancer Brother   . Heart disease Unknown   . Stroke Sister   . Dementia Brother   . Dementia Brother   . Dementia Sister     Social History Social History   Tobacco Use  . Smoking status: Former Smoker    Packs/day: 1.00    Years: 20.00    Pack years: 20.00    Types: Cigarettes    Last attempt to quit: 08/24/1962    Years since quitting: 55.0  . Smokeless tobacco: Never Used  Substance Use Topics  . Alcohol use: No    Alcohol/week: 0.0 oz  . Drug use: No     Allergies   Patient has no known allergies.   Review of Systems Review of Systems  Unable to perform ROS: Dementia     Physical Exam Updated Vital Signs BP 97/64   Pulse (!) 108   Temp 98.1 F (36.7 C)   Resp (!) 26   SpO2 96%   Physical Exam  Constitutional: He is oriented to person, place, and time. He appears well-developed.  HENT:  Head: Atraumatic.  Eyes: EOM are normal. Pupils are equal, round, and reactive to light.  Neck: Neck supple.  Cardiovascular:  Tachycardia  Pulmonary/Chest: He is in respiratory distress. He has no wheezes.  Abdominal: Soft. He exhibits distension. There is no tenderness.  Musculoskeletal: He exhibits no edema.  Stage IV pressure ulcer over the sacrum. No purulence appreciated  Neurological: He is alert and oriented to person, place, and time.  Skin: Skin is warm.  Stage I pressure ulcer to the lower extremities  Nursing note and vitals reviewed.    ED Treatments / Results  Labs (all labs ordered are listed, but only abnormal results are displayed) Labs Reviewed  COMPREHENSIVE METABOLIC PANEL - Abnormal; Notable for the following components:      Result Value   Sodium 151 (*)    Chloride 116 (*)    Glucose, Bld 133 (*)    BUN 39 (*)    Creatinine, Ser 1.26 (*)    Calcium 7.9 (*)    Total Protein 5.5 (*)    Albumin 1.9 (*)    Total Bilirubin 1.3 (*)    GFR calc non Af Amer  49 (*)    GFR calc Af Amer 57 (*)    All other components within normal limits  CBC WITH DIFFERENTIAL/PLATELET - Abnormal; Notable for the following components:   WBC 16.5 (*)    RBC 4.06 (*)    Hemoglobin 11.8 (*)    HCT 38.5 (*)    RDW 16.9 (*)    Neutro Abs 14.9 (*)    All other components within normal limits  PROTIME-INR - Abnormal; Notable for the following components:   Prothrombin  Time 18.2 (*)    All other components within normal limits  URINALYSIS, ROUTINE W REFLEX MICROSCOPIC - Abnormal; Notable for the following components:   Color, Urine AMBER (*)    All other components within normal limits  VALPROIC ACID LEVEL - Abnormal; Notable for the following components:   Valproic Acid Lvl 16 (*)    All other components within normal limits  I-STAT CG4 LACTIC ACID, ED - Abnormal; Notable for the following components:   Lactic Acid, Venous 2.13 (*)    All other components within normal limits  I-STAT CG4 LACTIC ACID, ED - Abnormal; Notable for the following components:   Lactic Acid, Venous 2.61 (*)    All other components within normal limits  I-STAT ARTERIAL BLOOD GAS, ED - Abnormal; Notable for the following components:   pH, Arterial 7.452 (*)    pO2, Arterial 61.0 (*)    Acid-Base Excess 4.0 (*)    All other components within normal limits  CULTURE, BLOOD (ROUTINE X 2)  CULTURE, BLOOD (ROUTINE X 2)  URINE CULTURE  INFLUENZA PANEL BY PCR (TYPE A & B)  LITHIUM LEVEL  BLOOD GAS, ARTERIAL  I-STAT CG4 LACTIC ACID, ED  I-STAT CG4 LACTIC ACID, ED    EKG  EKG Interpretation  Date/Time:  Monday August 11 2017 04:25:37 EST Ventricular Rate:  134 PR Interval:    QRS Duration: 84 QT Interval:  345 QTC Calculation: 518 R Axis:   -52 Text Interpretation:  Sinus tachycardia Left anterior fascicular block Abnormal R-wave progression, late transition Repolarization abnormality, prob rate related Prolonged QT interval Nonspecific ST and T wave abnormality No significant  change since last tracing Confirmed by Varney Biles ([id number]) on 08/11/2017 5:02:19 AM       Radiology Dg Chest Port 1 View  Result Date: 08/11/2017 CLINICAL DATA:  Altered mental status, hypoxia. EXAM: PORTABLE CHEST 1 VIEW COMPARISON:  Chest radiograph May 08, 2017 FINDINGS: Cardiac silhouette is normal,, calcified aortic knob. Coarsened bibasilar pulmonary interstitium. No pleural effusion. No pneumothorax. Soft tissue planes and included osseous structures are non suspicious. IMPRESSION: Bibasilar interstitial prominence seen with atypical infection, atelectasis or fibrosis. Aortic Atherosclerosis (ICD10-I70.0). Electronically Signed   By: Elon Alas M.D.   On: 08/11/2017 04:50    Procedures Procedures (including critical care time)  CRITICAL CARE Performed by: Tanyon Alipio   Total critical care time: 48 minutes  Critical care time was exclusive of separately billable procedures and treating other patients.  Critical care was necessary to treat or prevent imminent or life-threatening deterioration.  Critical care was time spent personally by me on the following activities: development of treatment plan with patient and/or surrogate as well as nursing, discussions with consultants, evaluation of patient's response to treatment, examination of patient, obtaining history from patient or surrogate, ordering and performing treatments and interventions, ordering and review of laboratory studies, ordering and review of radiographic studies, pulse oximetry and re-evaluation of patient's condition.   Medications Ordered in ED Medications  vancomycin (VANCOCIN) 1,250 mg in sodium chloride 0.9 % 250 mL IVPB (not administered)  ceFEPIme (MAXIPIME) 2 g in dextrose 5 % 50 mL IVPB (not administered)  sodium chloride 0.9 % bolus 2,000 mL (0 mLs Intravenous Stopped 08/11/17 0548)  ceFEPIme (MAXIPIME) 2 g in dextrose 5 % 50 mL IVPB (0 g Intravenous Stopped 08/11/17 0516)  vancomycin  (VANCOCIN) IVPB 1000 mg/200 mL premix (0 mg Intravenous Stopped 08/11/17 0645)  acetaminophen (TYLENOL) suppository 650 mg (650 mg Rectal Given 08/11/17 0446)  Initial Impression / Assessment and Plan / ED Course  I have reviewed the triage vital signs and the nursing notes.  Pertinent labs & imaging results that were available during my care of the patient were reviewed by me and considered in my medical decision making (see chart for details).  Clinical Course as of Aug 11 656  [facility] Aug 11, 2017  0656 Patient status post 2 L of IV fluid.  His blood pressure is stable heart rate has improved. Son at bedside, and provides ancillary history.  Allegedly patient is now bedbound.  Chest x-ray does not show any pneumonia.  If the flu test is negative, it might be worthwhile to get a CT angios chest. Lactic Acid, Venous: (!!) 2.61 [AN]  [id number] Urine does not show any evidence of infection. Color, Urine: (!) AMBER [AN]  [id number] X-ray of the sacral wound ordered. We will admit to medicine. Pt on bipap and looks a lot more comfortable.  [AN]    Clinical Course User Index [AN] Varney Biles, MD    84 year old male with history of dementia and metabolic disorders comes in with chief complaint of fever.  Allegedly patient was doing well yesterday, but started having acute respiratory distress overnight and was noted to have fevers.  Patient not providing any meaningful history.  Patient arrives with tachycardia, tachypnea, and distended abdomen with 1.4 L of urinary retention.  Patient has large pressure ulcer over the sacrum.  DDx: Sepsis syndrome - soft tissue, uti, pnuemonia infleunza ACS syndrome CHF exacerbation PE Dehydration Electrolyte abnormality  Sepsis protocol initiated.  Patient is noted to be hypoxic, therefore respiratory source is highest in the differential diagnosis.      Final Clinical Impressions(s) / ED Diagnoses   Final diagnoses:  Acute respiratory failure with hypoxia  ([place])  Severe sepsis [facility])    ED Discharge Orders    None       Varney Biles, MD 08/11/17 [phone]

## 2017-08-11 NOTE — ED Notes (Signed)
Priest at bedside performed last rites.

## 2017-08-11 NOTE — ED Notes (Signed)
RT at bed d/t intermittent desats, family at bedside

## 2017-08-11 NOTE — ED Notes (Signed)
Attempted IV access x 2 unsuccessful, IV team consult placed with order to collect labs with IV start

## 2017-08-11 NOTE — ED Notes (Signed)
Unable to place foley cath, cath will not pass, pt has small amount of blood present on foley cath upon removing, will have a second nurse attempt, pt placed on hospital bed with air mattress

## 2017-08-11 NOTE — Consult Note (Addendum)
WOC consult requested for sacrum wound.  Pt's family has just requested comfort care goals and his blood pressure is very low and breathing is labored, according to the EMR.  He has a chronic stage 4 pressure injury, as noted in the nursing wound flow sheet.  Moist gauze packing can be applied daily by staff nurses to absorb drainage and minimize discomfort. Please re-consult if further assistance is needed.  Thank-you,  Julien Girt MSN, [place], [place], [place], [place]

## 2017-08-11 NOTE — Progress Notes (Signed)
Patient's family requested priest from [facility] to come and anoint the patient.  I called and spoke with Catalina Lunger who was kind to share she would call some other parishes to find someone. She arranged for Father Mindi Slicker from [place] to come.  I went back to share this info with the family. I asked if I could be of service however they wanted only a [place], [place]ney [place]ous[place]   08/11/17 1400  Clinical Encounter Type  Visited With Patient and family together  Visit Type ED  Referral From Nurse  Consult/Referral To Chaplain  Recommendations (catholic priest)  Spiritual Encounters  Spiritual Needs Ritual

## 2017-08-11 NOTE — Progress Notes (Signed)
   08/11/17 0929  Clinical Encounter Type  Visited With Patient and family together  Visit Type ED  Spiritual Encounters  Spiritual Needs Prayer   Patient is on Bipap.  Son and daughter present.  Offered hospitality.  Patient is catholic and I will let his Lowella Fairy know he is here.  Family did not need any other assistance at this time. Will follow as needed. Chaplain Katherene Ponto

## 2017-08-11 NOTE — ED Notes (Signed)
Dr Kathrynn Humble given a copy of lactic acid 2.13

## 2017-08-11 NOTE — ED Triage Notes (Signed)
Per EMS pt from [facility] home. Per Staff completely alert and at 0300 was found altered, low bp, and sats in the 80s. Placed on 2L [place].  EMS started NRB and increased sats to 100%.  Fever 102.6, HR 150.  BP 88/60.

## 2017-08-11 NOTE — Progress Notes (Signed)
Pharmacy Antibiotic Note  MILON DETHLOFF Sr is a 84 y.o. male admitted on 08/11/2017 with pneumonia.  Pharmacy has been consulted for Vancomycin/Cefepime dosing. WBC elevated. Mild bump in Scr. CXR with possible atypical infection.   Plan: Vancomycin 1250 mg IV q24h Cefepime 2g IV q24h Trend WBC, temp, renal function  F/U infectious work-up Drug levels as indicated   Temp (24hrs), Avg:101.5 F (38.6 C), Min:101.5 F (38.6 C), Max:101.5 F (38.6 C)  Recent Labs  Lab 08/11/17 0430 08/11/17 0452  WBC 16.5*  --   CREATININE 1.26*  --   LATICACIDVEN  --  2.13*    CrCl cannot be calculated (Unknown ideal weight.).    No Known Allergies  [name] 08/11/2017 5:49 AM

## 2017-08-11 NOTE — ED Notes (Signed)
Son at bedside, provided updates. MD to come to speak to pt

## 2017-08-11 NOTE — ED Notes (Signed)
RT to bedside to remove Bipap per family wishes. Patient made comfort care. Family at bedside

## 2017-08-11 NOTE — ED Notes (Addendum)
CRITICAL VALUE ALERT  Critical Value:  Lactic Acid 3.0  Date & Time Notied:  08/11/17 12:22  Provider Notified: Dr. Rockne Menghini

## 2017-08-11 NOTE — ED Notes (Signed)
Dr Kathrynn Humble given a copy of lactic acid 2.61

## 2017-08-11 NOTE — ED Notes (Signed)
PA aware of declining BP, verbal order to start 500 mL bolus

## 2017-08-11 NOTE — Progress Notes (Signed)
Patient condition declined. He became non-responsive to fluid resuscitation and did not respond to verbal stimuli. His chest CT showed presence of PE,abdolmnal CT revealed bilateral DVT in the femoral arteries and extension of the sacral decubitus to the sacrum, but no signs of osteomyelitis I spoke with the family at bedside and they did not want IV fluids, BiPAP or anticoagulation therapy. Admission to stepdown was changed to med-surg floor, IVF and BiPAP were discontinued.  Patient was placed on End of Life order set.

## 2017-08-12 DIAGNOSIS — A419 Sepsis, unspecified organism: Secondary | ICD-10-CM

## 2017-08-12 DIAGNOSIS — R6520 Severe sepsis without septic shock: Secondary | ICD-10-CM

## 2017-08-12 LAB — BLOOD CULTURE ID PANEL (REFLEXED)
ACINETOBACTER BAUMANNII: NOT DETECTED
CANDIDA ALBICANS: NOT DETECTED
CANDIDA TROPICALIS: NOT DETECTED
Candida glabrata: NOT DETECTED
Candida krusei: NOT DETECTED
Candida parapsilosis: NOT DETECTED
ENTEROBACTERIACEAE SPECIES: NOT DETECTED
Enterobacter cloacae complex: NOT DETECTED
Enterococcus species: NOT DETECTED
Escherichia coli: NOT DETECTED
Haemophilus influenzae: NOT DETECTED
KLEBSIELLA PNEUMONIAE: NOT DETECTED
Klebsiella oxytoca: NOT DETECTED
Listeria monocytogenes: NOT DETECTED
METHICILLIN RESISTANCE: DETECTED — AB
NEISSERIA MENINGITIDIS: NOT DETECTED
Proteus species: NOT DETECTED
Pseudomonas aeruginosa: NOT DETECTED
SERRATIA MARCESCENS: NOT DETECTED
STAPHYLOCOCCUS AUREUS BCID: NOT DETECTED
STREPTOCOCCUS PYOGENES: NOT DETECTED
STREPTOCOCCUS SPECIES: NOT DETECTED
Staphylococcus species: DETECTED — AB
Streptococcus agalactiae: NOT DETECTED
Streptococcus pneumoniae: NOT DETECTED

## 2017-08-12 LAB — URINE CULTURE: Culture: NO GROWTH

## 2017-08-12 MED ORDER — LORAZEPAM 2 MG/ML IJ SOLN
1.0000 mg | INTRAMUSCULAR | Status: DC | PRN
  Administered 2017-08-13: 1 mg via INTRAVENOUS
  Filled 2017-08-12: qty 1

## 2017-08-12 MED ORDER — WHITE PETROLATUM EX OINT
TOPICAL_OINTMENT | CUTANEOUS | Status: AC
  Administered 2017-08-12: 13:00:00
  Filled 2017-08-12: qty 28.35

## 2017-08-12 NOTE — Progress Notes (Signed)
PHARMACY - PHYSICIAN COMMUNICATION CRITICAL VALUE ALERT - BLOOD CULTURE IDENTIFICATION (BCID)  Louis Patel is an 84 y.o. male who presented to [facility] on 08/11/2017 with a chief complaint of PNA  Name of physician (or Provider) Contacted: Tylene Fantasia ([facility])  Current antibiotics: None   Changes to prescribed antibiotics recommended:  None, pt on comfort care measures   Results for orders placed or performed during the hospital encounter of 08/11/17  Blood Culture ID Panel (Reflexed) (Collected: 08/11/2017  4:26 AM)  Result Value Ref Range   Enterococcus species NOT DETECTED NOT DETECTED   Listeria monocytogenes NOT DETECTED NOT DETECTED   Staphylococcus species DETECTED (A) NOT DETECTED   Staphylococcus aureus NOT DETECTED NOT DETECTED   Methicillin resistance DETECTED (A) NOT DETECTED   Streptococcus species NOT DETECTED NOT DETECTED   Streptococcus agalactiae NOT DETECTED NOT DETECTED   Streptococcus pneumoniae NOT DETECTED NOT DETECTED   Streptococcus pyogenes NOT DETECTED NOT DETECTED   Acinetobacter baumannii NOT DETECTED NOT DETECTED   Enterobacteriaceae species NOT DETECTED NOT DETECTED   Enterobacter cloacae complex NOT DETECTED NOT DETECTED   Escherichia coli NOT DETECTED NOT DETECTED   Klebsiella oxytoca NOT DETECTED NOT DETECTED   Klebsiella pneumoniae NOT DETECTED NOT DETECTED   Proteus species NOT DETECTED NOT DETECTED   Serratia marcescens NOT DETECTED NOT DETECTED   Haemophilus influenzae NOT DETECTED NOT DETECTED   Neisseria meningitidis NOT DETECTED NOT DETECTED   Pseudomonas aeruginosa NOT DETECTED NOT DETECTED   Candida albicans NOT DETECTED NOT DETECTED   Candida glabrata NOT DETECTED NOT DETECTED   Candida krusei NOT DETECTED NOT DETECTED   Candida parapsilosis NOT DETECTED NOT DETECTED   Candida tropicalis NOT DETECTED NOT DETECTED    [name] 08/12/2017  2:09 AM

## 2017-08-12 NOTE — Progress Notes (Signed)
Offered to turn pt through the course of the day but family thought that he was resting comfortably and any slight movements might cause discomfort. Pt left resting in bed laying on his right side, appears comfortable

## 2017-08-12 NOTE — Social Work (Addendum)
CSW acknowledging consult for residential hospice placement.   CSW to meet with pt family to give choice of hospice placement.   11:45am- CSW met with pt daughter and son in law at bedside. Pt family expresses that they would like for pt to be assessed by MD tomorrow morning and if they feel that the MD is comfortable with pt status at that point in time they would like [facility] in [place]. CSW contacted [facility] with referral. They will talk to pt family tomorrow if still appropriate for transfer.    CSW continuing to follow to support transfer.   Alexander Mt, [place] Work [phone]

## 2017-08-12 NOTE — Progress Notes (Signed)
PROGRESS NOTE    Louis Patel  HYQ:[id number] DOB: 1925/11/20 DOA: 08/11/2017 PCP: Orvis Brill, [facility]   Brief Narrative: Louis Patel is a 84 y.o. male with a Past Medical History of dementia, diverticulosis, enlarged prostate, hyperlipidemia, hypertension and hypothyroidism who presents with altered mental status associated with worsening shortness of breath from his SNF facility.  He is noted to have a large sacral decubitus and presented with sepsis of unclear etiology.  His flu test was negative.  Chest x-ray showed findings consistent with pneumonia, possible aspiration.   CT angio showed aspiration PNA, PE, and common femoral vein DVT> he was admitted with sepsis, PNA, aspiration, decubitus ulcer, MRSA Bacteremia on blood culture. Family discussed with admitting physician and decision was for comfort care.   I meet with patient son and daughter, both [place]. Explain to them difeent diagnosis. They comfirm goal of for comfort care, no IV antibioitcs, no IV fluids, no anticoagulation. Introduce idea of residenti  Assessment & Plan:   Principal Problem:   Severe sepsis ([place]) Active Problems:   Depression   Hypothyroidism   Mild dementia   Acute respiratory failure (HCC)   Pressure injury of skin   Pulmonary embolism (HCC)   DVT (deep venous thrombosis) (HCC)   Aspiration pneumonia (HCC)  1-Aspiration PNA; received IV antibiotics on admission. Now comfort care.   2-Sepsis; MRSA Bacteremia, decubitus ulcer. Received IV fluids. IV antibiotics. Family opted for comfort care.   3-PE, DVT; no anticoagulation; comfort care.   4-decubitus ulcer; local care.   Patient with multiples co morbidities, decubitus ulcer, admitted with multiples medical problems, infection, PNA, Bacteremia, PE. Agree with comfort care. Referral for residential hospice. Patient at [facility], suspect less than 2 weeks.   Code Status: DNR Family Communication: son and daughter at bedside.    Disposition Plan: residential hospice in 24 hours if stable.    Subjective: In bed, no responsive, eyes open.   Objective: Vitals:   08/11/17 1200 08/11/17 1247 08/11/17 1613 08/12/17 0452  BP: (!) 78/34  (!) 78/45 (!) 141/102  Pulse: (!) 118 (!) 116 (!) 136 (!) 115  Resp: (!) 21 (!) 29 (!) 26 14  Temp:   (!) 101.9 F (38.8 C) 99 F (37.2 C)  TempSrc:   Axillary Axillary  SpO2: 100%  (!) 86% (!) 87%    Intake/Output Summary (Last 24 hours) at 08/12/2017 1120 Last data filed at 08/12/2017 1026 Gross per 24 hour  Intake 500 ml  Output 0 ml  Net 500 ml   There were no vitals filed for this visit.  Examination:  General exam: Appears calm and comfortable  Respiratory system: Clear to auscultation. Respiratory effort normal. Cardiovascular system: S1 & S2 heard, RRR. No JVD, murmurs, rubs, gallops or clicks. No pedal edema. Gastrointestinal system: Abdomen is nondistended, soft and nontender. No organomegaly or masses felt. Normal bowel sounds heard. Central nervous system: lethargic,  Extremities: edema    Data Reviewed: I have personally reviewed following labs and imaging studies  CBC: Recent Labs  Lab 08/11/17 0430 08/11/17 1057  WBC 16.5* 4.9  NEUTROABS 14.9* 4.3  HGB 11.8* 9.7*  HCT 38.5* 32.5*  MCV 94.8 95.0  PLT 278 952   Basic Metabolic Panel: Recent Labs  Lab 08/11/17 0430 08/11/17 1057  NA 151*  --   K 3.9  --   CL 116*  --   CO2 24  --   GLUCOSE 133*  --   BUN 39*  --  CREATININE 1.26* 0.98  CALCIUM 7.9*  --    GFR: CrCl cannot be calculated (Unknown ideal weight.). Liver Function Tests: Recent Labs  Lab 08/11/17 0430  AST 35  ALT 28  ALKPHOS 72  BILITOT 1.3*  PROT 5.5*  ALBUMIN 1.9*   No results for input(s): LIPASE, AMYLASE in the last 168 hours. No results for input(s): AMMONIA in the last 168 hours. Coagulation Profile: Recent Labs  Lab 08/11/17 0430  INR 1.52   Cardiac Enzymes: No results for input(s): CKTOTAL,  CKMB, CKMBINDEX, TROPONINI in the last 168 hours. BNP (last 3 results) No results for input(s): PROBNP in the last 8760 hours. HbA1C: No results for input(s): HGBA1C in the last 72 hours. CBG: No results for input(s): GLUCAP in the last 168 hours. Lipid Profile: No results for input(s): CHOL, HDL, LDLCALC, TRIG, CHOLHDL, LDLDIRECT in the last 72 hours. Thyroid Function Tests: No results for input(s): TSH, T4TOTAL, FREET4, T3FREE, THYROIDAB in the last 72 hours. Anemia Panel: No results for input(s): VITAMINB12, FOLATE, FERRITIN, TIBC, IRON, RETICCTPCT in the last 72 hours. Sepsis Labs: Recent Labs  Lab 08/11/17 0452 08/11/17 0624 08/11/17 1057 08/11/17 1058  PROCALCITON  --   --  0.64  --   LATICACIDVEN 2.13* 2.61*  --  3.0*    Recent Results (from the past 240 hour(s))  Culture, blood (Routine x 2)     Status: None (Preliminary result)   Collection Time: 08/11/17  4:26 AM  Result Value Ref Range Status   Specimen Description BLOOD LEFT HAND  Final   Special Requests   Final    BOTTLES DRAWN AEROBIC AND ANAEROBIC Blood Culture adequate volume   Culture  Setup Time   Final    Organism ID to follow GRAM POSITIVE COCCI ANAEROBIC BOTTLE ONLY CRITICAL RESULT CALLED TO, READ BACK BY AND VERIFIED WITH: J [facility] PHARMD 08/12/17 0202 JDW    Culture GRAM POSITIVE COCCI  Final   Report Status PENDING  Incomplete  Blood Culture ID Panel (Reflexed)     Status: Abnormal   Collection Time: 08/11/17  4:26 AM  Result Value Ref Range Status   Enterococcus species NOT DETECTED NOT DETECTED Final   Listeria monocytogenes NOT DETECTED NOT DETECTED Final   Staphylococcus species DETECTED (A) NOT DETECTED Final    Comment: Methicillin (oxacillin) resistant coagulase negative staphylococcus. Possible blood culture contaminant (unless isolated from more than one blood culture draw or clinical case suggests pathogenicity). No antibiotic treatment is indicated for blood  culture  contaminants. CRITICAL RESULT CALLED TO, READ BACK BY AND VERIFIED WITH: J [facility] PHARMD 08/12/17 0202 JDW    Staphylococcus aureus NOT DETECTED NOT DETECTED Final   Methicillin resistance DETECTED (A) NOT DETECTED Final    Comment: CRITICAL RESULT CALLED TO, READ BACK BY AND VERIFIED WITH: J LEDFORD PHARMD 08/12/17 0202 JDW    Streptococcus species NOT DETECTED NOT DETECTED Final   Streptococcus agalactiae NOT DETECTED NOT DETECTED Final   Streptococcus pneumoniae NOT DETECTED NOT DETECTED Final   Streptococcus pyogenes NOT DETECTED NOT DETECTED Final   Acinetobacter baumannii NOT DETECTED NOT DETECTED Final   Enterobacteriaceae species NOT DETECTED NOT DETECTED Final   Enterobacter cloacae complex NOT DETECTED NOT DETECTED Final   Escherichia coli NOT DETECTED NOT DETECTED Final   Klebsiella oxytoca NOT DETECTED NOT DETECTED Final   Klebsiella pneumoniae NOT DETECTED NOT DETECTED Final   Proteus species NOT DETECTED NOT DETECTED Final   Serratia marcescens NOT DETECTED NOT DETECTED Final   Haemophilus influenzae  NOT DETECTED NOT DETECTED Final   Neisseria meningitidis NOT DETECTED NOT DETECTED Final   Pseudomonas aeruginosa NOT DETECTED NOT DETECTED Final   Candida albicans NOT DETECTED NOT DETECTED Final   Candida glabrata NOT DETECTED NOT DETECTED Final   Candida krusei NOT DETECTED NOT DETECTED Final   Candida parapsilosis NOT DETECTED NOT DETECTED Final   Candida tropicalis NOT DETECTED NOT DETECTED Final  Culture, blood (Routine x 2)     Status: None (Preliminary result)   Collection Time: 08/11/17  4:30 AM  Result Value Ref Range Status   Specimen Description BLOOD RIGHT WRIST  Final   Special Requests   Final    BOTTLES DRAWN AEROBIC AND ANAEROBIC Blood Culture results may not be optimal due to an inadequate volume of blood received in culture bottles   Culture  Setup Time   Final    GRAM POSITIVE COCCI AEROBIC BOTTLE ONLY CRITICAL VALUE NOTED.  VALUE IS CONSISTENT WITH  PREVIOUSLY REPORTED AND CALLED VALUE. Performed at [facility], [place] [address], [place], [place] [address]    Culture PENDING  Incomplete   Report Status PENDING  Incomplete  Urine culture     Status: None   Collection Time: 08/11/17  5:02 AM  Result Value Ref Range Status   Specimen Description URINE, CATHETERIZED  Final   Special Requests NONE  Final   Culture   Final    NO GROWTH Performed at [facility], [address], [place], [place] [address]    Report Status 08/12/2017 FINAL  Final         Radiology Studies: Ct Angio Chest Pe W And/or Wo Contrast  Addendum Date: 08/11/2017   ADDENDUM REPORT: 08/11/2017 12:09 ADDENDUM: These results were called by telephone on 08/11/2017 at 12:09 pm to [facility] , who verbally acknowledged these results. Electronically Signed   By: Titus Dubin M.D.   On: 08/11/2017 12:09   Result Date: 08/11/2017 CLINICAL DATA:  Shortness of breath and confusion. Abdominal pain. Fever. EXAM: CT ANGIOGRAPHY CHEST CT ABDOMEN AND PELVIS WITH CONTRAST TECHNIQUE: Multidetector CT imaging of the chest was performed using the standard protocol during bolus administration of intravenous contrast. Multiplanar CT image reconstructions and MIPs were obtained to evaluate the vascular anatomy. Multidetector CT imaging of the abdomen and pelvis was performed using the standard protocol during bolus administration of intravenous contrast. CONTRAST:  18mL ISOVUE-370 IOPAMIDOL (ISOVUE-370) INJECTION 76% COMPARISON:  CT abdomen pelvis dated October 10, 2014. FINDINGS: CTA CHEST FINDINGS Cardiovascular: Satisfactory opacification of the pulmonary arteries to the segmental level. Small peripheral filling defect in a left lower lobe subsegmental artery (series 3, image 67). No other evidence of pulmonary emboli. Normal heart size. No pericardial effusion. Normal caliber thoracic aorta. Coronary, aortic arch, and branch vessel atherosclerotic vascular disease.  Mediastinum/Nodes: Prominent subcentimeter hilar lymph nodes are likely reactive. No enlarged mediastinal or axillary lymph nodes. The thyroid gland trachea, and esophagus demonstrate no significant abnormalities. Lungs/Pleura: Dense consolidation in both lower lobes as well as the right middle lobe, with additional scattered peribronchovascular nodules and small areas of nodular consolidation throughout both lungs. No pleural effusion or pneumothorax. Musculoskeletal: No chest wall abnormality. No acute or significant osseous findings. Review of the MIP images confirms the above findings. CT ABDOMEN and PELVIS FINDINGS Hepatobiliary: Unchanged subcentimeter hypodense lesion within the left hepatic lobe, too small to characterize, but likely a cyst. The gallbladder is unremarkable. No biliary dilatation. Pancreas: Unremarkable. No pancreatic ductal dilatation or surrounding inflammatory  changes. Spleen: Normal in size without focal abnormality. Adrenals/Urinary Tract: Adrenal glands are unremarkable. Kidneys are normal, without renal calculi, focal lesion, or hydronephrosis. Bladder is unremarkable. Stomach/Bowel: Large amount of stool in the rectum and distal sigmoid colon. Redundant sigmoid colon. Mild left-sided colonic diverticulosis. The stomach and small bowel are unremarkable. No evidence of bowel obstruction. Normal appendix. Vascular/Lymphatic: Focal expansion with internal hypodensity in the right common femoral vein. Small area of hypodensity within the left common femoral vein. Aortic atherosclerosis. No lymphadenopathy. Reproductive: Mild central prostate gland enlargement. Other: No free fluid or pneumoperitoneum. Bilateral fat containing inguinal hernias. Musculoskeletal: No acute or significant osseous findings. Left paramidline sacral decubitus ulcer extending near the distal sacrum and coccyx. Review of the MIP images confirms the above findings. IMPRESSION: CTA chest: 1. Dense consolidation in  both lower lobes as well as the right middle lobe, with additional scattered peribronchovascular nodules and small areas of nodular consolidation throughout both lungs, concerning for multifocal pneumonia and/or aspiration. 2. Small peripheral filling defect in a left lower lobe subsegmental pulmonary artery, consistent with pulmonary embolus, likely acute given bilateral common femoral deep venous thrombosis. No additional pulmonary emboli. CT abdomen and pelvis: 1. Right greater than left common femoral vein deep venous thrombosis. 2. Left paramidline sacral decubitus ulcer extending near the distal sacrum and coccyx without CT evidence of underlying osteomyelitis. 3. Large amount of stool in the rectum and distal sigmoid colon. Correlate for constipation. 4.  Aortic atherosclerosis (ICD10-I70.0). Radiology assistant personnel have been notified to put me in telephone contact with the referring physician or the referring physician's clinical representative in order to discuss these findings. Once this communication is established I will issue an addendum to this report for documentation purposes. Electronically Signed: By: Titus Dubin M.D. On: 08/11/2017 11:27   Ct Abdomen Pelvis W Contrast  Addendum Date: 08/11/2017   ADDENDUM REPORT: 08/11/2017 12:09 ADDENDUM: These results were called by telephone on 08/11/2017 at 12:09 pm to [facility] , who verbally acknowledged these results. Electronically Signed   By: Titus Dubin M.D.   On: 08/11/2017 12:09   Result Date: 08/11/2017 CLINICAL DATA:  Shortness of breath and confusion. Abdominal pain. Fever. EXAM: CT ANGIOGRAPHY CHEST CT ABDOMEN AND PELVIS WITH CONTRAST TECHNIQUE: Multidetector CT imaging of the chest was performed using the standard protocol during bolus administration of intravenous contrast. Multiplanar CT image reconstructions and MIPs were obtained to evaluate the vascular anatomy. Multidetector CT imaging of the abdomen and pelvis was  performed using the standard protocol during bolus administration of intravenous contrast. CONTRAST:  151mL ISOVUE-370 IOPAMIDOL (ISOVUE-370) INJECTION 76% COMPARISON:  CT abdomen pelvis dated October 10, 2014. FINDINGS: CTA CHEST FINDINGS Cardiovascular: Satisfactory opacification of the pulmonary arteries to the segmental level. Small peripheral filling defect in a left lower lobe subsegmental artery (series 3, image 67). No other evidence of pulmonary emboli. Normal heart size. No pericardial effusion. Normal caliber thoracic aorta. Coronary, aortic arch, and branch vessel atherosclerotic vascular disease. Mediastinum/Nodes: Prominent subcentimeter hilar lymph nodes are likely reactive. No enlarged mediastinal or axillary lymph nodes. The thyroid gland trachea, and esophagus demonstrate no significant abnormalities. Lungs/Pleura: Dense consolidation in both lower lobes as well as the right middle lobe, with additional scattered peribronchovascular nodules and small areas of nodular consolidation throughout both lungs. No pleural effusion or pneumothorax. Musculoskeletal: No chest wall abnormality. No acute or significant osseous findings. Review of the MIP images confirms the above findings. CT ABDOMEN and PELVIS FINDINGS Hepatobiliary: Unchanged subcentimeter hypodense lesion within  the left hepatic lobe, too small to characterize, but likely a cyst. The gallbladder is unremarkable. No biliary dilatation. Pancreas: Unremarkable. No pancreatic ductal dilatation or surrounding inflammatory changes. Spleen: Normal in size without focal abnormality. Adrenals/Urinary Tract: Adrenal glands are unremarkable. Kidneys are normal, without renal calculi, focal lesion, or hydronephrosis. Bladder is unremarkable. Stomach/Bowel: Large amount of stool in the rectum and distal sigmoid colon. Redundant sigmoid colon. Mild left-sided colonic diverticulosis. The stomach and small bowel are unremarkable. No evidence of bowel  obstruction. Normal appendix. Vascular/Lymphatic: Focal expansion with internal hypodensity in the right common femoral vein. Small area of hypodensity within the left common femoral vein. Aortic atherosclerosis. No lymphadenopathy. Reproductive: Mild central prostate gland enlargement. Other: No free fluid or pneumoperitoneum. Bilateral fat containing inguinal hernias. Musculoskeletal: No acute or significant osseous findings. Left paramidline sacral decubitus ulcer extending near the distal sacrum and coccyx. Review of the MIP images confirms the above findings. IMPRESSION: CTA chest: 1. Dense consolidation in both lower lobes as well as the right middle lobe, with additional scattered peribronchovascular nodules and small areas of nodular consolidation throughout both lungs, concerning for multifocal pneumonia and/or aspiration. 2. Small peripheral filling defect in a left lower lobe subsegmental pulmonary artery, consistent with pulmonary embolus, likely acute given bilateral common femoral deep venous thrombosis. No additional pulmonary emboli. CT abdomen and pelvis: 1. Right greater than left common femoral vein deep venous thrombosis. 2. Left paramidline sacral decubitus ulcer extending near the distal sacrum and coccyx without CT evidence of underlying osteomyelitis. 3. Large amount of stool in the rectum and distal sigmoid colon. Correlate for constipation. 4.  Aortic atherosclerosis (ICD10-I70.0). Radiology assistant personnel have been notified to put me in telephone contact with the referring physician or the referring physician's clinical representative in order to discuss these findings. Once this communication is established I will issue an addendum to this report for documentation purposes. Electronically Signed: By: Titus Dubin M.D. On: 08/11/2017 11:27   Dg Chest Port 1 View  Result Date: 08/11/2017 CLINICAL DATA:  Sepsis. EXAM: PORTABLE CHEST 1 VIEW COMPARISON:  08/11/2017 FINDINGS:  Indistinct left perihilar and bilateral infrahilar airspace opacities are present. The patient is rotated to the right on today's radiograph, reducing diagnostic sensitivity and specificity. Atherosclerotic calcification of the aortic arch. IMPRESSION: 1. Bilateral infrahilar and perihilar opacities favoring pneumonia or aspiration pneumonitis. Followup PA and lateral chest X-ray is recommended in 3-4 weeks following trial of antibiotic therapy to ensure resolution and exclude underlying malignancy. Electronically Signed   By: Van Clines M.D.   On: 08/11/2017 09:50   Dg Chest Port 1 View  Result Date: 08/11/2017 CLINICAL DATA:  Altered mental status, hypoxia. EXAM: PORTABLE CHEST 1 VIEW COMPARISON:  Chest radiograph May 08, 2017 FINDINGS: Cardiac silhouette is normal,, calcified aortic knob. Coarsened bibasilar pulmonary interstitium. No pleural effusion. No pneumothorax. Soft tissue planes and included osseous structures are non suspicious. IMPRESSION: Bibasilar interstitial prominence seen with atypical infection, atelectasis or fibrosis. Aortic Atherosclerosis (ICD10-I70.0). Electronically Signed   By: Elon Alas M.D.   On: 08/11/2017 04:50        Scheduled Meds: . white petrolatum      . sodium chloride flush  3 mL Intravenous Q12H   Continuous Infusions: . sodium chloride       LOS: 1 day    Time spent: 35 minutes.     Elmarie Shiley, MD [facility] Pager [phone]  If 7PM-7AM, please contact night-coverage [email or web address] Password [email or web address] 08/12/2017, 11:20 AM

## 2017-08-12 NOTE — Progress Notes (Signed)
Nutrition Brief Note  Chart reviewed. Pt now transitioning to comfort care.  No further nutrition interventions warranted at this time.  Please re-consult as needed.   Kesler Wickham A. Halen Mossbarger, RD, LDN, CDE Pager: [phone] After hours Pager: [phone]  

## 2017-08-13 LAB — CULTURE, BLOOD (ROUTINE X 2): SPECIAL REQUESTS: ADEQUATE

## 2017-08-13 MED ORDER — LORAZEPAM 2 MG/ML IJ SOLN: 1.0000 mg | INTRAMUSCULAR | 0 refills | Status: AC | PRN

## 2017-08-13 MED ORDER — MORPHINE SULFATE 10 MG/5ML PO SOLN: 2.5000 mg | ORAL | 0 refills | Status: AC | PRN

## 2017-08-13 MED ORDER — HALOPERIDOL LACTATE 2 MG/ML PO CONC: 1.0000 mg | ORAL | 0 refills | Status: AC | PRN

## 2017-08-13 MED ORDER — ACETAMINOPHEN 10 MG/ML IV SOLN
1000.0000 mg | Freq: Once | INTRAVENOUS | Status: AC
  Administered 2017-08-13: 1000 mg via INTRAVENOUS
  Filled 2017-08-13: qty 100

## 2017-08-13 MED ORDER — GLYCOPYRROLATE 1 MG PO TABS: 1.0000 mg | ORAL_TABLET | ORAL | 0 refills | Status: AC | PRN

## 2017-08-13 MED ORDER — MORPHINE SULFATE 10 MG/5ML PO SOLN: 2.5000 mg | ORAL | Status: DC | PRN

## 2017-08-13 NOTE — Discharge Summary (Signed)
Physician Discharge Summary  Louis Patel HUT:[id number] DOB: [birth year] DOA: 08/11/2017  PCP: Orvis Brill, [facility]  Admit date: 08/11/2017 Discharge date: 08/13/2017  Admitted From: SNF Disposition:  Residential hospice.   Recommendations for Outpatient Follow-up:  1. Comfort care.      Discharge Condition: Hospice CODE STATUS: DNR Diet recommendation: comfort feeding.   Brief/Interim Summary:  Louis Patel a 84 Patel a Past Medical History of dementia, diverticulosis, enlarged prostate, hyperlipidemia, hypertension and hypothyroidism who presents with altered mental status associated with worsening shortness of breath from his SNF facility. He is noted to have a large sacral decubitus and presented with sepsis of unclear etiology. His flu test was negative. Chest x-ray showed findings consistent with pneumonia, possible aspiration.   CT angio showed aspiration PNA, PE, and common femoral vein DVT> he was admitted with sepsis, PNA, aspiration, decubitus ulcer, MRSA Bacteremia on blood culture. Family discussed with admitting physician and decision was for comfort care.   I met with patient son and daughter, both [place]. Explain to them diferent diagnosis. They comfirm goal of for comfort care, no IV antibioitcs, no IV fluids, no anticoagulation. Introduce idea of residential hospice. They agree.   Assessment & Plan:   Principal Problem:   Severe sepsis ([place]) Active Problems:   Depression   Hypothyroidism   Mild dementia   Acute respiratory failure (HCC)   Pressure injury of skin   Pulmonary embolism (HCC)   DVT (deep venous thrombosis) (HCC)   Aspiration pneumonia (HCC)  1-Aspiration PNA; received IV antibiotics on admission. Now comfort care.   2-Sepsis; MRSA Bacteremia, decubitus ulcer. Received IV fluids. IV antibiotics. Family opted for comfort care.   3-PE, DVT; no anticoagulation; comfort care.   4-decubitus ulcer; local care.    Patient with multiples co morbidities, decubitus ulcer, admitted with multiples medical problems, infection, PNA, Bacteremia, PE. Agree with comfort care. Referral for residential hospice. Patient at [facility], suspect less than 2 weeks.   Patient Vitals has been stable since yesterday, SBP 100 range. Will transfer him to residential hospice.      Discharge Diagnoses:  Principal Problem:   Severe sepsis ([place]) Active Problems:   Depression   Hypothyroidism   Mild dementia   Acute respiratory failure (HCC)   Pressure injury of skin   Pulmonary embolism (HCC)   DVT (deep venous thrombosis) (HCC)   Aspiration pneumonia [facility])    Discharge Instructions  Discharge Instructions    Diet - low sodium heart healthy   Complete by:  As directed    Increase activity slowly   Complete by:  As directed      Allergies as of 08/13/2017   No Known Allergies     Medication List    STOP taking these medications   ARGINAID Pack   DECUBI-VITE Caps   divalproex 250 MG 24 hr tablet Commonly known as:  DEPAKOTE ER   docusate sodium 100 MG capsule Commonly known as:  COLACE   donepezil 5 MG tablet Commonly known as:  ARICEPT   feeding supplement (PRO-STAT SUGAR FREE 64) Liqd   finasteride 5 MG tablet Commonly known as:  PROSCAR   levothyroxine 50 MCG tablet Commonly known as:  SYNTHROID, LEVOTHROID   lithium carbonate 300 MG capsule   memantine 10 MG tablet Commonly known as:  NAMENDA   risperiDONE 0.5 MG tablet Commonly known as:  RISPERDAL   vitamin C 250 MG tablet Commonly known as:  ASCORBIC ACID   zinc sulfate 220 (50  Zn) MG capsule     TAKE these medications   acetaminophen 325 MG tablet Commonly known as:  TYLENOL Take 2 tablets (650 mg total) every 4 (four) hours as needed by mouth for mild pain (or Fever >/= 101).   glycopyrrolate 1 MG tablet Commonly known as:  ROBINUL Take 1 tablet (1 mg total) by mouth every 4 (four) hours as needed (excessive  secretions).   haloperidol 2 MG/ML solution Commonly known as:  HALDOL Place 0.5 mLs (1 mg total) under the tongue every 4 (four) hours as needed for agitation (or delirium).   LORazepam 2 MG/ML injection Commonly known as:  ATIVAN Inject 0.5 mLs (1 mg total) into the vein every 4 (four) hours as needed for seizure.   morphine 10 MG/5ML solution Take 1.3 mLs (2.6 mg total) by mouth every 2 (two) hours as needed for moderate pain.       No Known Allergies  Consultations: none  Procedures/Studies: Ct Angio Chest Pe W And/or Wo Contrast  Addendum Date: 08/11/2017   ADDENDUM REPORT: 08/11/2017 12:09 ADDENDUM: These results were called by telephone on 08/11/2017 at 12:09 pm to [facility] , who verbally acknowledged these results. Electronically Signed   By: Titus Dubin M.D.   On: 08/11/2017 12:09   Result Date: 08/11/2017 CLINICAL DATA:  Shortness of breath and confusion. Abdominal pain. Fever. EXAM: CT ANGIOGRAPHY CHEST CT ABDOMEN AND PELVIS WITH CONTRAST TECHNIQUE: Multidetector CT imaging of the chest was performed using the standard protocol during bolus administration of intravenous contrast. Multiplanar CT image reconstructions and MIPs were obtained to evaluate the vascular anatomy. Multidetector CT imaging of the abdomen and pelvis was performed using the standard protocol during bolus administration of intravenous contrast. CONTRAST:  141m ISOVUE-370 IOPAMIDOL (ISOVUE-370) INJECTION 76% COMPARISON:  CT abdomen pelvis dated October 10, 2014. FINDINGS: CTA CHEST FINDINGS Cardiovascular: Satisfactory opacification of the pulmonary arteries to the segmental level. Small peripheral filling defect in a left lower lobe subsegmental artery (series 3, image 67). No other evidence of pulmonary emboli. Normal heart size. No pericardial effusion. Normal caliber thoracic aorta. Coronary, aortic arch, and branch vessel atherosclerotic vascular disease. Mediastinum/Nodes: Prominent subcentimeter  hilar lymph nodes are likely reactive. No enlarged mediastinal or axillary lymph nodes. The thyroid gland trachea, and esophagus demonstrate no significant abnormalities. Lungs/Pleura: Dense consolidation in both lower lobes as well as the right middle lobe, with additional scattered peribronchovascular nodules and small areas of nodular consolidation throughout both lungs. No pleural effusion or pneumothorax. Musculoskeletal: No chest wall abnormality. No acute or significant osseous findings. Review of the MIP images confirms the above findings. CT ABDOMEN and PELVIS FINDINGS Hepatobiliary: Unchanged subcentimeter hypodense lesion within the left hepatic lobe, too small to characterize, but likely a cyst. The gallbladder is unremarkable. No biliary dilatation. Pancreas: Unremarkable. No pancreatic ductal dilatation or surrounding inflammatory changes. Spleen: Normal in size without focal abnormality. Adrenals/Urinary Tract: Adrenal glands are unremarkable. Kidneys are normal, without renal calculi, focal lesion, or hydronephrosis. Bladder is unremarkable. Stomach/Bowel: Large amount of stool in the rectum and distal sigmoid colon. Redundant sigmoid colon. Mild left-sided colonic diverticulosis. The stomach and small bowel are unremarkable. No evidence of bowel obstruction. Normal appendix. Vascular/Lymphatic: Focal expansion with internal hypodensity in the right common femoral vein. Small area of hypodensity within the left common femoral vein. Aortic atherosclerosis. No lymphadenopathy. Reproductive: Mild central prostate gland enlargement. Other: No free fluid or pneumoperitoneum. Bilateral fat containing inguinal hernias. Musculoskeletal: No acute or significant osseous findings. Left paramidline sacral  decubitus ulcer extending near the distal sacrum and coccyx. Review of the MIP images confirms the above findings. IMPRESSION: CTA chest: 1. Dense consolidation in both lower lobes as well as the right middle  lobe, with additional scattered peribronchovascular nodules and small areas of nodular consolidation throughout both lungs, concerning for multifocal pneumonia and/or aspiration. 2. Small peripheral filling defect in a left lower lobe subsegmental pulmonary artery, consistent with pulmonary embolus, likely acute given bilateral common femoral deep venous thrombosis. No additional pulmonary emboli. CT abdomen and pelvis: 1. Right greater than left common femoral vein deep venous thrombosis. 2. Left paramidline sacral decubitus ulcer extending near the distal sacrum and coccyx without CT evidence of underlying osteomyelitis. 3. Large amount of stool in the rectum and distal sigmoid colon. Correlate for constipation. 4.  Aortic atherosclerosis (ICD10-I70.0). Radiology assistant personnel have been notified to put me in telephone contact with the referring physician or the referring physician's clinical representative in order to discuss these findings. Once this communication is established I will issue an addendum to this report for documentation purposes. Electronically Signed: By: Titus Dubin M.D. On: 08/11/2017 11:27   Ct Abdomen Pelvis W Contrast  Addendum Date: 08/11/2017   ADDENDUM REPORT: 08/11/2017 12:09 ADDENDUM: These results were called by telephone on 08/11/2017 at 12:09 pm to [facility] , who verbally acknowledged these results. Electronically Signed   By: Titus Dubin M.D.   On: 08/11/2017 12:09   Result Date: 08/11/2017 CLINICAL DATA:  Shortness of breath and confusion. Abdominal pain. Fever. EXAM: CT ANGIOGRAPHY CHEST CT ABDOMEN AND PELVIS WITH CONTRAST TECHNIQUE: Multidetector CT imaging of the chest was performed using the standard protocol during bolus administration of intravenous contrast. Multiplanar CT image reconstructions and MIPs were obtained to evaluate the vascular anatomy. Multidetector CT imaging of the abdomen and pelvis was performed using the standard protocol during  bolus administration of intravenous contrast. CONTRAST:  174m ISOVUE-370 IOPAMIDOL (ISOVUE-370) INJECTION 76% COMPARISON:  CT abdomen pelvis dated October 10, 2014. FINDINGS: CTA CHEST FINDINGS Cardiovascular: Satisfactory opacification of the pulmonary arteries to the segmental level. Small peripheral filling defect in a left lower lobe subsegmental artery (series 3, image 67). No other evidence of pulmonary emboli. Normal heart size. No pericardial effusion. Normal caliber thoracic aorta. Coronary, aortic arch, and branch vessel atherosclerotic vascular disease. Mediastinum/Nodes: Prominent subcentimeter hilar lymph nodes are likely reactive. No enlarged mediastinal or axillary lymph nodes. The thyroid gland trachea, and esophagus demonstrate no significant abnormalities. Lungs/Pleura: Dense consolidation in both lower lobes as well as the right middle lobe, with additional scattered peribronchovascular nodules and small areas of nodular consolidation throughout both lungs. No pleural effusion or pneumothorax. Musculoskeletal: No chest wall abnormality. No acute or significant osseous findings. Review of the MIP images confirms the above findings. CT ABDOMEN and PELVIS FINDINGS Hepatobiliary: Unchanged subcentimeter hypodense lesion within the left hepatic lobe, too small to characterize, but likely a cyst. The gallbladder is unremarkable. No biliary dilatation. Pancreas: Unremarkable. No pancreatic ductal dilatation or surrounding inflammatory changes. Spleen: Normal in size without focal abnormality. Adrenals/Urinary Tract: Adrenal glands are unremarkable. Kidneys are normal, without renal calculi, focal lesion, or hydronephrosis. Bladder is unremarkable. Stomach/Bowel: Large amount of stool in the rectum and distal sigmoid colon. Redundant sigmoid colon. Mild left-sided colonic diverticulosis. The stomach and small bowel are unremarkable. No evidence of bowel obstruction. Normal appendix. Vascular/Lymphatic:  Focal expansion with internal hypodensity in the right common femoral vein. Small area of hypodensity within the left common femoral vein. Aortic atherosclerosis.  No lymphadenopathy. Reproductive: Mild central prostate gland enlargement. Other: No free fluid or pneumoperitoneum. Bilateral fat containing inguinal hernias. Musculoskeletal: No acute or significant osseous findings. Left paramidline sacral decubitus ulcer extending near the distal sacrum and coccyx. Review of the MIP images confirms the above findings. IMPRESSION: CTA chest: 1. Dense consolidation in both lower lobes as well as the right middle lobe, with additional scattered peribronchovascular nodules and small areas of nodular consolidation throughout both lungs, concerning for multifocal pneumonia and/or aspiration. 2. Small peripheral filling defect in a left lower lobe subsegmental pulmonary artery, consistent with pulmonary embolus, likely acute given bilateral common femoral deep venous thrombosis. No additional pulmonary emboli. CT abdomen and pelvis: 1. Right greater than left common femoral vein deep venous thrombosis. 2. Left paramidline sacral decubitus ulcer extending near the distal sacrum and coccyx without CT evidence of underlying osteomyelitis. 3. Large amount of stool in the rectum and distal sigmoid colon. Correlate for constipation. 4.  Aortic atherosclerosis (ICD10-I70.0). Radiology assistant personnel have been notified to put me in telephone contact with the referring physician or the referring physician's clinical representative in order to discuss these findings. Once this communication is established I will issue an addendum to this report for documentation purposes. Electronically Signed: By: Titus Dubin M.D. On: 08/11/2017 11:27   Dg Chest Port 1 View  Result Date: 08/11/2017 CLINICAL DATA:  Sepsis. EXAM: PORTABLE CHEST 1 VIEW COMPARISON:  08/11/2017 FINDINGS: Indistinct left perihilar and bilateral infrahilar  airspace opacities are present. The patient is rotated to the right on today's radiograph, reducing diagnostic sensitivity and specificity. Atherosclerotic calcification of the aortic arch. IMPRESSION: 1. Bilateral infrahilar and perihilar opacities favoring pneumonia or aspiration pneumonitis. Followup PA and lateral chest X-ray is recommended in 3-4 weeks following trial of antibiotic therapy to ensure resolution and exclude underlying malignancy. Electronically Signed   By: Van Clines M.D.   On: 08/11/2017 09:50   Dg Chest Port 1 View  Result Date: 08/11/2017 CLINICAL DATA:  Altered mental status, hypoxia. EXAM: PORTABLE CHEST 1 VIEW COMPARISON:  Chest radiograph May 08, 2017 FINDINGS: Cardiac silhouette is normal,, calcified aortic knob. Coarsened bibasilar pulmonary interstitium. No pleural effusion. No pneumothorax. Soft tissue planes and included osseous structures are non suspicious. IMPRESSION: Bibasilar interstitial prominence seen with atypical infection, atelectasis or fibrosis. Aortic Atherosclerosis (ICD10-I70.0). Electronically Signed   By: Elon Alas M.D.   On: 08/11/2017 04:50      Subjective: Lethargic.   Discharge Exam: Vitals:   08/13/17 0452 08/13/17 1039  BP: 112/79 105/76  Pulse: (!) 122 (!) 114  Resp: 20 20  Temp: 99.2 F (37.3 C) (!) 100.8 F (38.2 C)  SpO2: (!) 85% (!) 85%   Vitals:   08/11/17 1613 08/12/17 0452 08/13/17 0452 08/13/17 1039  BP: (!) 78/45 (!) 141/102 112/79 105/76  Pulse: (!) 136 (!) 115 (!) 122 (!) 114  Resp: (!) '26 14 20 20  '$ Temp: (!) 101.9 F (38.8 C) 99 F (37.2 C) 99.2 F (37.3 C) (!) 100.8 F (38.2 C)  TempSrc: Axillary Axillary Axillary Oral  SpO2: (!) 86% (!) 87% (!) 85% (!) 85%  Weight:   80.5 kg (177 lb 7.5 oz)     General: lethargic, in no distress.  Cardiovascular: RRR, S1/S2 +, no rubs, no gallops Respiratory: bilateral ronchus.  Abdominal: Soft, NT, ND, bowel sounds + Extremities: no edema, no  cyanosis    The results of significant diagnostics from this hospitalization (including imaging, microbiology, ancillary and laboratory) are listed below  for reference.     Microbiology: Recent Results (from the past 240 hour(s))  Culture, blood (Routine x 2)     Status: Abnormal   Collection Time: 08/11/17  4:26 AM  Result Value Ref Range Status   Specimen Description BLOOD LEFT HAND  Final   Special Requests   Final    BOTTLES DRAWN AEROBIC AND ANAEROBIC Blood Culture adequate volume   Culture  Setup Time   Final    GRAM POSITIVE COCCI IN BOTH AEROBIC AND ANAEROBIC BOTTLES CRITICAL RESULT CALLED TO, READ BACK BY AND VERIFIED WITH: J LEDFORD PHARMD 08/12/17 0202 JDW    Culture (A)  Final    STAPHYLOCOCCUS SPECIES (COAGULASE NEGATIVE) THE SIGNIFICANCE OF ISOLATING THIS ORGANISM FROM A SINGLE SET OF BLOOD CULTURES WHEN MULTIPLE SETS ARE DRAWN IS UNCERTAIN. PLEASE NOTIFY THE MICROBIOLOGY DEPARTMENT WITHIN ONE WEEK IF SPECIATION AND SENSITIVITIES ARE REQUIRED. Performed at [facility], [place] [address], [place], [place] [address]    Report Status 08/13/2017 FINAL  Final  Blood Culture ID Panel (Reflexed)     Status: Abnormal   Collection Time: 08/11/17  4:26 AM  Result Value Ref Range Status   Enterococcus species NOT DETECTED NOT DETECTED Final   Listeria monocytogenes NOT DETECTED NOT DETECTED Final   Staphylococcus species DETECTED (A) NOT DETECTED Final    Comment: Methicillin (oxacillin) resistant coagulase negative staphylococcus. Possible blood culture contaminant (unless isolated from more than one blood culture draw or clinical case suggests pathogenicity). No antibiotic treatment is indicated for blood  culture contaminants. CRITICAL RESULT CALLED TO, READ BACK BY AND VERIFIED WITH: J LEDFORD PHARMD 08/12/17 0202 JDW    Staphylococcus aureus NOT DETECTED NOT DETECTED Final   Methicillin resistance DETECTED (A) NOT DETECTED Final    Comment: CRITICAL RESULT CALLED TO,  READ BACK BY AND VERIFIED WITH: J LEDFORD PHARMD 08/12/17 0202 JDW    Streptococcus species NOT DETECTED NOT DETECTED Final   Streptococcus agalactiae NOT DETECTED NOT DETECTED Final   Streptococcus pneumoniae NOT DETECTED NOT DETECTED Final   Streptococcus pyogenes NOT DETECTED NOT DETECTED Final   Acinetobacter baumannii NOT DETECTED NOT DETECTED Final   Enterobacteriaceae species NOT DETECTED NOT DETECTED Final   Enterobacter cloacae complex NOT DETECTED NOT DETECTED Final   Escherichia coli NOT DETECTED NOT DETECTED Final   Klebsiella oxytoca NOT DETECTED NOT DETECTED Final   Klebsiella pneumoniae NOT DETECTED NOT DETECTED Final   Proteus species NOT DETECTED NOT DETECTED Final   Serratia marcescens NOT DETECTED NOT DETECTED Final   Haemophilus influenzae NOT DETECTED NOT DETECTED Final   Neisseria meningitidis NOT DETECTED NOT DETECTED Final   Pseudomonas aeruginosa NOT DETECTED NOT DETECTED Final   Candida albicans NOT DETECTED NOT DETECTED Final   Candida glabrata NOT DETECTED NOT DETECTED Final   Candida krusei NOT DETECTED NOT DETECTED Final   Candida parapsilosis NOT DETECTED NOT DETECTED Final   Candida tropicalis NOT DETECTED NOT DETECTED Final  Culture, blood (Routine x 2)     Status: None (Preliminary result)   Collection Time: 08/11/17  4:30 AM  Result Value Ref Range Status   Specimen Description BLOOD RIGHT WRIST  Final   Special Requests   Final    BOTTLES DRAWN AEROBIC AND ANAEROBIC Blood Culture results may not be optimal due to an inadequate volume of blood received in culture bottles   Culture  Setup Time   Final    GRAM POSITIVE COCCI AEROBIC BOTTLE ONLY CRITICAL VALUE NOTED.  VALUE IS CONSISTENT WITH  PREVIOUSLY REPORTED AND CALLED VALUE.    Culture   Final    NO GROWTH 1 DAY Performed at [facility], [place] [address] [place], [place] [address]    Report Status PENDING  Incomplete  Urine culture     Status: None   Collection Time: 08/11/17  5:02 AM   Result Value Ref Range Status   Specimen Description URINE, CATHETERIZED  Final   Special Requests NONE  Final   Culture   Final    NO GROWTH Performed at [facility], [address] [place], [place] [address]    Report Status 08/12/2017 FINAL  Final     Labs: BNP (last 3 results) Recent Labs    02/20/17 1611  BNP 29.5   Basic Metabolic Panel: Recent Labs  Lab 08/11/17 0430 08/11/17 1057  NA 151*  --   K 3.9  --   CL 116*  --   CO2 24  --   GLUCOSE 133*  --   BUN 39*  --   CREATININE 1.26* 0.98  CALCIUM 7.9*  --    Liver Function Tests: Recent Labs  Lab 08/11/17 0430  AST 35  ALT 28  ALKPHOS 72  BILITOT 1.3*  PROT 5.5*  ALBUMIN 1.9*   No results for input(s): LIPASE, AMYLASE in the last 168 hours. No results for input(s): AMMONIA in the last 168 hours. CBC: Recent Labs  Lab 08/11/17 0430 08/11/17 1057  WBC 16.5* 4.9  NEUTROABS 14.9* 4.3  HGB 11.8* 9.7*  HCT 38.5* 32.5*  MCV 94.8 95.0  PLT 278 212   Cardiac Enzymes: No results for input(s): CKTOTAL, CKMB, CKMBINDEX, TROPONINI in the last 168 hours. BNP: Invalid input(s): POCBNP CBG: No results for input(s): GLUCAP in the last 168 hours. D-Dimer No results for input(s): DDIMER in the last 72 hours. Hgb A1c No results for input(s): HGBA1C in the last 72 hours. Lipid Profile No results for input(s): CHOL, HDL, LDLCALC, TRIG, CHOLHDL, LDLDIRECT in the last 72 hours. Thyroid function studies No results for input(s): TSH, T4TOTAL, T3FREE, THYROIDAB in the last 72 hours.  Invalid input(s): FREET3 Anemia work up No results for input(s): VITAMINB12, FOLATE, FERRITIN, TIBC, IRON, RETICCTPCT in the last 72 hours. Urinalysis    Component Value Date/Time   COLORURINE AMBER (A) 08/11/2017 0440   APPEARANCEUR CLEAR 08/11/2017 0440   LABSPEC 1.020 08/11/2017 0440   PHURINE 5.0 08/11/2017 0440   GLUCOSEU NEGATIVE 08/11/2017 0440   HGBUR NEGATIVE 08/11/2017 0440   HGBUR negative 06/27/2009  0809   BILIRUBINUR NEGATIVE 08/11/2017 0440   BILIRUBINUR neg 07/14/2015 1124   KETONESUR NEGATIVE 08/11/2017 0440   PROTEINUR NEGATIVE 08/11/2017 0440   UROBILINOGEN 0.2 07/14/2015 1124   UROBILINOGEN 0.2 10/10/2014 2256   NITRITE NEGATIVE 08/11/2017 0440   LEUKOCYTESUR NEGATIVE 08/11/2017 0440   Sepsis Labs Invalid input(s): PROCALCITONIN,  WBC,  LACTICIDVEN Microbiology Recent Results (from the past 240 hour(s))  Culture, blood (Routine x 2)     Status: Abnormal   Collection Time: 08/11/17  4:26 AM  Result Value Ref Range Status   Specimen Description BLOOD LEFT HAND  Final   Special Requests   Final    BOTTLES DRAWN AEROBIC AND ANAEROBIC Blood Culture adequate volume   Culture  Setup Time   Final    GRAM POSITIVE COCCI IN BOTH AEROBIC AND ANAEROBIC BOTTLES CRITICAL RESULT CALLED TO, READ BACK BY AND VERIFIED WITH: J [facility] PHARMD 08/12/17 0202 JDW    Culture (A)  Final    STAPHYLOCOCCUS SPECIES (COAGULASE NEGATIVE) THE SIGNIFICANCE OF ISOLATING THIS ORGANISM FROM A SINGLE SET OF BLOOD CULTURES WHEN MULTIPLE SETS ARE DRAWN IS UNCERTAIN. PLEASE NOTIFY THE MICROBIOLOGY DEPARTMENT WITHIN ONE WEEK IF SPECIATION AND SENSITIVITIES ARE REQUIRED. Performed at [facility], [place] [address], [place], [place] [address]    Report Status 08/13/2017 FINAL  Final  Blood Culture ID Panel (Reflexed)     Status: Abnormal   Collection Time: 08/11/17  4:26 AM  Result Value Ref Range Status   Enterococcus species NOT DETECTED NOT DETECTED Final   Listeria monocytogenes NOT DETECTED NOT DETECTED Final   Staphylococcus species DETECTED (A) NOT DETECTED Final    Comment: Methicillin (oxacillin) resistant coagulase negative staphylococcus. Possible blood culture contaminant (unless isolated from more than one blood culture draw or clinical case suggests pathogenicity). No antibiotic treatment is indicated for blood  culture contaminants. CRITICAL RESULT CALLED TO, READ BACK BY AND VERIFIED  WITH: J LEDFORD PHARMD 08/12/17 0202 JDW    Staphylococcus aureus NOT DETECTED NOT DETECTED Final   Methicillin resistance DETECTED (A) NOT DETECTED Final    Comment: CRITICAL RESULT CALLED TO, READ BACK BY AND VERIFIED WITH: J LEDFORD PHARMD 08/12/17 0202 JDW    Streptococcus species NOT DETECTED NOT DETECTED Final   Streptococcus agalactiae NOT DETECTED NOT DETECTED Final   Streptococcus pneumoniae NOT DETECTED NOT DETECTED Final   Streptococcus pyogenes NOT DETECTED NOT DETECTED Final   Acinetobacter baumannii NOT DETECTED NOT DETECTED Final   Enterobacteriaceae species NOT DETECTED NOT DETECTED Final   Enterobacter cloacae complex NOT DETECTED NOT DETECTED Final   Escherichia coli NOT DETECTED NOT DETECTED Final   Klebsiella oxytoca NOT DETECTED NOT DETECTED Final   Klebsiella pneumoniae NOT DETECTED NOT DETECTED Final   Proteus species NOT DETECTED NOT DETECTED Final   Serratia marcescens NOT DETECTED NOT DETECTED Final   Haemophilus influenzae NOT DETECTED NOT DETECTED Final   Neisseria meningitidis NOT DETECTED NOT DETECTED Final   Pseudomonas aeruginosa NOT DETECTED NOT DETECTED Final   Candida albicans NOT DETECTED NOT DETECTED Final   Candida glabrata NOT DETECTED NOT DETECTED Final   Candida krusei NOT DETECTED NOT DETECTED Final   Candida parapsilosis NOT DETECTED NOT DETECTED Final   Candida tropicalis NOT DETECTED NOT DETECTED Final  Culture, blood (Routine x 2)     Status: None (Preliminary result)   Collection Time: 08/11/17  4:30 AM  Result Value Ref Range Status   Specimen Description BLOOD RIGHT WRIST  Final   Special Requests   Final    BOTTLES DRAWN AEROBIC AND ANAEROBIC Blood Culture results may not be optimal due to an inadequate volume of blood received in culture bottles   Culture  Setup Time   Final    GRAM POSITIVE COCCI AEROBIC BOTTLE ONLY CRITICAL VALUE NOTED.  VALUE IS CONSISTENT WITH PREVIOUSLY REPORTED AND CALLED VALUE.    Culture   Final    NO  GROWTH 1 DAY Performed at [facility], [place] [address] [place], [place] [address]    Report Status PENDING  Incomplete  Urine culture     Status: None   Collection Time: 08/11/17  5:02 AM  Result Value Ref Range Status   Specimen Description URINE, CATHETERIZED  Final   Special Requests NONE  Final   Culture   Final    NO GROWTH Performed at [facility], [address] [place], [place] [address]    Report Status 08/12/2017 FINAL  Final     Time coordinating discharge: Over 30 minutes  SIGNED:   Elmarie Shiley, MD  [facility] 08/13/2017, 10:53 AM Pager [phone]  If 7PM-7AM, please contact night-coverage [email or web address] Password [email or web address]

## 2017-08-13 NOTE — Social Work (Addendum)
CSW spoke with pt family, they are okay with [facility] coming to do paperwork.  [facility] liaison [facility] [place] will be up to complete paperwork with family. CSW will arrange transport via [facility] when paperwork complete.  1:09pm- Clinical Social Worker facilitated patient discharge including contacting patient family and facility to confirm patient discharge plans.  Clinical information faxed to facility and family agreeable with plan.  CSW arranged ambulance transport via [facility] to [facility] at 2:30pm.  RN to call [phone] with report  prior to discharge.  Clinical Social Worker will sign off for now as social work intervention is no longer needed. Please consult [place] again if new need arises.    Alexander [place], [place] Work [phone]

## 2017-08-13 NOTE — Consult Note (Signed)
[facility]:   Met with pt's son at bedside. Reviewed Hospice care and philosophy, confirmed interest in transferring pt to our facility for end of life care. Offered bed and family accepted. Notified MD and MSW who will be arranging transport. Webb Silversmith RN [phone]

## 2017-08-16 LAB — CULTURE, BLOOD (ROUTINE X 2): CULTURE: NO GROWTH

## 2017-09-05 DEATH — deceased

## 2018-06-27 IMAGING — CT CT ABD-PELV W/ CM
2 of 8 series · 13 of 46 positions shown, 15 images · IV contrast (iopamidol)
Comparison: CT abdomen pelvis dated October 10, 2014.

ADDENDUM:
These results were called by telephone on 08/11/2017 at [DATE] to
FERIENHAUS ERXLEBEN , who verbally acknowledged these results.
CLINICAL DATA: Shortness of breath and confusion. Abdominal pain.
Fever.

EXAM:
CT ANGIOGRAPHY CHEST
CT ABDOMEN AND PELVIS WITH CONTRAST
TECHNIQUE: Multidetector CT imaging of the chest was performed using the
standard protocol during bolus administration of intravenous
contrast. Multiplanar CT image reconstructions and MIPs were
obtained to evaluate the vascular anatomy. Multidetector CT imaging
of the abdomen and pelvis was performed using the standard protocol
during bolus administration of intravenous contrast.
CONTRAST:  100mL [id number] IOPAMIDOL ([id number]) INJECTION 76%

[Series 7: a/p w/ 5mm · axial · 0.72mm/px · z∈[+940,+1310]mm · 10 of 86 slices shown, 12 images]
[im 6/86  soft-tissue]
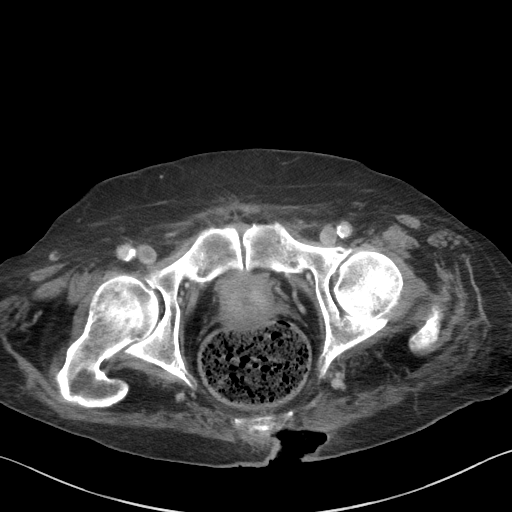
[im 6/86  bone]
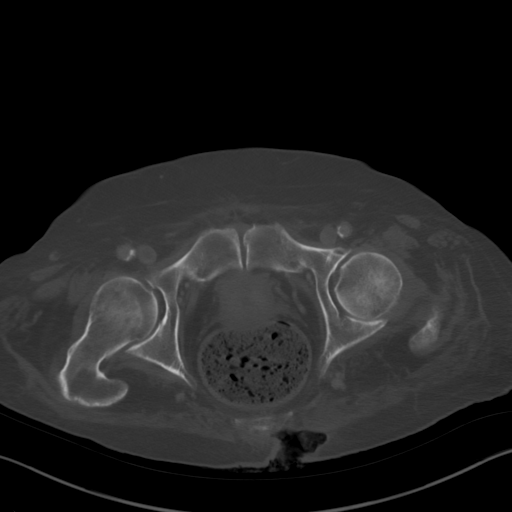
[im 16/86  soft-tissue]
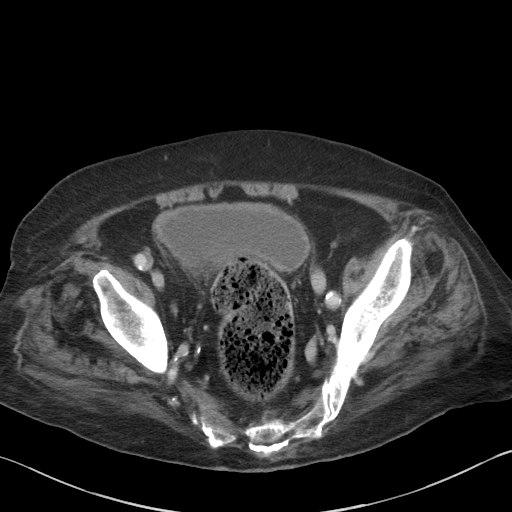
[im 22/86  soft-tissue]
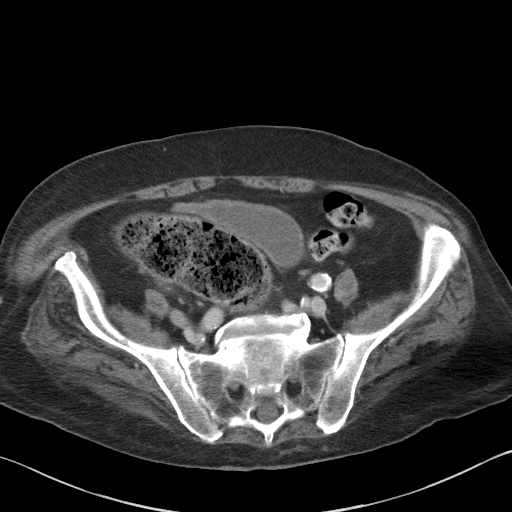
[im 32/86  soft-tissue]
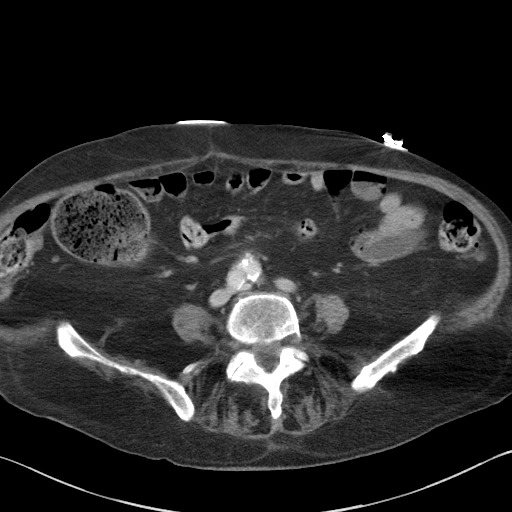
[im 38/86  soft-tissue]
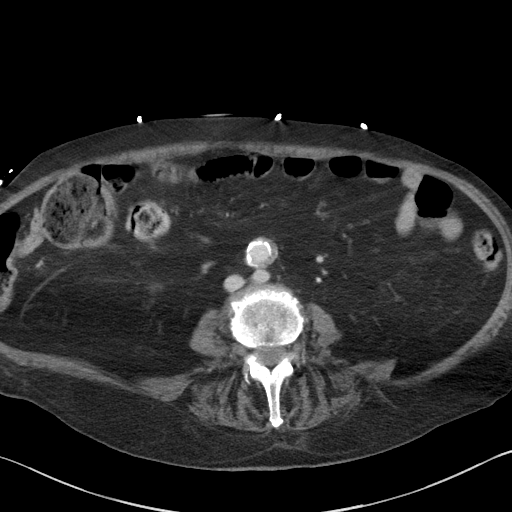
[im 48/86  soft-tissue]
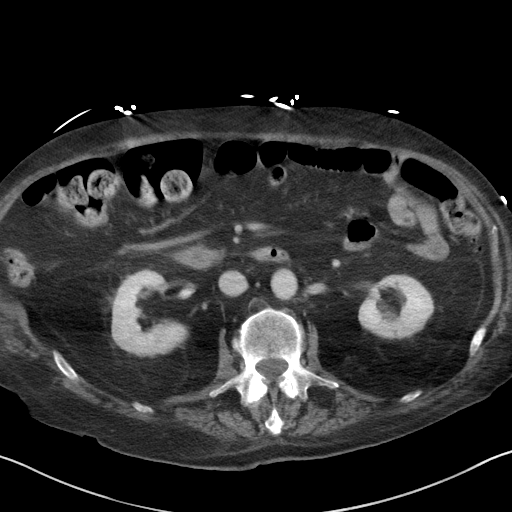
[im 54/86  soft-tissue]
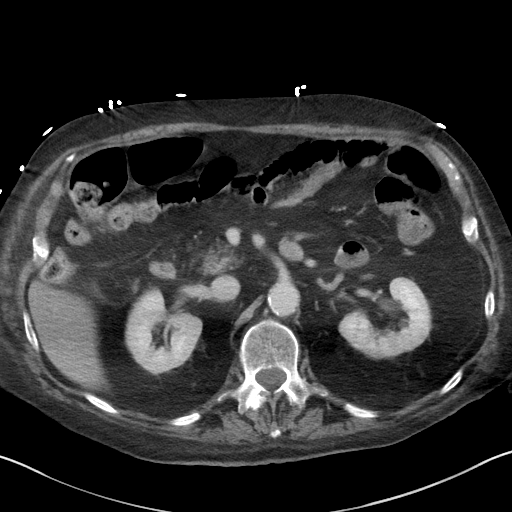
[im 64/86  soft-tissue]
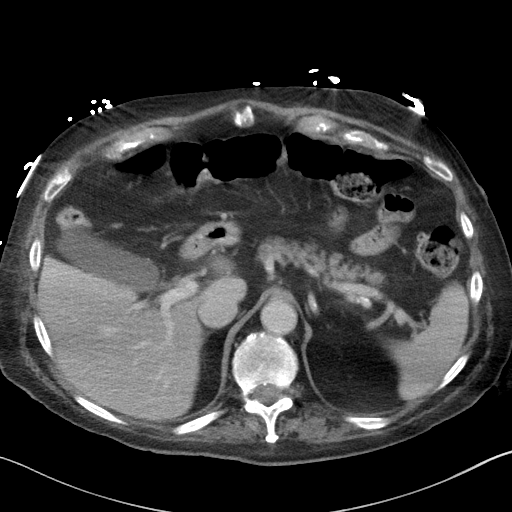
[im 70/86  soft-tissue]
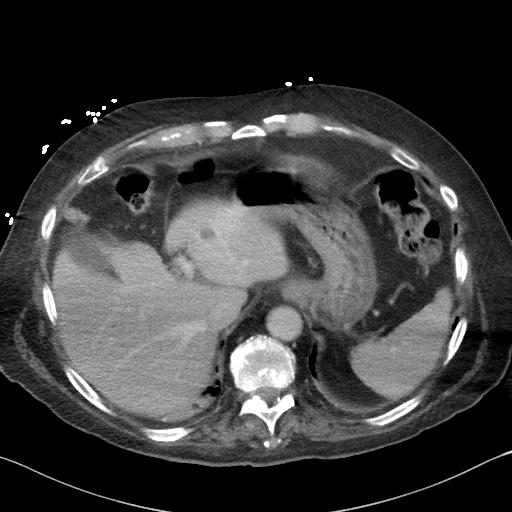
[im 70/86  bone]
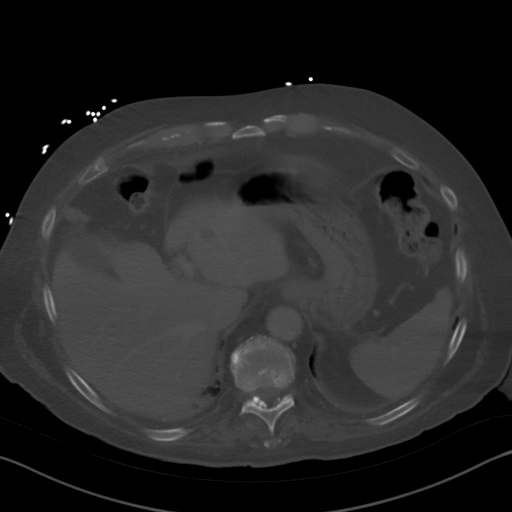
[im 80/86  soft-tissue]
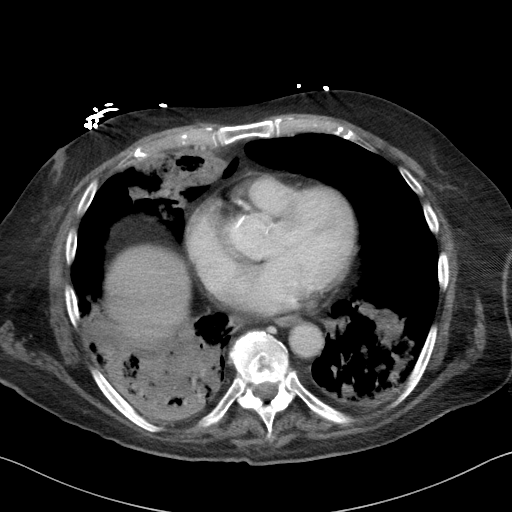

[Series 14: a/p w/ cor · coronal · 0.84mm/px · 3 of 150 slices shown]
[im 38/150  soft-tissue]
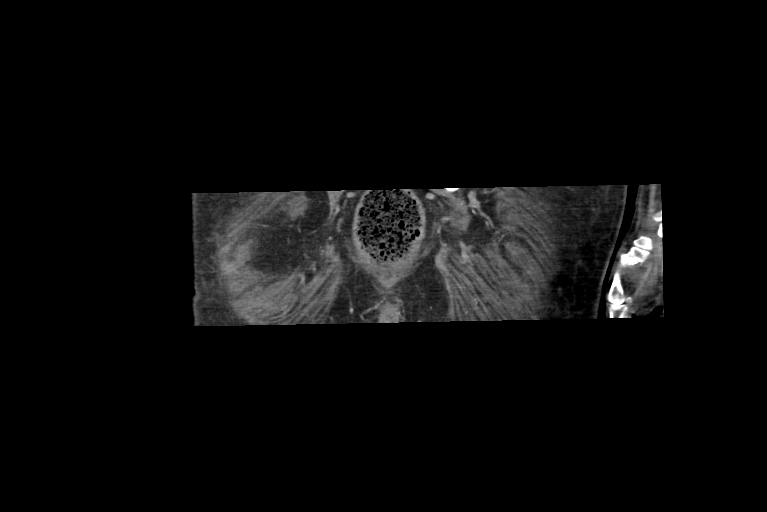
[im 75/150  soft-tissue]
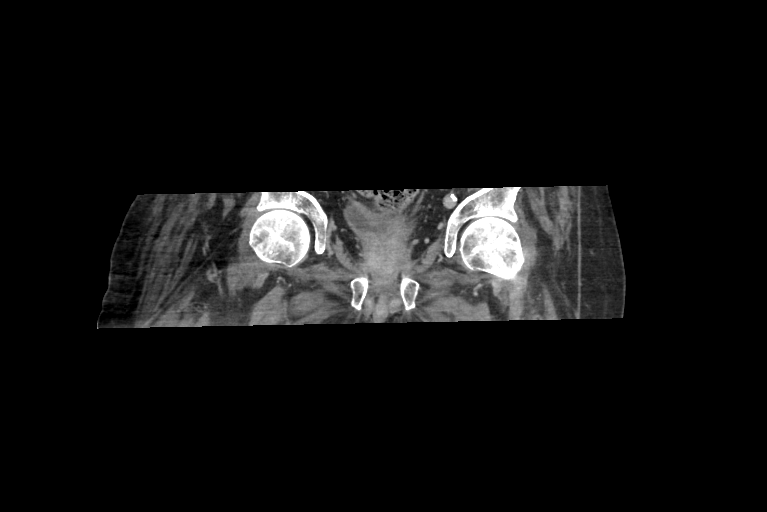
[im 112/150  soft-tissue]
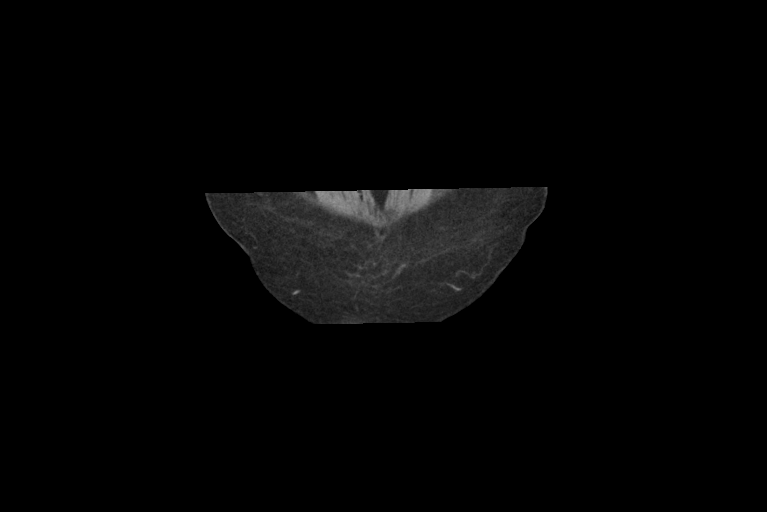

[13 of 46 positions shown; findings below may reference images not displayed]

FINDINGS: CTA CHEST FINDINGS

Cardiovascular: Satisfactory opacification of the pulmonary arteries
to the segmental level. Small peripheral filling defect in a left
lower lobe subsegmental artery (series 3, image 67). No other
evidence of pulmonary emboli. Normal heart size. No pericardial
effusion. Normal caliber thoracic aorta. Coronary, aortic arch, and
branch vessel atherosclerotic vascular disease.

Mediastinum/Nodes: Prominent subcentimeter hilar lymph nodes are
likely reactive. No enlarged mediastinal or axillary lymph nodes.
The thyroid gland trachea, and esophagus demonstrate no significant
abnormalities.

Lungs/Pleura: Dense consolidation in both lower lobes as well as the
right middle lobe, with additional scattered peribronchovascular
nodules and small areas of nodular consolidation throughout both
lungs. No pleural effusion or pneumothorax.

Musculoskeletal: No chest wall abnormality. No acute or significant
osseous findings.

Review of the MIP images confirms the above findings.

CT ABDOMEN and PELVIS FINDINGS

Hepatobiliary: Unchanged subcentimeter hypodense lesion within the
left hepatic lobe, too small to characterize, but likely a cyst. The
gallbladder is unremarkable. No biliary dilatation.

Pancreas: Unremarkable. No pancreatic ductal dilatation or
surrounding inflammatory changes.

Spleen: Normal in size without focal abnormality.

Adrenals/Urinary Tract: Adrenal glands are unremarkable. Kidneys are
normal, without renal calculi, focal lesion, or hydronephrosis.
Bladder is unremarkable.

Stomach/Bowel: Large amount of stool in the rectum and distal
sigmoid colon. Redundant sigmoid colon. Mild left-sided colonic
diverticulosis. The stomach and small bowel are unremarkable. No
evidence of bowel obstruction. Normal appendix.

Vascular/Lymphatic: Focal expansion with internal hypodensity in the
right common femoral vein. Small area of hypodensity within the left
common femoral vein. Aortic atherosclerosis. No lymphadenopathy.

Reproductive: Mild central prostate gland enlargement.

Other: No free fluid or pneumoperitoneum. Bilateral fat containing
inguinal hernias.

Musculoskeletal: No acute or significant osseous findings. Left
paramidline sacral decubitus ulcer extending near the distal sacrum
and coccyx.

Review of the MIP images confirms the above findings.
IMPRESSION: CTA chest:

1. Dense consolidation in both lower lobes as well as the right
middle lobe, with additional scattered peribronchovascular nodules
and small areas of nodular consolidation throughout both lungs,
concerning for multifocal pneumonia and/or aspiration.
2. Small peripheral filling defect in a left lower lobe subsegmental
pulmonary artery, consistent with pulmonary embolus, likely acute
given bilateral common femoral deep venous thrombosis. No additional
pulmonary emboli.

CT abdomen and pelvis:

1. Right greater than left common femoral vein deep venous
thrombosis.
2. Left paramidline sacral decubitus ulcer extending near the distal
sacrum and coccyx without CT evidence of underlying osteomyelitis.
3. Large amount of stool in the rectum and distal sigmoid colon.
Correlate for constipation.
4.  Aortic atherosclerosis ([id number]).

Radiology assistant personnel have been notified to put me in
telephone contact with the referring physician or the referring
physician's clinical representative in order to discuss these
findings. Once this communication is established I will issue an
addendum to this report for documentation purposes.
